# Patient Record
Sex: Female | Born: 1965 | Race: White | Hispanic: No | Marital: Married | Smoking: Never smoker
Health system: Southern US, Community
[De-identification: ages and names within clinical notes are randomized; demographics above are authoritative.]

## PROBLEM LIST (undated history)

## (undated) DIAGNOSIS — R0602 Shortness of breath: Secondary | ICD-10-CM

## (undated) DIAGNOSIS — I10 Essential (primary) hypertension: Secondary | ICD-10-CM

## (undated) DIAGNOSIS — I700 Atherosclerosis of aorta: Secondary | ICD-10-CM

## (undated) DIAGNOSIS — K219 Gastro-esophageal reflux disease without esophagitis: Secondary | ICD-10-CM

## (undated) DIAGNOSIS — M549 Dorsalgia, unspecified: Secondary | ICD-10-CM

## (undated) DIAGNOSIS — F419 Anxiety disorder, unspecified: Secondary | ICD-10-CM

## (undated) DIAGNOSIS — N950 Postmenopausal bleeding: Secondary | ICD-10-CM

## (undated) DIAGNOSIS — R600 Localized edema: Secondary | ICD-10-CM

## (undated) DIAGNOSIS — M25569 Pain in unspecified knee: Secondary | ICD-10-CM

## (undated) DIAGNOSIS — F32A Depression, unspecified: Secondary | ICD-10-CM

## (undated) DIAGNOSIS — K5790 Diverticulosis of intestine, part unspecified, without perforation or abscess without bleeding: Secondary | ICD-10-CM

## (undated) DIAGNOSIS — R0600 Dyspnea, unspecified: Secondary | ICD-10-CM

## (undated) DIAGNOSIS — G4730 Sleep apnea, unspecified: Secondary | ICD-10-CM

## (undated) DIAGNOSIS — C50919 Malignant neoplasm of unspecified site of unspecified female breast: Secondary | ICD-10-CM

## (undated) DIAGNOSIS — K449 Diaphragmatic hernia without obstruction or gangrene: Secondary | ICD-10-CM

## (undated) DIAGNOSIS — E7800 Pure hypercholesterolemia, unspecified: Secondary | ICD-10-CM

## (undated) DIAGNOSIS — K429 Umbilical hernia without obstruction or gangrene: Secondary | ICD-10-CM

## (undated) DIAGNOSIS — K5900 Constipation, unspecified: Secondary | ICD-10-CM

## (undated) DIAGNOSIS — Z6841 Body Mass Index (BMI) 40.0 and over, adult: Secondary | ICD-10-CM

## (undated) DIAGNOSIS — R7303 Prediabetes: Secondary | ICD-10-CM

## (undated) DIAGNOSIS — R4586 Emotional lability: Secondary | ICD-10-CM

## (undated) DIAGNOSIS — Z973 Presence of spectacles and contact lenses: Secondary | ICD-10-CM

## (undated) DIAGNOSIS — M2550 Pain in unspecified joint: Secondary | ICD-10-CM

## (undated) DIAGNOSIS — E559 Vitamin D deficiency, unspecified: Secondary | ICD-10-CM

## (undated) DIAGNOSIS — M25559 Pain in unspecified hip: Secondary | ICD-10-CM

## (undated) DIAGNOSIS — K439 Ventral hernia without obstruction or gangrene: Secondary | ICD-10-CM

## (undated) HISTORY — DX: Diverticulosis of intestine, part unspecified, without perforation or abscess without bleeding: K57.90

## (undated) HISTORY — DX: Emotional lability: R45.86

## (undated) HISTORY — PX: WISDOM TOOTH EXTRACTION: SHX21

## (undated) HISTORY — DX: Atherosclerosis of aorta: I70.0

## (undated) HISTORY — DX: Shortness of breath: R06.02

## (undated) HISTORY — DX: Dorsalgia, unspecified: M54.9

## (undated) HISTORY — DX: Constipation, unspecified: K59.00

## (undated) HISTORY — DX: Diaphragmatic hernia without obstruction or gangrene: K44.9

## (undated) HISTORY — DX: Vitamin D deficiency, unspecified: E55.9

## (undated) HISTORY — DX: Pain in unspecified hip: M25.559

## (undated) HISTORY — DX: Malignant neoplasm of unspecified site of unspecified female breast: C50.919

## (undated) HISTORY — DX: Pain in unspecified joint: M25.50

## (undated) HISTORY — DX: Ventral hernia without obstruction or gangrene: K43.9

## (undated) HISTORY — DX: Pain in unspecified knee: M25.569

## (undated) HISTORY — DX: Depression, unspecified: F32.A

## (undated) HISTORY — PX: COLONOSCOPY: SHX174

## (undated) HISTORY — DX: Localized edema: R60.0

## (undated) HISTORY — DX: Prediabetes: R73.03

## (undated) SURGERY — COLONOSCOPY WITH PROPOFOL
Anesthesia: Monitor Anesthesia Care

---

## 2019-01-13 NOTE — H&P (Addendum)
Chelsea Soto is an 53 y.o. female presents for surgical evaluation of PMB.  No pelvic pain.  No f/c.     Menstrual History: No LMP recorded. - menopause    PMHx:  Hypercholesterolemia, HTN  PSHx:  None  SHx:  No tobacco or etoh use  Social History:  has no history on file for tobacco, alcohol, and drug.  Allergies: No Known Allergies   Meds:  Lisinopril, rosuvastatin  AF, VSS Physical Exam  Gen - NAD Abd - soft, obese, NT PV - limited by habitus.    PV [place]: small intramural fibroids, larges 2.6cm.  EMS 7.6cm.  No adnexal masses   Assessment/Plan:  PMB Hysteroscopy, D&C, possible resection of endometrium R/b/a discussed, questions answered, informed consent  Chelsea Soto 01/13/2019, 8:31 AM

## 2019-01-15 NOTE — Pre-Procedure Instructions (Signed)
[id number] - [place], [place] - [address] [place] [address] Phone: [phone] Fax: [phone]      Your procedure is scheduled on Thursday, October, 22nd.  Report to [facility] Main Entrance "A" at 0530 A.M., and check in at the Admitting office.  Call this number if you have problems the morning of surgery:  [phone]  Call [phone] if you have any questions prior to your surgery date Monday-Friday 8am-4pm    Remember:  Do not eat or drink after midnight the night before your surgery  You may drink clear liquids until 0430 AM the morning of your surgery.   Clear liquids allowed are: Water, Non-Citrus Juices (without pulp), Carbonated Beverages, Clear Tea, Black Coffee Only, and Gatorade  Please complete your PRE-SURGERY ENSURE that was provided to you by 0430 AM the morning of surgery.  Please, if able, drink it in one setting. DO NOT SIP.     Take these medicines the morning of surgery with A SIP OF WATER: NONE.   As of today, STOP taking any Aspirin (unless otherwise instructed by your surgeon), NSAIDs such as, meloxicam (MOBIC), Aleve, Naproxen, Ibuprofen, Motrin, Advil, Goody's, BC's, all herbal medications, fish oil, and all vitamins.    The Morning of Surgery  Do not wear jewelry, make-up or nail polish.  Do not wear lotions, powders, perfumes, or deodorant  Do not shave 48 hours prior to surgery.    Do not bring valuables to the hospital.  [facility] is not responsible for any belongings or valuables.  If you are a smoker, DO NOT Smoke 24 hours prior to surgery IF you wear a CPAP at night please bring your mask, tubing, and machine the morning of surgery   Remember that you must have someone to transport you home after your surgery, and remain with you for 24 hours if you are discharged the same day.   Contacts, glasses, hearing aids, dentures or bridgework may not be worn into  surgery.    Leave your suitcase in the car.  After surgery it may be brought to your room.  For patients admitted to the hospital, discharge time will be determined by your treatment team.  Patients discharged the day of surgery will not be allowed to drive home.    Special instructions:   [place]- Preparing For Surgery  Before surgery, you can play an important role. Because skin is not sterile, your skin needs to be as free of germs as possible. You can reduce the number of germs on your skin by washing with CHG (chlorahexidine gluconate) Soap before surgery.  CHG is an antiseptic cleaner which kills germs and bonds with the skin to continue killing germs even after washing.    Oral Hygiene is also important to reduce your risk of infection.  Remember - BRUSH YOUR TEETH THE MORNING OF SURGERY WITH YOUR REGULAR TOOTHPASTE  Please do not use if you have an allergy to CHG or antibacterial soaps. If your skin becomes reddened/irritated stop using the CHG.  Do not shave (including legs and underarms) for at least 48 hours prior to first CHG shower. It is OK to shave your face.  Please follow these instructions carefully.   1. Shower the NIGHT BEFORE SURGERY and the MORNING OF SURGERY with CHG Soap.   2. If you chose to wash your hair, wash your hair first as usual with your normal shampoo.  3. After you shampoo, rinse your hair and body thoroughly to remove the shampoo.  4. Use CHG as you would any other liquid soap. You can apply CHG directly to the skin and wash gently with a scrungie or a clean washcloth.   5. Apply the CHG Soap to your body ONLY FROM THE NECK DOWN.  Do not use on open wounds or open sores. Avoid contact with your eyes, ears, mouth and genitals (private parts). Wash Face and genitals (private parts)  with your normal soap.   6. Wash thoroughly, paying special attention to the area where your surgery will be performed.  7. Thoroughly rinse your body with warm  water from the neck down.  8. DO NOT shower/wash with your normal soap after using and rinsing off the CHG Soap.  9. Pat yourself dry with a CLEAN TOWEL.  10. Wear CLEAN PAJAMAS to bed the night before surgery, wear comfortable clothes the morning of surgery  11. Place CLEAN SHEETS on your bed the night of your first shower and DO NOT SLEEP WITH PETS.    Day of Surgery:  Do not apply any deodorants/lotions. Please shower the morning of surgery with the CHG soap  Please wear clean clothes to the hospital/surgery center.   Remember to brush your teeth WITH YOUR REGULAR TOOTHPASTE.   Please read over the following fact sheets that you were given.

## 2019-01-18 ENCOUNTER — Other Ambulatory Visit: Payer: Self-pay

## 2019-01-18 ENCOUNTER — Other Ambulatory Visit (HOSPITAL_COMMUNITY)
Admission: RE | Admit: 2019-01-18 | Discharge: 2019-01-18 | Disposition: A | Discharge status: Home / Self Care | Payer: 59 | Source: Ambulatory Visit | Attending: Obstetrics and Gynecology | Admitting: Obstetrics and Gynecology

## 2019-01-18 ENCOUNTER — Encounter (HOSPITAL_COMMUNITY): Payer: Self-pay

## 2019-01-18 ENCOUNTER — Encounter (HOSPITAL_COMMUNITY)
Admission: RE | Admit: 2019-01-18 | Discharge: 2019-01-18 | Disposition: A | Discharge status: Home / Self Care | Payer: 59 | Source: Ambulatory Visit | Attending: Obstetrics and Gynecology | Admitting: Obstetrics and Gynecology

## 2019-01-18 DIAGNOSIS — Z01812 Encounter for preprocedural laboratory examination: Secondary | ICD-10-CM | POA: Insufficient documentation

## 2019-01-18 DIAGNOSIS — Z20828 Contact with and (suspected) exposure to other viral communicable diseases: Secondary | ICD-10-CM | POA: Insufficient documentation

## 2019-01-18 DIAGNOSIS — Z01810 Encounter for preprocedural cardiovascular examination: Secondary | ICD-10-CM | POA: Insufficient documentation

## 2019-01-18 HISTORY — DX: Pure hypercholesterolemia, unspecified: E78.00

## 2019-01-18 HISTORY — DX: Presence of spectacles and contact lenses: Z97.3

## 2019-01-18 HISTORY — DX: Anxiety disorder, unspecified: F41.9

## 2019-01-18 HISTORY — DX: Essential (primary) hypertension: I10

## 2019-01-18 HISTORY — DX: Postmenopausal bleeding: N95.0

## 2019-01-18 HISTORY — DX: Gastro-esophageal reflux disease without esophagitis: K21.9

## 2019-01-18 HISTORY — DX: Sleep apnea, unspecified: G47.30

## 2019-01-18 HISTORY — DX: Body Mass Index (BMI) 40.0 and over, adult: Z684

## 2019-01-18 HISTORY — DX: Umbilical hernia without obstruction or gangrene: K42.9

## 2019-01-18 HISTORY — DX: Dyspnea, unspecified: R06.00

## 2019-01-18 HISTORY — DX: Morbid (severe) obesity due to excess calories: E66.01

## 2019-01-18 LAB — CBC
HCT: 41.3 % (ref 36.0–46.0)
Hemoglobin: 13.1 g/dL (ref 12.0–15.0)
MCH: 27.9 pg (ref 26.0–34.0)
MCHC: 31.7 g/dL (ref 30.0–36.0)
MCV: 88.1 fL (ref 80.0–100.0)
Platelets: 270 10*3/uL (ref 150–400)
RBC: 4.69 MIL/uL (ref 3.87–5.11)
RDW: 14.7 % (ref 11.5–15.5)
WBC: 7.9 10*3/uL (ref 4.0–10.5)
nRBC: 0 % (ref 0.0–0.2)

## 2019-01-18 LAB — BASIC METABOLIC PANEL
Anion gap: 10 (ref 5–15)
BUN: 17 mg/dL (ref 6–20)
CO2: 21 mmol/L — ABNORMAL LOW (ref 22–32)
Calcium: 9.1 mg/dL (ref 8.9–10.3)
Chloride: 107 mmol/L (ref 98–111)
Creatinine, Ser: 0.51 mg/dL (ref 0.44–1.00)
GFR calc Af Amer: >60 mL/min (ref >60)
GFR calc non Af Amer: >60 mL/min (ref >60)
Glucose, Bld: 113 mg/dL — ABNORMAL HIGH (ref 70–99)
Potassium: 4.2 mmol/L (ref 3.5–5.1)
Sodium: 138 mmol/L (ref 135–145)

## 2019-01-18 LAB — TYPE AND SCREEN
ABO/RH(D): O POS
Antibody Screen: NEGATIVE

## 2019-01-18 LAB — ABO/RH: ABO/RH(D): O POS

## 2019-01-18 NOTE — Progress Notes (Signed)
Pt denies any acute cardiopulmonary issues.  Pt denies being under the care of a cardiologist. Pt denies having a cardiac cath but stated that a stress test was performed and an echo "  may have been performed "  at [facility] by Dr. Farley Ly, in [place], [place]. Nurse requested LOV note, stress test, echo, EKG and any cardiac records; nurse awaiting response. Pt denies having an EKG and chest x ray in the last year. Pt denies recent labs. Pt also made aware to stop taking Melatonin MZS Sleep Disorder (Melatonin 3mg -Zinc 8.7 mg-Selenium 50 mcg). Pt verbalized understanding of all pre-op instructions. Pt chart forwarded to PA, Anesthesiology, to review cardiac studies when received.

## 2019-01-18 NOTE — Progress Notes (Signed)
Pt stated that PCP is Dr. Fonnie Jarvis.

## 2019-01-19 LAB — NOVEL CORONAVIRUS, NAA (HOSP ORDER, SEND-OUT TO REF LAB; TAT 18-24 HRS): SARS-CoV-2, NAA: NOT DETECTED

## 2019-01-20 ENCOUNTER — Encounter (HOSPITAL_COMMUNITY): Payer: Self-pay

## 2019-01-20 NOTE — Anesthesia Preprocedure Evaluation (Addendum)
Anesthesia Evaluation  Patient identified by MRN, date of birth, ID band Patient awake    Reviewed: Allergy & Precautions, H&P , NPO status , Patient's Chart, lab work & pertinent test results  Airway Mallampati: IV  TM Distance: >3 FB Neck ROM: Full    Dental no notable dental hx. (+) Teeth Intact, Dental Advisory Given   Pulmonary neg pulmonary ROS, sleep apnea and Continuous Positive Airway Pressure Ventilation ,    Pulmonary exam normal breath sounds clear to auscultation       Cardiovascular Exercise Tolerance: Good hypertension, Pt. on medications negative cardio ROS Normal cardiovascular exam Rhythm:Regular Rate:Normal     Neuro/Psych negative neurological ROS  negative psych ROS   GI/Hepatic negative GI ROS, Neg liver ROS, GERD  Medicated,  Endo/Other  negative endocrine ROSMorbid obesity  Renal/GU negative Renal ROS  negative genitourinary   Musculoskeletal negative musculoskeletal ROS (+)   Abdominal (+) + obese,   Peds negative pediatric ROS (+)  Hematology negative hematology ROS (+)   Anesthesia Other Findings   Reproductive/Obstetrics negative OB ROS                           Anesthesia Physical Anesthesia Plan  ASA: III  Anesthesia Plan: General   Post-op Pain Management:    Induction: Intravenous  PONV Risk Score and Plan: 3 and Ondansetron, Dexamethasone, Treatment may vary due to age or medical condition and TIVA  Airway Management Planned: LMA, Oral ETT and Video Laryngoscope Planned  Additional Equipment:   Intra-op Plan:   Post-operative Plan:   Informed Consent: I have reviewed the patients History and Physical, chart, labs and discussed the procedure including the risks, benefits and alternatives for the proposed anesthesia with the patient or authorized representative who has indicated his/her understanding and acceptance.       Plan Discussed  with: CRNA, Surgeon and Anesthesiologist  Anesthesia Plan Comments: (Severe OSA in BiPAP, followed by pulmonology at [facility].  Pt reported hx of stress test, possibly echo that were done in [place]. Records were requested from [facility] via fax and phone, as of yet no records have been received.  Preop labs reviewed, WNL. EKG 01/18/19 shows NSR, rate 78.  )    Anesthesia Quick Evaluation

## 2019-01-21 ENCOUNTER — Other Ambulatory Visit: Payer: Self-pay

## 2019-01-21 ENCOUNTER — Ambulatory Visit (HOSPITAL_COMMUNITY): Payer: 59 | Admitting: Anesthesiology

## 2019-01-21 ENCOUNTER — Ambulatory Visit (HOSPITAL_COMMUNITY)
Admission: RE | Admit: 2019-01-21 | Discharge: 2019-01-21 | Disposition: A | Discharge status: Home / Self Care | Payer: 59 | Attending: Obstetrics and Gynecology | Admitting: Obstetrics and Gynecology

## 2019-01-21 ENCOUNTER — Encounter (HOSPITAL_COMMUNITY): Payer: Self-pay

## 2019-01-21 ENCOUNTER — Encounter (HOSPITAL_COMMUNITY)
Admission: RE | Disposition: A | Discharge status: Home / Self Care | Payer: Self-pay | Source: Home / Self Care | Attending: Obstetrics and Gynecology

## 2019-01-21 ENCOUNTER — Ambulatory Visit (HOSPITAL_COMMUNITY): Payer: 59 | Admitting: Physician Assistant

## 2019-01-21 DIAGNOSIS — E669 Obesity, unspecified: Secondary | ICD-10-CM | POA: Insufficient documentation

## 2019-01-21 DIAGNOSIS — N840 Polyp of corpus uteri: Secondary | ICD-10-CM | POA: Insufficient documentation

## 2019-01-21 DIAGNOSIS — D251 Intramural leiomyoma of uterus: Secondary | ICD-10-CM | POA: Diagnosis not present

## 2019-01-21 DIAGNOSIS — Z6841 Body Mass Index (BMI) 40.0 and over, adult: Secondary | ICD-10-CM | POA: Diagnosis not present

## 2019-01-21 DIAGNOSIS — I10 Essential (primary) hypertension: Secondary | ICD-10-CM | POA: Insufficient documentation

## 2019-01-21 DIAGNOSIS — E7800 Pure hypercholesterolemia, unspecified: Secondary | ICD-10-CM | POA: Diagnosis not present

## 2019-01-21 DIAGNOSIS — N950 Postmenopausal bleeding: Secondary | ICD-10-CM | POA: Insufficient documentation

## 2019-01-21 DIAGNOSIS — G4730 Sleep apnea, unspecified: Secondary | ICD-10-CM | POA: Diagnosis not present

## 2019-01-21 DIAGNOSIS — K219 Gastro-esophageal reflux disease without esophagitis: Secondary | ICD-10-CM | POA: Insufficient documentation

## 2019-01-21 DIAGNOSIS — Z79899 Other long term (current) drug therapy: Secondary | ICD-10-CM | POA: Diagnosis not present

## 2019-01-21 HISTORY — PX: DILATATION & CURETTAGE/HYSTEROSCOPY WITH MYOSURE: SHX6511

## 2019-01-21 SURGERY — DILATATION & CURETTAGE/HYSTEROSCOPY WITH MYOSURE
Anesthesia: General | Site: Vagina

## 2019-01-21 MED ORDER — MIDAZOLAM HCL 2 MG/2ML IJ SOLN
INTRAMUSCULAR | Status: AC
  Filled 2019-01-21: qty 2

## 2019-01-21 MED ORDER — MEPERIDINE HCL 25 MG/ML IJ SOLN: 6.2500 mg | INTRAMUSCULAR | Status: DC | PRN

## 2019-01-21 MED ORDER — OXYCODONE HCL 5 MG PO TABS: 5.0000 mg | ORAL_TABLET | Freq: Once | ORAL | Status: DC | PRN

## 2019-01-21 MED ORDER — ONDANSETRON HCL 4 MG/2ML IJ SOLN: 4.0000 mg | Freq: Once | INTRAMUSCULAR | Status: DC | PRN

## 2019-01-21 MED ORDER — LIDOCAINE HCL 1 % IJ SOLN
INTRAMUSCULAR | Status: AC
  Filled 2019-01-21: qty 20

## 2019-01-21 MED ORDER — ACETAMINOPHEN 10 MG/ML IV SOLN
INTRAVENOUS | Status: AC
  Filled 2019-01-21: qty 100

## 2019-01-21 MED ORDER — LIDOCAINE 2% (20 MG/ML) 5 ML SYRINGE
INTRAMUSCULAR | Status: AC
  Filled 2019-01-21: qty 5

## 2019-01-21 MED ORDER — DEXTROSE 5 % IV SOLN
INTRAVENOUS | Status: DC | PRN
  Administered 2019-01-21: 3 g via INTRAVENOUS

## 2019-01-21 MED ORDER — PROPOFOL 10 MG/ML IV BOLUS
INTRAVENOUS | Status: AC
  Filled 2019-01-21: qty 20

## 2019-01-21 MED ORDER — LACTATED RINGERS IV SOLN
INTRAVENOUS | Status: DC
  Administered 2019-01-21: 07:00:00 via INTRAVENOUS

## 2019-01-21 MED ORDER — FENTANYL CITRATE (PF) 250 MCG/5ML IJ SOLN
INTRAMUSCULAR | Status: AC
  Filled 2019-01-21: qty 5

## 2019-01-21 MED ORDER — PROPOFOL 10 MG/ML IV BOLUS
INTRAVENOUS | Status: DC | PRN
  Administered 2019-01-21: 200 mg via INTRAVENOUS

## 2019-01-21 MED ORDER — ACETAMINOPHEN 160 MG/5ML PO SOLN: 325.0000 mg | ORAL | Status: DC | PRN

## 2019-01-21 MED ORDER — ONDANSETRON HCL 4 MG/2ML IJ SOLN
INTRAMUSCULAR | Status: AC
  Filled 2019-01-21: qty 2

## 2019-01-21 MED ORDER — ONDANSETRON HCL 4 MG/2ML IJ SOLN
INTRAMUSCULAR | Status: DC | PRN
  Administered 2019-01-21: 4 mg via INTRAVENOUS

## 2019-01-21 MED ORDER — DEXAMETHASONE SODIUM PHOSPHATE 10 MG/ML IJ SOLN
INTRAMUSCULAR | Status: AC
  Filled 2019-01-21: qty 1

## 2019-01-21 MED ORDER — FENTANYL CITRATE (PF) 100 MCG/2ML IJ SOLN
INTRAMUSCULAR | Status: DC | PRN
  Administered 2019-01-21 (×4): 25 ug via INTRAVENOUS

## 2019-01-21 MED ORDER — ACETAMINOPHEN 325 MG PO TABS: 325.0000 mg | ORAL_TABLET | ORAL | Status: DC | PRN

## 2019-01-21 MED ORDER — OXYCODONE HCL 5 MG/5ML PO SOLN: 5.0000 mg | Freq: Once | ORAL | Status: DC | PRN

## 2019-01-21 MED ORDER — ACETAMINOPHEN 10 MG/ML IV SOLN
INTRAVENOUS | Status: DC | PRN
  Administered 2019-01-21: 1000 mg via INTRAVENOUS

## 2019-01-21 MED ORDER — LIDOCAINE HCL 1 % IJ SOLN
INTRAMUSCULAR | Status: DC | PRN
  Administered 2019-01-21: 10 mL

## 2019-01-21 MED ORDER — FENTANYL CITRATE (PF) 100 MCG/2ML IJ SOLN: 25.0000 ug | INTRAMUSCULAR | Status: DC | PRN

## 2019-01-21 MED ORDER — OXYCODONE-ACETAMINOPHEN 5-325 MG PO TABS: 1.0000 | ORAL_TABLET | ORAL | 0 refills | Status: DC | PRN

## 2019-01-21 MED ORDER — SODIUM CHLORIDE 0.9 % IR SOLN
Status: DC | PRN
  Administered 2019-01-21: 3000 mL

## 2019-01-21 MED ORDER — PHENYLEPHRINE HCL (PRESSORS) 10 MG/ML IV SOLN
INTRAVENOUS | Status: DC | PRN
  Administered 2019-01-21 (×3): 80 ug via INTRAVENOUS

## 2019-01-21 MED ORDER — LIDOCAINE 2% (20 MG/ML) 5 ML SYRINGE
INTRAMUSCULAR | Status: DC | PRN
  Administered 2019-01-21: 100 mg via INTRAVENOUS

## 2019-01-21 SURGICAL SUPPLY — 15 items
CANISTER SUCT 3000ML PPV (MISCELLANEOUS) ×2 IMPLANT
CATH ROBINSON RED A/P 16FR (CATHETERS) ×2 IMPLANT
DEVICE MYOSURE LITE (MISCELLANEOUS) ×2 IMPLANT
DEVICE MYOSURE REACH (MISCELLANEOUS) IMPLANT
GLOVE BIO SURGEON STRL SZ 6.5 (GLOVE) ×2 IMPLANT
GLOVE BIOGEL PI IND STRL 7.0 (GLOVE) ×2 IMPLANT
GLOVE BIOGEL PI INDICATOR 7.0 (GLOVE) ×2
GOWN STRL REUS W/ TWL LRG LVL3 (GOWN DISPOSABLE) ×2 IMPLANT
GOWN STRL REUS W/TWL LRG LVL3 (GOWN DISPOSABLE) ×2
KIT PROCEDURE FLUENT (KITS) ×2 IMPLANT
KIT TURNOVER KIT B (KITS) ×2 IMPLANT
PACK VAGINAL MINOR WOMEN LF (CUSTOM PROCEDURE TRAY) ×2 IMPLANT
PAD OB MATERNITY 4.3X12.25 (PERSONAL CARE ITEMS) ×2 IMPLANT
SEAL ROD LENS SCOPE MYOSURE (ABLATOR) ×2 IMPLANT
TOWEL GREEN STERILE FF (TOWEL DISPOSABLE) ×4 IMPLANT

## 2019-01-21 NOTE — Anesthesia Postprocedure Evaluation (Signed)
Anesthesia Post Note  Patient: Chelsea Soto  Procedure(s) Performed: DILATATION & CURETTAGE/HYSTEROSCOPY WITH possible  MYOSURE (N/A Vagina )     Patient location during evaluation: PACU Anesthesia Type: General Level of consciousness: awake and alert Pain management: pain level controlled Vital Signs Assessment: post-procedure vital signs reviewed and stable Respiratory status: spontaneous breathing, nonlabored ventilation, respiratory function stable and patient connected to nasal cannula oxygen Cardiovascular status: blood pressure returned to baseline and stable Postop Assessment: no apparent nausea or vomiting Anesthetic complications: no    Last Vitals:  Vitals:   01/21/19 0836 01/21/19 0837  BP: (!) 121/50   Pulse: 69 68  Resp: 20 16  Temp:  36.7 C  SpO2: 94% 96%    Last Pain:  Vitals:   01/21/19 0837  PainSc: 0-No pain                 Anneliese Leblond

## 2019-01-21 NOTE — Anesthesia Procedure Notes (Signed)
Procedure Name: LMA Insertion Date/Time: 01/21/2019 7:28 AM Performed by: Amadeo Garnet, CRNA Pre-anesthesia Checklist: Patient identified, Emergency Drugs available, Suction available, Patient being monitored and Timeout performed Patient Re-evaluated:Patient Re-evaluated prior to induction Oxygen Delivery Method: Circle system utilized Preoxygenation: Pre-oxygenation with 100% oxygen Induction Type: IV induction Ventilation: Mask ventilation without difficulty LMA: LMA inserted LMA Size: 4.0 Number of attempts: 1 Placement Confirmation: ETT inserted through vocal cords under direct vision,  positive ETCO2 and breath sounds checked- equal and bilateral Dental Injury: Teeth and Oropharynx as per pre-operative assessment

## 2019-01-21 NOTE — Transfer of Care (Signed)
Immediate Anesthesia Transfer of Care Note  Patient: Chelsea Soto  Procedure(s) Performed: DILATATION & CURETTAGE/HYSTEROSCOPY WITH possible  MYOSURE (N/A Vagina )  Patient Location: PACU  Anesthesia Type:General  Level of Consciousness: awake, alert  and oriented  Airway & Oxygen Therapy: Patient Spontanous Breathing  Post-op Assessment: Report given to RN, Post -op Vital signs reviewed and stable and Patient moving all extremities  Post vital signs: Reviewed and stable  Last Vitals:  Vitals Value Taken Time  BP 111/61 01/21/19 0806  Temp    Pulse 74 01/21/19 0807  Resp 22 01/21/19 0807  SpO2 96 % 01/21/19 0807  Vitals shown include unvalidated device data.  Last Pain:  Vitals:   01/21/19 0606  PainSc: 0-No pain         Complications: No apparent anesthesia complications

## 2019-01-21 NOTE — Op Note (Signed)
Pre op dx: postmenopausal bleeding, endometrial mass  Post op dx:  same  Procedure:  Paracervical block, Hysteroscopy, D&C, polypectomy  Surgeon:  [name], MD  EBL:  minimal  Specimen:  EMC, endometrial polyp  Anesthesia:  GETA, local  Complications:  none  Condition:  Pt was taken to the OR after informed consent.  Anesthesia was given and she was placed in dorsal lithotomy position.  Prepped and draped in sterile condition.  Bivalve speculum placed in vaginal and single tooth tenaculum placed on anterior lip of cervix.  10 cc lidocaine used for block.  Cervix was serially dilated with pratt dilators.  Diagnostic hysteroscope inserted and survey of intrauterine cavity performed.  2 polypoid masses noted.  Myosure was used to resect polyps.  Hysteroscope removed and a gentle curetting performed.  Specimen placed on telfa and passed off to be sent to pathology.    Sponge, needle, and instrument counts correct.

## 2019-01-22 ENCOUNTER — Encounter (HOSPITAL_COMMUNITY): Payer: Self-pay | Admitting: Obstetrics and Gynecology

## 2019-01-22 LAB — SURGICAL PATHOLOGY

## 2019-04-30 LAB — COLOGUARD: COLOGUARD: NEGATIVE

## 2020-06-02 ENCOUNTER — Telehealth: Payer: Self-pay | Admitting: Allergy & Immunology

## 2020-06-02 ENCOUNTER — Other Ambulatory Visit (HOSPITAL_COMMUNITY): Payer: Self-pay | Admitting: Allergy & Immunology

## 2020-06-02 MED ORDER — MOLNUPIRAVIR EUA 200MG CAPSULE: 4.0000 | ORAL_CAPSULE | Freq: Two times a day (BID) | ORAL | 0 refills | Status: AC

## 2020-06-02 MED ORDER — MOLNUPIRAVIR EUA 200MG CAPSULE: 4.0000 | ORAL_CAPSULE | Freq: Two times a day (BID) | ORAL | 0 refills | Status: DC

## 2020-06-02 NOTE — Telephone Encounter (Signed)
I received a call from Ms. Bennison reporting that she was exposed to Oliver. Given her high risk status, I recommended that she start molnupiravir treatment. I am sending this to the [facility]   Salvatore Marvel, MD [facility] [place] [facility] [place]

## 2020-06-27 ENCOUNTER — Ambulatory Visit (INDEPENDENT_AMBULATORY_CARE_PROVIDER_SITE_OTHER): Payer: 59 | Admitting: Family Medicine

## 2020-06-27 ENCOUNTER — Encounter (INDEPENDENT_AMBULATORY_CARE_PROVIDER_SITE_OTHER): Payer: Self-pay | Admitting: Family Medicine

## 2020-06-27 ENCOUNTER — Other Ambulatory Visit: Payer: Self-pay

## 2020-06-27 VITALS — BP 122/80 | HR 72 | Temp 98.0°F | Ht 63.0 in | Wt 342.0 lb

## 2020-06-27 DIAGNOSIS — Z9189 Other specified personal risk factors, not elsewhere classified: Secondary | ICD-10-CM | POA: Diagnosis not present

## 2020-06-27 DIAGNOSIS — E7849 Other hyperlipidemia: Secondary | ICD-10-CM

## 2020-06-27 DIAGNOSIS — Z1331 Encounter for screening for depression: Secondary | ICD-10-CM

## 2020-06-27 DIAGNOSIS — R7303 Prediabetes: Secondary | ICD-10-CM

## 2020-06-27 DIAGNOSIS — R0602 Shortness of breath: Secondary | ICD-10-CM

## 2020-06-27 DIAGNOSIS — E559 Vitamin D deficiency, unspecified: Secondary | ICD-10-CM | POA: Diagnosis not present

## 2020-06-27 DIAGNOSIS — I10 Essential (primary) hypertension: Secondary | ICD-10-CM

## 2020-06-27 DIAGNOSIS — Z0289 Encounter for other administrative examinations: Secondary | ICD-10-CM

## 2020-06-27 DIAGNOSIS — R5383 Other fatigue: Secondary | ICD-10-CM | POA: Diagnosis not present

## 2020-06-27 DIAGNOSIS — G4733 Obstructive sleep apnea (adult) (pediatric): Secondary | ICD-10-CM

## 2020-06-27 DIAGNOSIS — Z6841 Body Mass Index (BMI) 40.0 and over, adult: Secondary | ICD-10-CM

## 2020-06-27 DIAGNOSIS — F39 Unspecified mood [affective] disorder: Secondary | ICD-10-CM
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
## 2020-06-28 ENCOUNTER — Telehealth (INDEPENDENT_AMBULATORY_CARE_PROVIDER_SITE_OTHER): Payer: 59 | Admitting: Psychology

## 2020-06-28 DIAGNOSIS — F3289 Other specified depressive episodes: Secondary | ICD-10-CM

## 2020-06-28 LAB — COMPREHENSIVE METABOLIC PANEL
ALT: 21 IU/L (ref 0–32)
AST: 18 IU/L (ref 0–40)
Albumin/Globulin Ratio: 1.4 (ref 1.2–2.2)
Albumin: 4.2 g/dL (ref 3.8–4.9)
Alkaline Phosphatase: 119 IU/L (ref 44–121)
BUN/Creatinine Ratio: 18 (ref 9–23)
BUN: 11 mg/dL (ref 6–24)
Bilirubin Total: 0.4 mg/dL (ref 0.0–1.2)
CO2: 25 mmol/L (ref 20–29)
Calcium: 9.4 mg/dL (ref 8.7–10.2)
Chloride: 99 mmol/L (ref 96–106)
Creatinine, Ser: 0.6 mg/dL (ref 0.57–1.00)
Globulin, Total: 3.1 g/dL (ref 1.5–4.5)
Glucose: 98 mg/dL (ref 65–99)
Potassium: 4.2 mmol/L (ref 3.5–5.2)
Sodium: 140 mmol/L (ref 134–144)
Total Protein: 7.3 g/dL (ref 6.0–8.5)
eGFR: 105 mL/min/{1.73_m2} (ref >59)

## 2020-06-28 LAB — CBC WITH DIFFERENTIAL/PLATELET
Basophils Absolute: 0.1 10*3/uL (ref 0.0–0.2)
Basos: 1 %
EOS (ABSOLUTE): 0.3 10*3/uL (ref 0.0–0.4)
Eos: 4 %
Hematocrit: 41.1 % (ref 34.0–46.6)
Hemoglobin: 13.5 g/dL (ref 11.1–15.9)
Immature Grans (Abs): 0 10*3/uL (ref 0.0–0.1)
Immature Granulocytes: 0 %
Lymphocytes Absolute: 3 10*3/uL (ref 0.7–3.1)
Lymphs: 36 %
MCH: 27.8 pg (ref 26.6–33.0)
MCHC: 32.8 g/dL (ref 31.5–35.7)
MCV: 85 fL (ref 79–97)
Monocytes Absolute: 0.5 10*3/uL (ref 0.1–0.9)
Monocytes: 6 %
Neutrophils Absolute: 4.4 10*3/uL (ref 1.4–7.0)
Neutrophils: 53 %
Platelets: 337 10*3/uL (ref 150–450)
RBC: 4.86 x10E6/uL (ref 3.77–5.28)
RDW: 13.8 % (ref 11.7–15.4)
WBC: 8.3 10*3/uL (ref 3.4–10.8)

## 2020-06-28 LAB — VITAMIN D 25 HYDROXY (VIT D DEFICIENCY, FRACTURES): Vit D, 25-Hydroxy: 53.3 ng/mL (ref 30.0–100.0)

## 2020-06-28 LAB — VITAMIN B12: Vitamin B-12: 324 pg/mL (ref 232–1245)

## 2020-06-28 LAB — LIPID PANEL
Chol/HDL Ratio: 3.9 ratio (ref 0.0–4.4)
Cholesterol, Total: 148 mg/dL (ref 100–199)
HDL: 38 mg/dL — ABNORMAL LOW (ref >39)
LDL Chol Calc (NIH): 85 mg/dL (ref 0–99)
Triglycerides: 141 mg/dL (ref 0–149)
VLDL Cholesterol Cal: 25 mg/dL (ref 5–40)

## 2020-06-28 LAB — INSULIN, RANDOM: INSULIN: 19.4 u[IU]/mL (ref 2.6–24.9)

## 2020-06-28 LAB — T4, FREE: Free T4: 0.94 ng/dL (ref 0.82–1.77)

## 2020-06-28 LAB — HEMOGLOBIN A1C
Est. average glucose Bld gHb Est-mCnc: 131 mg/dL
Hgb A1c MFr Bld: 6.2 % — ABNORMAL HIGH (ref 4.8–5.6)

## 2020-06-28 LAB — TSH: TSH: 3.38 u[IU]/mL (ref 0.450–4.500)

## 2020-06-28 LAB — FOLATE: Folate: 7 ng/mL (ref >3.0)

## 2020-06-29 NOTE — Progress Notes (Signed)
  Office: [phone]  /  Fax: [phone]    Date: July 13, 2020   Appointment Start Time: 8:00am Duration: 30 minutes Provider: Glennie Isle, Psy.D. 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 female presenting for a follow-up appointment to address the previously established treatment goal of increasing coping skills. Today's appointment was a telepsychological visit due to COVID-19. Chelsea Soto provided verbal consent for today's telepsychological appointment and she is aware she is responsible for securing confidentiality on her end of the session. Prior to proceeding with today's appointment, [name]'s physical location at the time of this appointment was obtained as well a phone number she could be reached at in the event of technical difficulties. Ariely and this provider participated in today's telepsychological service. Of note, Chelsea Soto provided verbal consent at 8:03am to proceed via a regular telephone call due to challenges with video connection and then audio connection.   This provider conducted a brief check-in. [name] shared about recent events, noting some deviations from the meal plan while traveling for work. While at home, she discussed focusing on what she needed versus focusing on everyone else, which reportedly helped her follow the structured meal plan. Positive reinforcement was provided. Psychoeducation regarding triggers for emotional eating was provided. Chelsea Soto was provided a handout, and encouraged to utilize the handout between now and the next appointment to increase awareness of triggers and frequency. Chelsea Soto agreed. This provider also discussed behavioral strategies for specific triggers, such as placing the utensil down when conversing to avoid mindless eating. Chelsea Soto  provided verbal consent during today's appointment for this provider to send a handout about triggers via e-mail.  Chelsea Soto was receptive to today's appointment as evidenced by openness to sharing, responsiveness to feedback, and willingness to explore triggers for emotional eating.  Mental Status Examination:  Appearance: unable to assess  Behavior: appropriate to circumstances Mood: euthymic Affect: unable to fully assess Speech: normal in rate, volume, and tone Eye Contact: unable to assess Psychomotor Activity: unable to assess  Gait: unable to assess Thought Process: linear, logical, and goal directed  Thought Content/Perception: no hallucinations, delusions, bizarre thinking or behavior reported or observed and no evidence of suicidal and homicidal ideation, plan, and intent Orientation: time, person, place, and purpose of appointment Memory/Concentration: memory, attention, language, and fund of knowledge intact  Insight/Judgment: good  Interventions:  Conducted a brief chart review Provided empathic reflections and validation Provided positive reinforcement Employed supportive psychotherapy interventions to facilitate reduced distress and to improve coping skills with identified stressors Psychoeducation provided regarding triggers for emotional eating  DSM-5 Diagnosis(es): 311 (F32.8) Other Specified Depressive Disorder, Emotional Eating Behaviors  Treatment Goal & Progress: During the initial appointment with this provider, the following treatment goal was established: increase coping skills. [name] has demonstrated some progress in her goal as evidenced by increased awareness of hunger patterns.   Plan: The next appointment will be scheduled in two weeks, which will be via MyChart Video Visit. The next session will focus on working towards the established treatment goal. Additionally, [name] reported a plan to return Chelsea Soto phone call to schedule an  initial appointment.

## 2020-07-05 NOTE — Progress Notes (Signed)
Chief Complaint:   OBESITY Chelsea Soto (MR# [id number]) is a 55 y.o. female who presents for evaluation and treatment of obesity and related comorbidities. Current BMI is Body mass index is 60.58 kg/m. Eydie has been struggling with her weight for many years and has been unsuccessful in either losing weight, maintaining weight loss, or reaching her healthy weight goal.  Preet is currently in the action stage of change and ready to dedicate time achieving and maintaining a healthier weight. Zamariah is interested in becoming our patient and working on intensive lifestyle modifications including (but not limited to) diet and exercise for weight loss.  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  She wants to get to 150 pounds "ASAP".  She did Weight Watchers in the past and says it helped, but says "she quit".  Craves carbs, especially fried bread, pasta, and potatoes, especially at lunch.  Snacks on chips, cheese, crackers, and grapes.  Drinks a lot of caloric beverages.  Eats out a lot.  She had CMP, CBC, and A1c on 04/27/2020.  Lakesia's habits were reviewed today and are as follows: Her family eats meals together, she thinks her family will eat healthier with her, her desired weight loss is 194 pounds, she has been heavy most of her life, she started gaining excessive weight 5 years ago, her heaviest weight ever was 344 pounds, she craves bread, pasta, and potatoes, she snacks frequently in the evenings, she is frequently drinking liquids with calories, she frequently makes poor food choices, she has problems with excessive hunger, she frequently eats larger portions than normal and she struggles with emotional eating.  Depression Screen Danita's Food and Mood (modified PHQ-9) score was 23.  Depression screen [facility] 2/9 06/27/2020  Decreased Interest 3  Down, Depressed, Hopeless 3  PHQ - 2 Score 6  Altered sleeping  3  Tired, decreased energy 3  Change in appetite 3  Feeling bad or failure about yourself  3  Trouble concentrating 3  Moving slowly or fidgety/restless 1  Suicidal thoughts 1  PHQ-9 Score 23  Difficult doing work/chores Very difficult   Assessment/Plan:   Orders Placed This Encounter  Procedures  . Vitamin B12  . CBC with Differential/Platelet  . Comprehensive metabolic panel  . Folate  . Hemoglobin A1c  . Insulin, random  . Lipid panel  . T4, free  . TSH  . VITAMIN D 25 Hydroxy (Vit-D Deficiency, Fractures)    Medications Discontinued During This Encounter  Medication Reason  . clotrimazole-betamethasone (LOTRISONE) cream   . oxyCODONE-acetaminophen (PERCOCET) 5-325 MG tablet      1. Other fatigue Amilah denies daytime somnolence and denies waking up still tired. Alaija generally gets 5 or 6 hours of sleep per night, and states that she has generally restful sleep. Snoring is not present. Apneic episodes are present. Epworth Sleepiness Score is 12.  Ellaree has OSA and uses a BiPAP.  Rodolfo does feel that her weight is causing her energy to be lower than it should be. Fatigue may be related to obesity, depression or many other causes. Labs will be ordered, and in the meanwhile, Arieonna will focus on self care including making healthy food choices, increasing physical activity and focusing on stress reduction.  Will check labs today.  - Vitamin B12 - CBC with Differential/Platelet - Folate - T4, free - TSH  2. SOBOE (shortness of breath on exertion) Caren Griffins notes increasing shortness  of breath with exercising and seems to be worsening over time with weight gain. She notes getting out of breath sooner with activity than she used to. This has gotten worse recently. Iszabella denies shortness of breath at rest or orthopnea.  Ange does feel that she gets out of breath more easily that she used to when she exercises. Zakara's shortness of breath appears to be obesity  related and exercise induced. She has agreed to work on weight loss and gradually increase exercise to treat her exercise induced shortness of breath. Will continue to monitor closely.  Will check IC today.  3. Prediabetes Not at goal. Goal is HgbA1c < 5.7.  Medication: Victoza.  She was started on Victoza in August/September 2021.  A1c was 6.4 on 11/15/2019.  She has not lost any weight since starting Victoza.    Plan:  She will continue to focus on protein-rich, low simple carbohydrate foods. We reviewed the importance of hydration, regular exercise for stress reduction, and restorative sleep.  Check labs today.  Start prudent nutritional plan and weight loss.  Continue Victoza per PCP for now.  May change in the future.  - Comprehensive metabolic panel - Hemoglobin A1c - Insulin, random  4. Essential hypertension At goal. Medications: Zestoretic 20-25 mg daily.   Plan: Avoid buying foods that are: processed, frozen, or prepackaged to avoid excess salt. We will watch for signs of hypotension as she continues lifestyle modifications. We will continue to monitor closely alongside her PCP and/or Specialist.  Regular follow up with PCP and specialists was also encouraged.   BP Readings from Last 3 Encounters:  07/11/20 113/76  06/27/20 122/80  01/21/19 (!) 121/50   Lab Results  Component Value Date   CREATININE 0.60 06/27/2020   5. Other hyperlipidemia Course: Controlled. Lipid-lowering medications: Crestor 20 mg daily.   Plan: Dietary changes: Increase soluble fiber, decrease simple carbohydrates, decrease saturated fat. Exercise changes: Moderate to vigorous-intensity aerobic activity 150 minutes per week or as tolerated. We will continue to monitor along with PCP/specialists as it pertains to her weight loss journey.  Lab Results  Component Value Date   CHOL 148 06/27/2020   HDL 38 (L) 06/27/2020   LDLCALC 85 06/27/2020   TRIG 141 06/27/2020   CHOLHDL 3.9 06/27/2020   Lab  Results  Component Value Date   ALT 21 06/27/2020   AST 18 06/27/2020   ALKPHOS 119 06/27/2020   BILITOT 0.4 06/27/2020   The 10-year ASCVD risk score Mikey Bussing DC Jr., et al., 2013) is: 2.4%   Values used to calculate the score:     Age: 53 years     Sex: Female     Is Non-Hispanic African American: No     Diabetic: No     Tobacco smoker: No     Systolic Blood Pressure: 161 mmHg     Is BP treated: Yes     HDL Cholesterol: 38 mg/dL     Total Cholesterol: 148 mg/dL  - Lipid panel  6. OSA treated with BiPAP ESS is 12.  She sees a sleep medicine doctor in [place].  OSA is a cause of systemic hypertension and is associated with an increased incidence of stroke, heart failure, atrial fibrillation, and coronary heart disease. Severe OSA increases all-cause mortality and  cardiovascular mortality.  Not controlled per screening questionnaire.  Goal: Treatment of OSA via CPAP compliance and weight loss. . Plasma ghrelin levels (appetite or "hunger hormone") are significantly higher in OSA patients than in BMI-matched  controls, but decrease to levels similar to those of obese patients without OSA after CPAP treatment.  . Weight loss improves OSA by several mechanisms, including reduction in fatty tissue in the throat (i.e. parapharyngeal fat) and the tongue. Loss of abdominal fat increases mediastinal traction on the upper airway making it less likely to collapse during sleep. . Studies have also shown that compliance with CPAP treatment improves leptin (hunger inhibitory hormone) imbalance.  7. Vitamin D deficiency She is taking OTC vitamin D 5,000 IU daily.  Optimal goal > 50 ng/dL.   Plan: Continue current OTC vitamin D supplementation.  Will check vitamin D level today.  - VITAMIN D 25 Hydroxy (Vit-D Deficiency, Fractures)  8. Mood disorder, with emotional eating Not at goal. Medication: None.  With GAD and depression.  Appears well adjusted.  "Successful in life."  PHQ-9 is 23.   Denies SI.  Positive family history of mood disorder.  She sees a Social worker every 1-2 weeks.  Plan:  Behavior modification techniques were discussed today to help deal with emotional/non-hunger eating behaviors. Patient was referred to Dr. Mallie Mussel, our Bariatric Psychologist, for evaluation due to her elevated PHQ-9 score and significant struggles with emotional eating.  She will continue with her personal counselor.  9. Depression screening Ladashia was screened for depression as part of her new patient workup today.  PHQ-9 is 23.  Shontavia had a positive depression screening. Depression is commonly associated with obesity and often results in emotional eating behaviors. We will monitor this closely and work on CBT to help improve the non-hunger eating patterns. Referral to Psychology may be required if no improvement is seen as she continues in our clinic.  10. At risk for heart disease Due to Geralene's current state of health and medical condition(s), she is at a higher risk for heart disease.  This puts the patient at much greater risk to subsequently develop cardiopulmonary conditions that can significantly affect patient's quality of life in a negative manner.    At least 23 minutes were spent on counseling Aubrianna about these concerns today, and I stressed the importance of reversing risks factors of obesity, especially truncal and visceral fat, hypertension, hyperlipidemia, and pre-diabetes.  The initial goal is to lose at least 5-10% of starting weight to help reduce these risk factors.  Counseling:  Intensive lifestyle modifications were discussed with Nya as the most appropriate first line of treatment.  she will continue to work on diet, exercise, and weight loss efforts.  We will continue to reassess these conditions on a fairly regular basis in an attempt to decrease the patient's overall morbidity and mortality.  Evidence-based interventions for health behavior change were utilized  today including the discussion of self monitoring techniques, problem-solving barriers, and SMART goal setting techniques.  Specifically, regarding patient's less desirable eating habits and patterns, we employed the technique of small changes when Katilin has not been able to fully commit to her prudent nutritional plan.  11. Class 3 severe obesity with serious comorbidity and body mass index (BMI) of 60.0 to 69.9 in adult, unspecified obesity type (HCC)  Nichol is currently in the action stage of change and her goal is to continue with weight loss efforts. I recommend Harlee begin the structured treatment plan as follows:  She has agreed to the Category 2 Plan.  Exercise goals: As is.   Behavioral modification strategies: increasing lean protein intake, decreasing simple carbohydrates, increasing water intake, decreasing liquid calories, meal planning and cooking strategies and planning for  success.  She was informed of the importance of frequent follow-up visits to maximize her success with intensive lifestyle modifications for her multiple health conditions. She was informed we would discuss her lab results at her next visit unless there is a critical issue that needs to be addressed sooner. Marilee agreed to keep her next visit at the agreed upon time to discuss these results.  Objective:   Blood pressure 122/80, pulse 72, temperature 98 F (36.7 C), height 5\' 3"  (1.6 m), weight (!) 342 lb (155.1 kg), SpO2 96 %. Body mass index is 60.58 kg/m.  Indirect Calorimeter completed today shows a VO2 of 210 and a REE of 1461.  Her calculated basal metabolic rate is 1941 thus her basal metabolic rate is worse than expected.  General: Cooperative, alert, well developed, in no acute distress. HEENT: Conjunctivae and lids unremarkable. Cardiovascular: Regular rhythm.  Lungs: Normal work of breathing. Neurologic: No focal deficits.   Lab Results  Component Value Date   CREATININE 0.60  06/27/2020   BUN 11 06/27/2020   NA 140 06/27/2020   K 4.2 06/27/2020   CL 99 06/27/2020   CO2 25 06/27/2020   Lab Results  Component Value Date   ALT 21 06/27/2020   AST 18 06/27/2020   ALKPHOS 119 06/27/2020   BILITOT 0.4 06/27/2020   Lab Results  Component Value Date   HGBA1C 6.2 (H) 06/27/2020   Lab Results  Component Value Date   INSULIN 19.4 06/27/2020   Lab Results  Component Value Date   TSH 3.380 06/27/2020   Lab Results  Component Value Date   CHOL 148 06/27/2020   HDL 38 (L) 06/27/2020   LDLCALC 85 06/27/2020   TRIG 141 06/27/2020   CHOLHDL 3.9 06/27/2020   Lab Results  Component Value Date   WBC 8.3 06/27/2020   HGB 13.5 06/27/2020   HCT 41.1 06/27/2020   MCV 85 06/27/2020   PLT 337 06/27/2020   Attestation Statements:   This is the patient's first visit at [facility]. The patient's NEW PATIENT PACKET was reviewed at length. Included in the packet: current and past health history, medications, allergies, ROS, gynecologic history (women only), surgical history, family history, social history, weight history, weight loss surgery history (for those that have had weight loss surgery), nutritional evaluation, mood and food questionnaire, PHQ9, Epworth questionnaire, sleep habits questionnaire, patient life and health improvement goals questionnaire. These will all be scanned into the patient's chart under media.   During the visit, I independently reviewed the patient's EKG, bioimpedance scale results, and indirect calorimeter results. I used this information to tailor a meal plan for the patient that will help her to lose weight and will improve her obesity-related conditions going forward. I performed a medically necessary appropriate examination and/or evaluation. I discussed the assessment and treatment plan with the patient. The patient was provided an opportunity to ask questions and all were answered. The patient agreed with the plan and  demonstrated an understanding of the instructions. Labs were ordered at this visit and will be reviewed at the next visit unless more critical results need to be addressed immediately. Clinical information was updated and documented in the EMR.   I, Water quality scientist, CMA, am acting as Location manager for [employer], DO.  I have reviewed the above documentation for accuracy and completeness, and I agree with the above. Marjory Sneddon, D.O.  The [place] was signed into law in 2016 which includes the  topic of electronic health records.  This provides immediate access to information in MyChart.  This includes consultation notes, operative notes, office notes, lab results and pathology reports.  If you have any questions about what you read please let us know at your next visit so we can discuss your concerns and take corrective action if need be.  We are right here with you.

## 2020-07-11 ENCOUNTER — Encounter (INDEPENDENT_AMBULATORY_CARE_PROVIDER_SITE_OTHER): Payer: Self-pay | Admitting: Family Medicine

## 2020-07-11 ENCOUNTER — Ambulatory Visit (INDEPENDENT_AMBULATORY_CARE_PROVIDER_SITE_OTHER): Payer: 59 | Admitting: Family Medicine

## 2020-07-11 ENCOUNTER — Other Ambulatory Visit: Payer: Self-pay

## 2020-07-11 VITALS — BP 113/76 | Temp 98.1°F | Ht 63.0 in | Wt 336.0 lb

## 2020-07-11 DIAGNOSIS — E7849 Other hyperlipidemia: Secondary | ICD-10-CM | POA: Diagnosis not present

## 2020-07-11 DIAGNOSIS — E559 Vitamin D deficiency, unspecified: Secondary | ICD-10-CM

## 2020-07-11 DIAGNOSIS — R7303 Prediabetes: Secondary | ICD-10-CM

## 2020-07-11 DIAGNOSIS — I10 Essential (primary) hypertension: Secondary | ICD-10-CM | POA: Diagnosis not present

## 2020-07-11 DIAGNOSIS — E66813 Obesity, class 3: Secondary | ICD-10-CM

## 2020-07-11 DIAGNOSIS — Z6841 Body Mass Index (BMI) 40.0 and over, adult: Secondary | ICD-10-CM

## 2020-07-13 ENCOUNTER — Telehealth (INDEPENDENT_AMBULATORY_CARE_PROVIDER_SITE_OTHER): Payer: 59 | Admitting: Psychology

## 2020-07-13 DIAGNOSIS — F3289 Other specified depressive episodes: Secondary | ICD-10-CM

## 2020-07-13 NOTE — Progress Notes (Signed)
Office: [phone]  /  Fax: [phone]    Date: July 25, 2020   Appointment Start Time: 7:59am Duration: 29 minutes Provider: Glennie Isle, Psy.D. Type of Session: Individual Therapy  Location of Patient: Home Location of Provider: Provider's Home (private office) Type of Contact: Telepsychological Visit via MyChart Video Visit  Session Content: Chelsea Soto is a 55 y.o. female presenting for a follow-up appointment to address the previously established treatment goal of increasing coping skills. Today's appointment was a telepsychological visit due to COVID-19. Chelsea Soto provided verbal consent for today's telepsychological appointment and she is aware she is responsible for securing confidentiality on her end of the session. Prior to proceeding with today's appointment, Chelsea Soto's physical location at the time of this appointment was obtained as well a phone number she could be reached at in the event of technical difficulties. Chelsea Soto and this provider participated in today's telepsychological service. Of note, Chelsea Soto provided verbal consent to proceed via telephone due to continued technical issues via MyChart Video Visit on her end.   This provider conducted a brief check-in. Chelsea Soto shared she has been sick, adding she has been eating chicken noodle soup. She was encouraged to focus on feeling better and focusing on protein intake when possible; she agreed. She noted she has e-visit with primary care today.  Reviewed triggers for emotional eating. [name] acknowledged eating in the evenings while watching the news due to "despair and boredom." She was engaged in problem solving to help cope with the aforementioned. Chelsea Soto agreed to limiting news consumption to 30 minutes a day and planning for a snack. Psychoeducation regarding mindfulness was provided. A handout was provided to Chelsea Soto with further information regarding mindfulness, including exercises. This provider also explained the  benefit of mindfulness as it relates to emotional eating. [name] was encouraged to engage in the provided exercises between now and the next appointment with this provider. Chelsea Soto agreed. During today's appointment, Chelsea Soto was led through a mindfulness exercise involving her senses. Chelsea Soto provided verbal consent during today's appointment for this provider to send a handout about mindfulness via e-mail. Chelsea Soto was receptive to today's appointment as evidenced by openness to sharing, responsiveness to feedback, and willingness to implement discussed strategies .  Mental Status Examination:  Appearance: unable to assess  Behavior: appropriate to circumstances Mood: euthymic Affect: unable to fully assess Speech: normal in rate, volume, and tone Eye Contact: unable to assess Psychomotor Activity: unable to assess  Gait: unable to assess Thought Process: linear, logical, and goal directed  Thought Content/Perception: no hallucinations, delusions, bizarre thinking or behavior reported or observed and no evidence or endorsement of suicidal and homicidal ideation, plan, and intent Orientation: time, person, place, and purpose of appointment Memory/Concentration: memory, attention, language, and fund of knowledge intact  Insight/Judgment: good  Interventions:  Conducted a brief chart review Provided empathic reflections and validation Reviewed content from the previous session Employed supportive psychotherapy interventions to facilitate reduced distress and to improve coping skills with identified stressors Engaged patient in problem solving Psychoeducation provided regarding mindfulness Engaged patient in mindfulness exercise(s)  DSM-5 Diagnosis(es): 311 (F32.8) Other Specified Depressive Disorder, Emotional Eating Behaviors  Treatment Goal & Progress: During the initial appointment with this provider, the following treatment goal was established: increase coping skills. Chelsea Soto has  demonstrated progress in her goal as evidenced by increased awareness of hunger patterns and increased awareness of triggers for emotional eating. Chelsea Soto also demonstrates willingness to engage in mindfulness exercises.  Plan: Due to [name]'s upcoming vacation, the next appointment will  be scheduled in three weeks, which will be via MyChart Video Visit. The next session will focus on working towards the established treatment goal and termination planning. Additionally, [name] agreed to call [name] back today.

## 2020-07-14 NOTE — Progress Notes (Signed)
Chief Complaint:   OBESITY Chelsea Soto is here to discuss her progress with her obesity treatment plan along with follow-up of her obesity related diagnoses. Chelsea Soto is on the Category 2 Plan and states she is following her eating plan approximately 50% of the time. Chelsea Soto states she is not currently exercising.  Today's visit was #: 2 Starting weight: 342 lbs Starting date: 06/27/2020 Today's weight: 336 lbs Today's date: 07/11/2020 Total lbs lost to date: 6 Total lbs lost since last in-office visit: 6  Interim History: Chelsea Soto voices that she was concerned about her travel last week because she wen [employer] and drank some cocktails and ate out. She feels she could have done better. Chelsea Soto is difficult because stress of the day has mounted up and sometimes her husband wants typical foods. She has no travel for the next few weeks.  Subjective:   1. Essential hypertension Jiah's BP is very well controlled today. She is on Zestoretic. Pt denies chest pain, chest pressure and headache.  2. Other hyperlipidemia  Chelsea Soto is on Crestor 20 mg. Her HDL 38 and triglycerides 141. She denies myalgias or transaminitis.  3. Pre-diabetes Ladonne's last A1c 6.2 and insulin level 19.4. She is on Victoza 1.2 mg daily, however she wasn't taking it consistently prior to last appointment.  4. Vitamin D deficiency Chelsea Soto is on Vit D 5,000 IU BID. She reports fatigue.  Assessment/Plan:   1. Essential hypertension Chelsea Soto is working on healthy weight loss and exercise to improve blood pressure control. We will watch for signs of hypotension as she continues her lifestyle modifications. Continue current meds with no change in dose. Follow up BP at next appt- may want to consider decreasing dose.  2. Other hyperlipidemia Cardiovascular risk and specific lipid/LDL goals reviewed.  We discussed several lifestyle modifications today and Chelsea Soto will continue to work on diet, exercise and weight loss  efforts. Orders and follow up as documented in patient record. Follow up labs in 3 months.  Counseling Intensive lifestyle modifications are the first line treatment for this issue. . Dietary changes: Increase soluble fiber. Decrease simple carbohydrates. . Exercise changes: Moderate to vigorous-intensity aerobic activity 150 minutes per week if tolerated. . Lipid-lowering medications: see documented in medical record.  3. Pre-diabetes Chelsea Soto will continue to work on weight loss, exercise, and decreasing simple carbohydrates to help decrease the risk of diabetes. Repeat labs in 3 months. Continue Victoza with no change in dose.  4. Vitamin D deficiency Low Vitamin D level contributes to fatigue and are associated with obesity, breast, and colon cancer. She agrees to continue to take OTC Vitamin D @5 ,000 IU BID and will follow-up for routine testing of Vitamin D, at least 2-3 times per year to avoid over-replacement.  5. Class 3 severe obesity with serious comorbidity and body mass index (BMI) of 60.0 to 69.9 in adult, unspecified obesity type (HCC) Chelsea Soto is currently in the action stage of change. As such, her goal is to continue with weight loss efforts. She has agreed to the Category 2 Plan.   Exercise goals: No exercise has been prescribed at this time.  Behavioral modification strategies: increasing lean protein intake, meal planning and cooking strategies and travel eating strategies.  Chelsea Soto has agreed to follow-up with our clinic in 2 weeks with Dr. Raliegh Scarlet. She was informed of the importance of frequent follow-up visits to maximize her success with intensive lifestyle modifications for her multiple health conditions.   Objective:   Blood pressure 113/76, temperature  98.1 F (36.7 C), height 5\' 3"  (1.6 m), weight (!) 336 lb (152.4 kg). Body mass index is 59.52 kg/m.  General: Cooperative, alert, well developed, in no acute distress. HEENT: Conjunctivae and lids  unremarkable. Cardiovascular: Regular rhythm.  Lungs: Normal work of breathing. Neurologic: No focal deficits.   Lab Results  Component Value Date   CREATININE 0.60 06/27/2020   BUN 11 06/27/2020   NA 140 06/27/2020   K 4.2 06/27/2020   CL 99 06/27/2020   CO2 25 06/27/2020   Lab Results  Component Value Date   ALT 21 06/27/2020   AST 18 06/27/2020   ALKPHOS 119 06/27/2020   BILITOT 0.4 06/27/2020   Lab Results  Component Value Date   HGBA1C 6.2 (H) 06/27/2020   Lab Results  Component Value Date   INSULIN 19.4 06/27/2020   Lab Results  Component Value Date   TSH 3.380 06/27/2020   Lab Results  Component Value Date   CHOL 148 06/27/2020   HDL 38 (L) 06/27/2020   LDLCALC 85 06/27/2020   TRIG 141 06/27/2020   CHOLHDL 3.9 06/27/2020   Lab Results  Component Value Date   WBC 8.3 06/27/2020   HGB 13.5 06/27/2020   HCT 41.1 06/27/2020   MCV 85 06/27/2020   PLT 337 06/27/2020    Attestation Statements:   Reviewed by clinician on day of visit: allergies, medications, problem list, medical history, surgical history, family history, social history, and previous encounter notes.  Coral Ceo, am acting as transcriptionist for Coralie Common, MD.   I have reviewed the above documentation for accuracy and completeness, and I agree with the above. - Jinny Blossom, MD

## 2020-07-25 ENCOUNTER — Telehealth (INDEPENDENT_AMBULATORY_CARE_PROVIDER_SITE_OTHER): Payer: 59 | Admitting: Psychology

## 2020-07-25 ENCOUNTER — Ambulatory Visit (INDEPENDENT_AMBULATORY_CARE_PROVIDER_SITE_OTHER): Payer: 59 | Admitting: Family Medicine

## 2020-07-25 DIAGNOSIS — F3289 Other specified depressive episodes: Secondary | ICD-10-CM | POA: Diagnosis not present

## 2020-08-01 NOTE — Progress Notes (Signed)
Office: [phone]  /  Fax: [phone]    Date: Aug 15, 2020   Appointment Start Time: 8:05am Duration: 25 minutes Provider: Glennie Isle, Psy.D. Type of Session: Individual Therapy  Location of Patient: Home Location of Provider: Provider's Home (private office) Type of Contact: Telepsychological Visit via MyChart Video Visit  Session Content: This provider called Chelsea Soto at 8:02am as MyChart Video Visit stated she joined; however, there were no audio and video capabilities on [name]'s end. Assistance was provided. Due to ongoing issues, [name] provided verbal consent to proceed via telephone. As such, today's appointment was initiated 5 minutes late. Chelsea Soto is a 55 y.o. female presenting for a follow-up appointment to address the previously established treatment goal of increasing coping skills. Today's appointment was a telepsychological visit due to COVID-19. [name] provided verbal consent for today's telepsychological appointment and she is aware she is responsible for securing confidentiality on her end of the session. Prior to proceeding with today's appointment, [name]'s physical location at the time of this appointment was obtained as well a phone number she could be reached at in the event of technical difficulties. [name] and this provider participated in today's telepsychological service.   This provider conducted a brief check-in. Chelsea Soto shared engaging in emotional eating behaviors, adding she feels she does not have a routine. She discussed she also is experiencing sleep issues resulting in night time eating, noting she uses a bypap. However, she stated she does not use her machine when she is unable to sleep and moves to other parts of the home. Chelsea Soto noted she is seeing her sleep doctor on Aug 21, 2020. Due to ongoing sleeping difficulties, psychoeducation regarding sleep hygiene and the impact of poor sleep on eating habits (i.e., the role of ghrelin and leptin) was  provided. Chelsea Soto provided verbal consent during today's appointment for this provider to send a handout with sleep hygiene strategies via e-mail. Overall, Chelsea Soto was receptive to today's appointment as evidenced by openness to sharing, responsiveness to feedback, and willingness to implement discussed strategies .  Mental Status Examination:  Appearance: unable to assess  Behavior: appropriate to circumstances Mood: euthymic Affect: unable to fully assess Speech: normal in rate, volume, and tone Eye Contact: unable to assess Psychomotor Activity: unable to assess  Gait: unable to assess Thought Process: linear, logical, and goal directed  Thought Content/Perception: no hallucinations, delusions, bizarre thinking or behavior reported or observed and no evidence or endorsement of suicidal and homicidal ideation, plan, and intent Orientation: time, person, place, and purpose of appointment Memory/Concentration: memory, attention, language, and fund of knowledge intact  Insight/Judgment: fair  Interventions:  Conducted a brief chart review Provided empathic reflections and validation Employed supportive psychotherapy interventions to facilitate reduced distress and to improve coping skills with identified stressors Psychoeducation provided regarding sleep hygiene Recommended/discussed option for longer-term therapeutic services  DSM-5 Diagnosis(es): F32.89 Other Specified Depressive Disorder, Emotional Eating Behaviors  Treatment Goal & Progress: During the initial appointment with this provider, the following treatment goal was established: increase coping skills. [name] has demonstrated progress in her goal as evidenced by increased awareness of hunger patterns and increased awareness of triggers for emotional eating. [name] also continues to demonstrate willingness to engage in learned skill(s).  Plan: Per [name]'s request due to upcoming trips, the next appointment will be scheduled  in one month, which will be via MyChart Video Visit. The next session will focus on working towards the established treatment goal. Additionally, the phone number for [place] was provided again,  and [name] agreed to call to schedule an initial appointment.

## 2020-08-08 ENCOUNTER — Encounter (INDEPENDENT_AMBULATORY_CARE_PROVIDER_SITE_OTHER): Payer: Self-pay | Admitting: Family Medicine

## 2020-08-08 ENCOUNTER — Ambulatory Visit (INDEPENDENT_AMBULATORY_CARE_PROVIDER_SITE_OTHER): Payer: 59 | Admitting: Family Medicine

## 2020-08-08 ENCOUNTER — Other Ambulatory Visit: Payer: Self-pay

## 2020-08-08 VITALS — BP 115/79 | HR 63 | Temp 98.1°F | Ht 63.0 in | Wt 336.0 lb

## 2020-08-08 DIAGNOSIS — R7303 Prediabetes: Secondary | ICD-10-CM

## 2020-08-08 DIAGNOSIS — R748 Abnormal levels of other serum enzymes: Secondary | ICD-10-CM | POA: Diagnosis not present

## 2020-08-08 DIAGNOSIS — B369 Superficial mycosis, unspecified: Secondary | ICD-10-CM

## 2020-08-08 DIAGNOSIS — F329 Major depressive disorder, single episode, unspecified: Secondary | ICD-10-CM

## 2020-08-08 DIAGNOSIS — Z6841 Body Mass Index (BMI) 40.0 and over, adult: Secondary | ICD-10-CM

## 2020-08-08 DIAGNOSIS — Z9189 Other specified personal risk factors, not elsewhere classified: Secondary | ICD-10-CM | POA: Diagnosis not present

## 2020-08-08 DIAGNOSIS — F32A Depression, unspecified: Secondary | ICD-10-CM

## 2020-08-08 MED ORDER — TERBINAFINE HCL 1 % EX CREA: TOPICAL_CREAM | CUTANEOUS | 1 refills | Status: DC

## 2020-08-08 MED ORDER — NYSTATIN POWD: 0 refills | Status: DC

## 2020-08-15 ENCOUNTER — Telehealth (INDEPENDENT_AMBULATORY_CARE_PROVIDER_SITE_OTHER): Payer: 59 | Admitting: Psychology

## 2020-08-15 DIAGNOSIS — F3289 Other specified depressive episodes: Secondary | ICD-10-CM | POA: Diagnosis not present

## 2020-08-15 NOTE — Progress Notes (Signed)
Chief Complaint:   OBESITY Chelsea Soto is here to discuss her progress with her obesity treatment plan along with follow-up of her obesity related diagnoses.   Today's visit was #: 3 Starting weight: 342 lbs Starting date: 06/27/2020 Today's weight: 336 lbs Today's date: 08/08/2020 Weight change since last visit: 0 Total lbs lost to date: 6 lbs Body mass index is 59.52 kg/m.  Total weight loss percentage to date: -1.75%  Interim History:  Chelsea Soto has not been into the clinic for 1 month.  She has not been following her plan at all.  She has had increased stress in her life and tells me she has not been putting herself first or making herself a priority.  She wants to eat better and be healthier.  Denies specific concerns or issues with the meal plan.  Current Meal Plan: the Category 2 Plan for 10% of the time.  Current Exercise Plan: Going to the gym for 60 minutes once per week. Current Anti-Obesity Medications: none     Side effects: None.  Assessment/Plan:   Meds ordered this encounter  Medications  . terbinafine (LAMISIL) 1 % cream    Sig: Apply to affected area twice daily until rash gone, then apply 2 more days.    Dispense:  30 g    Refill:  1  . Nystatin POWD    Sig: Apply liberally to affected area 2 times per day    Dispense:  1 each    Refill:  0    1 jar please     1. Prediabetes Not at goal. Goal is HgbA1c < 5.7.  Medication: Victoza 1.2 mg subcutaneously daily.  A1c 6.2.  Off Victoza because she fell out of the habit of taking it with life schedule.  She got too busy.  Plan:  Discussed labs with patient today.  She will continue to focus on protein-rich, low simple carbohydrate foods. We reviewed the importance of hydration, regular exercise for stress reduction, and restorative sleep.  - Restart Victoza for help with weight loss which she was on prior.  Prudent nutritional plan, weight loss, and will follow.  Lab Results  Component Value Date   HGBA1C 6.2  (H) 06/27/2020   Lab Results  Component Value Date   INSULIN 19.4 06/27/2020     2. Low serum HDL Course: Uncontrolled.  Lipid-lowering medications: Crestor 20 mg daily.   Plan:  Discussed labs with patient today.  Increase exercise.  Follow prudent nutritional plan.  Dietary changes: Increase soluble fiber, decrease simple carbohydrates, decrease saturated fat. Exercise changes: Moderate to vigorous-intensity aerobic activity 150 minutes per week or as tolerated. We will continue to monitor along with PCP/specialists as it pertains to her weight loss journey.  Lab Results  Component Value Date   CHOL 148 06/27/2020   HDL 38 (L) 06/27/2020   LDLCALC 85 06/27/2020   TRIG 141 06/27/2020   CHOLHDL 3.9 06/27/2020   Lab Results  Component Value Date   ALT 21 06/27/2020   AST 18 06/27/2020   ALKPHOS 119 06/27/2020   BILITOT 0.4 06/27/2020   The 10-year ASCVD risk score Mikey Bussing DC Jr., et al., 2013) is: 2.5%   Values used to calculate the score:     Age: 54 years     Sex: Female     Is Non-Hispanic African American: No     Diabetic: No     Tobacco smoker: No     Systolic Blood Pressure: 025 mmHg  Is BP treated: Yes     HDL Cholesterol: 38 mg/dL     Total Cholesterol: 148 mg/dL    3. Fungal dermatitis She started developing a rash under her breasts and between her legs with working out.  Red, irritated and moist areas.  She has used cream and powder in the past and requests medication again.  Plan:  Counseled her on likely fungal infection from warm and damp region.  Keep dry with Nystatin powder.  Apply twice daily to affected areas (1 jar) no refills.  Script given.  Use OTC terbinafine cream 1% to affected areas.  If needed, will prescribe stronger strength.  Keep dry and open to air as much as possible.  - Start terbinafine (LAMISIL) 1 % cream; Apply to affected area twice daily until rash gone, then apply 2 more days.  Dispense: 30 g; Refill: 1 - Start Nystatin POWD;  Apply liberally to affected area 2 times per day  Dispense: 1 each; Refill: 0    4. Depressive disorder, with emotional eating Improving, but not optimized.  Mood improving, especially with working with Dr. Mallie Mussel, which she finds helpful.  Plan:  Discussed labs with patient today.  Continue with Dr. Mallie Mussel and medication management per PCP (Xanax).  Increase walking/yoga/meditation, etc. for stress management.    5. At risk for diabetes mellitus - Stormi was given diabetes prevention education and counseling today of more than 9 minutes.  - Counseled patient on pathophysiology of disease and meaning/ implication of lab results.  - Reviewed how certain foods can either stimulate or inhibit insulin release, and subsequently affect hunger pathways  - Importance of following a healthy meal plan with limiting amounts of simple carbohydrates discussed with patient - Effects of regular aerobic exercise on blood sugar regulation reviewed and encouraged an eventual goal of 30 min 5d/week or more as a minimum.  - Briefly discussed treatment options, which always include dietary and lifestyle modification as first line.   - Handouts provided at patient's desire and/or told to go online to the American Diabetes Association website for further information.    6. Obesity, current BMI 59.6  Course: Chelsea Soto is currently in the action stage of change. As such, her goal is to continue with weight loss efforts.   Nutrition goals: She has agreed to the Category 2 Plan.   Exercise goals: As is.  Behavioral modification strategies: meal planning and cooking strategies and planning for success.  Justa has agreed to follow-up with our clinic in 2-3 weeks. She was informed of the importance of frequent follow-up visits to maximize her success with intensive lifestyle modifications for her multiple health conditions.   Objective:   Blood pressure 115/79, pulse 63, temperature 98.1 F (36.7 C), height  5\' 3"  (1.6 m), weight (!) 336 lb (152.4 kg), SpO2 97 %. Body mass index is 59.52 kg/m.  General: Cooperative, alert, well developed, in no acute distress. HEENT: Conjunctivae and lids unremarkable. Cardiovascular: Regular rhythm.  Lungs: Normal work of breathing. Neurologic: No focal deficits.   Lab Results  Component Value Date   CREATININE 0.60 06/27/2020   BUN 11 06/27/2020   NA 140 06/27/2020   K 4.2 06/27/2020   CL 99 06/27/2020   CO2 25 06/27/2020   Lab Results  Component Value Date   ALT 21 06/27/2020   AST 18 06/27/2020   ALKPHOS 119 06/27/2020   BILITOT 0.4 06/27/2020   Lab Results  Component Value Date   HGBA1C 6.2 (H) 06/27/2020  Lab Results  Component Value Date   INSULIN 19.4 06/27/2020   Lab Results  Component Value Date   TSH 3.380 06/27/2020   Lab Results  Component Value Date   CHOL 148 06/27/2020   HDL 38 (L) 06/27/2020   LDLCALC 85 06/27/2020   TRIG 141 06/27/2020   CHOLHDL 3.9 06/27/2020   Lab Results  Component Value Date   WBC 8.3 06/27/2020   HGB 13.5 06/27/2020   HCT 41.1 06/27/2020   MCV 85 06/27/2020   PLT 337 06/27/2020   Attestation Statements:   Reviewed by clinician on day of visit: allergies, medications, problem list, medical history, surgical history, family history, social history, and previous encounter notes.  I, Water quality scientist, CMA, am acting as Location manager for [employer], DO.  I have reviewed the above documentation for accuracy and completeness, and I agree with the above. Marjory Sneddon, D.O.  The [place] was signed into law in 2016 which includes the topic of electronic health records.  This provides immediate access to information in MyChart.  This includes consultation notes, operative notes, office notes, lab results and pathology reports.  If you have any questions about what you read please let us know at your next visit so we can discuss your concerns and take corrective action if  need be.  We are right here with you.

## 2020-08-29 NOTE — Progress Notes (Unsigned)
Office: [phone]  /  Fax: [phone]    Date: September 12, 2020   Appointment Start Time:  Duration: minutes Provider: Glennie Isle, Psy.D. Type of Session: Individual Therapy  Location of Patient: {gbptloc:23249} Location of Provider: Provider's home (private office) Type of Contact: Telepsychological Visit via MyChart Video Visit  Session Content: This provider called Caren Griffins at 8:02am as she did not present for the telepsychological appointment. A HIPAA compliant voicemail was left requesting a call back.   *** As such, today's appointment was initiated *** minutes late. Marly is a 56 y.o. female presenting for a follow-up appointment to address the previously established treatment goal of increasing coping skills. Today's appointment was a telepsychological visit due to COVID-19. Quana provided verbal consent for today's telepsychological appointment and she is aware she is responsible for securing confidentiality on her end of the session. Prior to proceeding with today's appointment, Manal's physical location at the time of this appointment was obtained as well a phone number she could be reached at in the event of technical difficulties. Lissa and this provider participated in today's telepsychological service.   This provider conducted a brief check-in and verbally administered the PHQ-9 and GAD-7. *** Aleece was receptive to today's appointment as evidenced by openness to sharing, responsiveness to feedback, and {gbreceptiveness:23401}.  Mental Status Examination:  Appearance: {Appearance:22431} Behavior: {Behavior:22445} Mood: {gbmood:21757} Affect: {Affect:22436} Speech: {Speech:22432} Eye Contact: {Eye Contact:22433} Psychomotor Activity: {Motor Activity:22434} Gait: {gbgait:23404} Thought Process: {thought process:22448}  Thought Content/Perception: {disturbances:22451} Orientation: {Orientation:22437} Memory/Concentration:  {gbcognition:22449} Insight/Judgment: {Insight:22446}  Structured Assessments Results: The Patient Health Questionnaire-9 (PHQ-9) is a self-report measure that assesses symptoms and severity of depression over the course of the last two weeks. Natividad obtained a score of *** suggesting {GBPHQ9SEVERITY:21752}. Dori finds the endorsed symptoms to be {gbphq9difficulty:21754}. [0= Not at all; 1= Several days; 2= More than half the days; 3= Nearly every day] Little interest or pleasure in doing things   Feeling down, depressed, or hopeless   Trouble falling or staying asleep, or sleeping too much   Feeling tired or having little energy   Poor appetite or overeating   Feeling bad about yourself --- or that you are a failure or have let yourself or your family down   Trouble concentrating on things, such as reading the newspaper or watching television   Moving or speaking so slowly that other people could have noticed? Or the opposite --- being so fidgety or restless that you have been moving around a lot more than usual   Thoughts that you would be better off dead or hurting yourself in some way   PHQ-9 Score     The Generalized Anxiety Disorder-7 (GAD-7) is a brief self-report measure that assesses symptoms of anxiety over the course of the last two weeks. Orianna obtained a score of *** suggesting {gbgad7severity:21753}. Zaiah finds the endorsed symptoms to be {gbphq9difficulty:21754}. [0= Not at all; 1= Several days; 2= Over half the days; 3= Nearly every day] Feeling nervous, anxious, on edge   Not being able to stop or control worrying   Worrying too much about different things   Trouble relaxing   Being so restless that it's hard to sit still   Becoming easily annoyed or irritable   Feeling afraid as if something awful might happen   GAD-7 Score    Interventions:  {Interventions for Progress Notes:23405}  DSM-5 Diagnosis(es): F32.89 Other Specified Depressive Disorder, Emotional  Eating Behaviors  Treatment Goal & Progress: During the initial appointment with this  provider, the following treatment goal was established: increase coping skills. Tarnisha has demonstrated progress in her goal as evidenced by {gbtxprogress:22839}. Evangelynn also {gbtxprogress2:22951}.  Plan: The next appointment will be scheduled in {gbweeks:21758}, which will be {gbtxmodality:23402}. The next session will focus on {Plan for Next Appointment:23400}.

## 2020-08-31 ENCOUNTER — Encounter (INDEPENDENT_AMBULATORY_CARE_PROVIDER_SITE_OTHER): Payer: Self-pay

## 2020-09-04 ENCOUNTER — Ambulatory Visit (INDEPENDENT_AMBULATORY_CARE_PROVIDER_SITE_OTHER): Payer: 59 | Admitting: Family Medicine

## 2020-09-11 ENCOUNTER — Ambulatory Visit (INDEPENDENT_AMBULATORY_CARE_PROVIDER_SITE_OTHER): Payer: 59 | Admitting: Family Medicine

## 2020-09-12 ENCOUNTER — Other Ambulatory Visit: Payer: Self-pay

## 2020-09-12 ENCOUNTER — Telehealth (INDEPENDENT_AMBULATORY_CARE_PROVIDER_SITE_OTHER): Payer: Self-pay | Admitting: Psychology

## 2020-09-12 ENCOUNTER — Emergency Department (HOSPITAL_COMMUNITY)
Admission: EM | Admit: 2020-09-12 | Discharge: 2020-09-12 | Disposition: A | Discharge status: Home / Self Care | Payer: 59 | Source: Home / Self Care | Attending: Emergency Medicine | Admitting: Emergency Medicine

## 2020-09-12 ENCOUNTER — Encounter (INDEPENDENT_AMBULATORY_CARE_PROVIDER_SITE_OTHER): Payer: Self-pay

## 2020-09-12 ENCOUNTER — Emergency Department (HOSPITAL_COMMUNITY): Payer: 59

## 2020-09-12 ENCOUNTER — Encounter (HOSPITAL_COMMUNITY): Payer: Self-pay

## 2020-09-12 ENCOUNTER — Telehealth (INDEPENDENT_AMBULATORY_CARE_PROVIDER_SITE_OTHER): Payer: 59 | Admitting: Psychology

## 2020-09-12 DIAGNOSIS — I10 Essential (primary) hypertension: Secondary | ICD-10-CM | POA: Insufficient documentation

## 2020-09-12 DIAGNOSIS — Z7982 Long term (current) use of aspirin: Secondary | ICD-10-CM | POA: Insufficient documentation

## 2020-09-12 DIAGNOSIS — R109 Unspecified abdominal pain: Secondary | ICD-10-CM

## 2020-09-12 DIAGNOSIS — K460 Unspecified abdominal hernia with obstruction, without gangrene: Secondary | ICD-10-CM | POA: Diagnosis not present

## 2020-09-12 DIAGNOSIS — Z79899 Other long term (current) drug therapy: Secondary | ICD-10-CM | POA: Insufficient documentation

## 2020-09-12 DIAGNOSIS — R1012 Left upper quadrant pain: Secondary | ICD-10-CM | POA: Insufficient documentation

## 2020-09-12 DIAGNOSIS — K436 Other and unspecified ventral hernia with obstruction, without gangrene: Secondary | ICD-10-CM | POA: Diagnosis not present

## 2020-09-12 DIAGNOSIS — R197 Diarrhea, unspecified: Secondary | ICD-10-CM | POA: Insufficient documentation

## 2020-09-12 LAB — HEPATIC FUNCTION PANEL
ALT: 16 U/L (ref 0–44)
AST: 17 U/L (ref 15–41)
Albumin: 3.6 g/dL (ref 3.5–5.0)
Alkaline Phosphatase: 91 U/L (ref 38–126)
Bilirubin, Direct: <0.1 mg/dL (ref 0.0–0.2)
Total Bilirubin: 0.6 mg/dL (ref 0.3–1.2)
Total Protein: 6.8 g/dL (ref 6.5–8.1)

## 2020-09-12 LAB — BASIC METABOLIC PANEL
Anion gap: 10 (ref 5–15)
BUN: 13 mg/dL (ref 6–20)
CO2: 24 mmol/L (ref 22–32)
Calcium: 9.4 mg/dL (ref 8.9–10.3)
Chloride: 102 mmol/L (ref 98–111)
Creatinine, Ser: 0.64 mg/dL (ref 0.44–1.00)
GFR, Estimated: >60 mL/min (ref >60)
Glucose, Bld: 165 mg/dL — ABNORMAL HIGH (ref 70–99)
Potassium: 3.5 mmol/L (ref 3.5–5.1)
Sodium: 136 mmol/L (ref 135–145)

## 2020-09-12 LAB — CBC
HCT: 41.5 % (ref 36.0–46.0)
Hemoglobin: 13.1 g/dL (ref 12.0–15.0)
MCH: 27.6 pg (ref 26.0–34.0)
MCHC: 31.6 g/dL (ref 30.0–36.0)
MCV: 87.4 fL (ref 80.0–100.0)
Platelets: 325 10*3/uL (ref 150–400)
RBC: 4.75 MIL/uL (ref 3.87–5.11)
RDW: 14.4 % (ref 11.5–15.5)
WBC: 7.3 10*3/uL (ref 4.0–10.5)
nRBC: 0 % (ref 0.0–0.2)

## 2020-09-12 LAB — LIPASE, BLOOD: Lipase: 20 U/L (ref 11–51)

## 2020-09-12 LAB — TROPONIN I (HIGH SENSITIVITY)
Troponin I (High Sensitivity): 5 ng/L (ref <18)
Troponin I (High Sensitivity): 4 ng/L (ref <18)

## 2020-09-12 LAB — I-STAT BETA HCG BLOOD, ED (MC, WL, AP ONLY): I-stat hCG, quantitative: <5 m[IU]/mL (ref <5)

## 2020-09-12 MED ORDER — IOHEXOL 350 MG/ML SOLN
100.0000 mL | Freq: Once | INTRAVENOUS | Status: AC | PRN
  Administered 2020-09-12: 100 mL via INTRAVENOUS

## 2020-09-12 MED ORDER — DICYCLOMINE HCL 20 MG PO TABS: 20.0000 mg | ORAL_TABLET | Freq: Two times a day (BID) | ORAL | 0 refills | Status: DC

## 2020-09-12 MED ORDER — FENTANYL CITRATE (PF) 100 MCG/2ML IJ SOLN
100.0000 ug | Freq: Once | INTRAMUSCULAR | Status: AC
  Administered 2020-09-12: 100 ug via INTRAVENOUS
  Filled 2020-09-12: qty 2

## 2020-09-12 MED ORDER — DICYCLOMINE HCL 10 MG PO CAPS
10.0000 mg | ORAL_CAPSULE | Freq: Once | ORAL | Status: AC
  Administered 2020-09-12: 10 mg via ORAL
  Filled 2020-09-12: qty 1

## 2020-09-12 MED ORDER — HYDROCODONE-ACETAMINOPHEN 5-325 MG PO TABS
1.0000 | ORAL_TABLET | Freq: Once | ORAL | Status: DC
  Filled 2020-09-12: qty 1

## 2020-09-12 MED ORDER — HYDROCODONE-ACETAMINOPHEN 5-325 MG PO TABS: 1.0000 | ORAL_TABLET | Freq: Four times a day (QID) | ORAL | 0 refills | Status: DC | PRN

## 2020-09-12 MED ORDER — ONDANSETRON 4 MG PO TBDP
4.0000 mg | ORAL_TABLET | Freq: Once | ORAL | Status: AC
  Administered 2020-09-12: 4 mg via ORAL
  Filled 2020-09-12: qty 1

## 2020-09-12 NOTE — ED Provider Notes (Signed)
Angiocath insertion Performed by: Ephraim Hamburger  Consent: Verbal consent obtained. Risks and benefits: risks, benefits and alternatives were discussed Time out: Immediately prior to procedure a "time out" was called to verify the correct patient, procedure, equipment, support staff and site/side marked as required.  Preparation: Patient was prepped and draped in the usual sterile fashion.  Vein Location: right basilic  Ultrasound Guided  Gauge: 20  Normal blood return and flush without difficulty Patient tolerance: Patient tolerated the procedure well with no immediate complications.     Sherwood Gambler, MD 09/12/20 1247

## 2020-09-12 NOTE — ED Notes (Signed)
Patient transported to CT 

## 2020-09-12 NOTE — ED Triage Notes (Signed)
Presents with acute onset pain under Left breast radiating into Left arm, neck, breathless and lose stool starting last night. Unable to sleep last night, pain subsided then came back with worsening symptoms

## 2020-09-12 NOTE — Discharge Instructions (Addendum)
Your workup in the ER was overall reassuring. The CT was negative for concerning findings. Your lab work was negative for abnormalities.  Use tylenol and/or ibuprofen as needed for mild to moderate pain.  Use norco as needed for severe or breakthrough pain. Have caution, this may make you tired or groggy.  Use bentyl as needed for abdominal pain/spasm.  Follow up with your primary care doctor for recheck of symptoms.  Return to the ER if you develop fever, persistent vomiting, severe worsening pain, or any new, worsening, or concerning symptoms.

## 2020-09-12 NOTE — Telephone Encounter (Signed)
  Office: [phone]  /  Fax: [phone]  Date of Call: September 12, 2020  Time of Call: 8:02am Provider: Glennie Isle, PsyD  CONTENT: This provider called Kelcee to check-in as she did not present for today's MyChart Video Visit appointment at 8:00am. A HIPAA compliant voicemail was left requesting a call back. Of note, this provider stayed on the MyChart Video Visit appointment for 5 minutes prior to signing off per the clinic's grace period policy.    PLAN: This provider will wait for Verble to call back. No further follow-up planned by this provider.

## 2020-09-12 NOTE — ED Notes (Signed)
Pt tolerate liquids and feels she is ready for d/c. Pt refused hydrocodone. Wasted in pixis. IV removed and bleeding controlled. Pt ambulatory to lobby with steady gait to wait for ride. Pt verbalized understanding of d/c instruction and follow up care.

## 2020-09-12 NOTE — ED Provider Notes (Signed)
[facility] EMERGENCY DEPARTMENT Provider Note   CSN: [id number] Arrival date & time: 09/12/20  0858     History Chief Complaint  Patient presents with   Chest Pain    Chelsea Soto is a 55 y.o. female presenting for evaluation of chest pain abdominal pain.  Patient states she had mild left upper abdomen discomfort last night.  After about 2 hours of sleep, she was awoken from sleep with severe worsening pain, sweatiness, nausea.  Patient reports pain is in her left upper abdomen and radiates to her chest and her left shoulder and jaw.  It has gradually worsened throughout today.  She has not taken anything for it.  She denies history of similar.  She has fevers, chills, shortness of breath, cough, pain with inspiration, vomiting, urinary symptoms.  She does report loose stools this morning, but no blood.  No leg pain.  She had a stress test with a cardiologist about a month ago, which she states was normal.  No history of cardiac problems.  She does have a history of hypertension and hyperlipidemia for which she takes medication, history of OSA on BiPAP at night.   HPI     Past Medical History:  Diagnosis Date   Anxiety    Back pain    Depression    Dyspnea    chronic (with exertion)   GERD (gastroesophageal reflux disease)    Hiatal hernia    Hip pain    Hypercholesterolemia    Hypertension    Joint pain    Knee pain    Morbid obesity with BMI of 50.0-59.9, adult (HCC)    Post-menopausal bleeding    Prediabetes    Sleep apnea    Severe, on BiPAP   Umbilical hernia    Vitamin D deficiency    Wears contact lenses     There are no problems to display for this patient.   Past Surgical History:  Procedure Laterality Date   COLONOSCOPY     DILATATION & CURETTAGE/HYSTEROSCOPY WITH MYOSURE N/A 01/21/2019   Procedure: DILATATION & CURETTAGE/HYSTEROSCOPY WITH possible  MYOSURE;  Surgeon: [name], MD;  Location: [place];  Service: Gynecology;   Laterality: N/A;   WISDOM TOOTH EXTRACTION       OB History     Gravida  2   Para  2   Term      Preterm      AB      Living         SAB      IAB      Ectopic      Multiple      Live Births              Family History  Problem Relation Age of Onset   Breast cancer Mother    Colon polyps Mother    Diabetes Mother    Hypertension Mother    Heart disease Mother    Cancer Mother    Depression Mother    Anxiety disorder Mother    Obesity Mother    CAD Father    Hypertension Father    Hyperlipidemia Father    Heart disease Father    Sleep apnea Father    Stroke Sister     Social History   Tobacco Use   Smoking status: Never   Smokeless tobacco: Never  Vaping Use   Vaping Use: Never used  Substance Use Topics   Alcohol use: Yes  Comment: rare   Drug use: Never    Home Medications Prior to Admission medications   Medication Sig Start Date End Date Taking? Authorizing Provider  dicyclomine (BENTYL) 20 MG tablet Take 1 tablet (20 mg total) by mouth 2 (two) times daily. 09/12/20  Yes Keirra Zeimet, PA-C  HYDROcodone-acetaminophen (NORCO/VICODIN) 5-325 MG tablet Take 1 tablet by mouth every 6 (six) hours as needed. 09/12/20  Yes Yojan Paskett, PA-C  ALPRAZolam Duanne Moron) 0.5 MG tablet Take by mouth. 04/27/20 04/27/21  [provider]  Ascorbic Acid (VITAMIN C PO) Take 1 capsule by mouth 2 (two) times daily.    [provider]  aspirin 81 MG EC tablet Take by mouth. 05/16/20 05/16/21  [provider]  Cholecalciferol (VITAMIN D-3) 125 MCG (5000 UT) TABS Take 5,000 Units by mouth 2 (two) times daily.    [provider]  Ferrous Sulfate (IRON PO) Take by mouth.    [provider]  Insulin Pen Needle (B-D UF III MINI PEN NEEDLES) 31G X 5 MM MISC See admin instructions. 03/03/20   [provider]  liraglutide (VICTOZA) 18 MG/3ML SOPN Inject into the skin. 1.2 daily    [provider]   lisinopril-hydrochlorothiazide (ZESTORETIC) 20-25 MG tablet Take 1 tablet by mouth daily.    [provider]  meloxicam (MOBIC) 15 MG tablet Take 15 mg by mouth daily.    [provider]  Molnupiravir 200 MG CAPS TAKE 4 CAPSULES BY MOUTH 2 TIMES DAILY FOR 5 DAYS 06/02/20 06/02/21  Valentina Shaggy, MD  Multiple Vitamins-Minerals (ZINC PO) Take by mouth.    [provider]  Nutritional Supplements (JUICE PLUS FIBRE PO) Take 6 tablets by mouth 2 (two) times a week. 2 tablets each QP:[id number] Blend    [provider]  Nystatin POWD Apply liberally to affected area 2 times per day 08/08/20   Mellody Dance, DO  omeprazole (PRILOSEC) 40 MG capsule Take 40 mg by mouth at bedtime.    [provider]  OVER THE COUNTER MEDICATION Take 1 tablet by mouth at bedtime. Melatonin MZS Sleep Disorder (Melatonin 3mg -Zinc 8.7 mg-Selenium 50 mcg)    [provider]  rosuvastatin (CRESTOR) 20 MG tablet Take 20 mg by mouth every evening.    [provider]  Selenium 200 MCG CAPS Take 200 mcg by mouth daily.    [provider]  terbinafine (LAMISIL) 1 % cream Apply to affected area twice daily until rash gone, then apply 2 more days. 08/08/20   Mellody Dance, DO    Allergies    Patient has no known allergies.  Review of Systems   Review of Systems  Cardiovascular:  Positive for chest pain.  Gastrointestinal:  Positive for abdominal pain, diarrhea and nausea.  All other systems reviewed and are negative.  Physical Exam Updated Vital Signs BP 140/70 (BP Location: Left Arm)   Pulse 67   Temp 98.2 F (36.8 C) (Oral)   Resp 20   Ht 5\' 5"  (1.651 m)   Wt (!) 158.8 kg   SpO2 97%   BMI 58.24 kg/m   Physical Exam Vitals and nursing note reviewed.  Constitutional:      General: She is not in acute distress.    Appearance: Normal appearance. She is obese.     Comments: nontoxic  HENT:     Head: Normocephalic and  atraumatic.  Eyes:     Conjunctiva/sclera: Conjunctivae normal.     Pupils: Pupils are equal, round, and reactive to  light.  Cardiovascular:     Rate and Rhythm: Normal rate and regular rhythm.     Pulses: Normal pulses.  Pulmonary:     Effort: Pulmonary effort is normal. No respiratory distress.     Breath sounds: Normal breath sounds. No wheezing.     Comments: Speaking in full sentences.  Clear lung sounds in all fields. Abdominal:     General: There is no distension.     Palpations: Abdomen is soft.     Tenderness: There is abdominal tenderness in the epigastric area and left upper quadrant.       Comments: Ttp of LUQ abd and L sided epigastric abd  Musculoskeletal:        General: Normal range of motion.     Cervical back: Normal range of motion and neck supple.  Skin:    General: Skin is warm and dry.     Capillary Refill: Capillary refill takes less than 2 seconds.  Neurological:     Mental Status: She is alert and oriented to person, place, and time.  Psychiatric:        Mood and Affect: Mood and affect normal.        Speech: Speech normal.        Behavior: Behavior normal.    ED Results / Procedures / Treatments   Labs (all labs ordered are listed, but only abnormal results are displayed) Labs Reviewed  BASIC METABOLIC PANEL - Abnormal; Notable for the following components:      Result Value   Glucose, Bld 165 (*)    All other components within normal limits  CBC  HEPATIC FUNCTION PANEL  LIPASE, BLOOD  I-STAT BETA HCG BLOOD, ED ([facility], [facility], [facility] ONLY)  TROPONIN I (HIGH SENSITIVITY)  TROPONIN I (HIGH SENSITIVITY)    EKG EKG Interpretation  Date/Time:  Tuesday September 12 2020 09:18:46 EDT Ventricular Rate:  73 PR Interval:  176 QRS Duration: 82 QT Interval:  416 QTC Calculation: 458 R Axis:   8 Text Interpretation: Normal sinus rhythm Nonspecific T wave abnormality Abnormal ECG Confirmed by Sherwood Gambler [phone]) on 09/12/2020 11:00:52 AM  Radiology DG Chest  2 View  Result Date: 09/12/2020 CLINICAL DATA:  Left chest pain radiating to the left neck and arm. Dizziness and diaphoresis. Symptoms since last night. EXAM: CHEST - 2 VIEW COMPARISON:  None. FINDINGS: The lungs are clear. Heart size is normal. No pneumothorax or pleural effusion. No acute or focal bony abnormality. IMPRESSION: No acute disease. Electronically Signed   By: Inge Rise M.D.   On: 09/12/2020 10:11   CT Angio Chest/Abd/Pel for Dissection W and/or Wo Contrast  Result Date: 09/12/2020 CLINICAL DATA:  Sudden onset of abdominal and chest pain. Aortic dissection suspected. EXAM: CT ANGIOGRAPHY CHEST, ABDOMEN AND PELVIS TECHNIQUE: Non-contrast CT of the chest was initially obtained. Multidetector CT imaging through the chest, abdomen and pelvis was performed using the standard protocol during bolus administration of intravenous contrast. Multiplanar reconstructed images and MIPs were obtained and reviewed to evaluate the vascular anatomy. CONTRAST:  149mL OMNIPAQUE IOHEXOL 350 MG/ML SOLN COMPARISON:  Chest radiographs same date.  No prior CT. FINDINGS: CTA CHEST FINDINGS Cardiovascular: Pre contrast images demonstrate mild atherosclerosis of the aorta, great vessels and coronary arteries. There are no displaced intimal calcifications within the aorta. Following contrast, there is good enhancement of the aortic lumen. No evidence of dissection, aneurysm or significant stenosis. There is good opacification of the pulmonary arteries, and no evidence of acute pulmonary embolism. The  heart size is normal. There is no pericardial effusion. Mediastinum/Nodes: There are no enlarged mediastinal, hilar or axillary lymph nodes. The thyroid gland, trachea and esophagus demonstrate no significant findings. Lungs/Pleura: No pleural effusion or pneumothorax. The lungs are clear aside from minimal subsegmental atelectasis at the right lung base. Musculoskeletal/Chest wall: There is no chest wall mass or  suspicious osseous finding. Mild degenerative changes in the spine. Review of the MIP images confirms the above findings. CTA ABDOMEN AND PELVIS FINDINGS VASCULAR Aorta: Mild atherosclerosis without evidence of dissection or aneurysm. Celiac: Conventional anatomy.  No aneurysm or significant stenosis. SMA: Widely patent.  No aneurysm or significant stenosis. Renals: Single renal arteries bilaterally without stenosis. IMA: Patent Inflow: Atherosclerosis without dissection, aneurysm or significant stenosis. Veins: Not yet opacified with contrast and suboptimally evaluated. The IVC and iliac veins are normal in caliber. Review of the MIP images confirms the above findings. NON-VASCULAR Hepatobiliary: Possible mild contour irregularity of the liver which may indicate early cirrhosis. No focal lesion or abnormal enhancement identified. No evidence of gallstones, gallbladder wall thickening or biliary dilatation. Pancreas: Unremarkable. No pancreatic ductal dilatation or surrounding inflammatory changes. Spleen: Normal in size without focal abnormality. Adrenals/Urinary Tract: 1.7 cm left adrenal nodule measures 8 HU on the precontrast images, consistent with an incidental adenoma. The right adrenal gland appears normal. Both kidneys appear normal, without evidence of urinary tract calculus or hydronephrosis. The bladder appears normal. Stomach/Bowel: No enteric contrast administered. No evidence of bowel wall thickening, significant distention or surrounding inflammation. A portion of the transverse colon extends into a sizable ventral hernia. No evidence of incarceration or obstruction. There are diverticular changes throughout the colon. The appendix appears normal. Lymphatic: No enlarged abdominopelvic lymph nodes. Reproductive: The uterus and ovaries appear normal. No adnexal mass. Other: Umbilical hernia has a broad-based approximately 6.2 cm aperture and contains omental fat and a portion of the transverse colon.  No evidence of bowel incarceration or obstruction. No ascites, focal extraluminal fluid collection or free air. Musculoskeletal: No acute osseous findings. Moderate lower lumbar spondylosis. Review of the MIP images confirms the above findings. IMPRESSION: 1. No evidence of aortic dissection, aneurysm, large vessel occlusion or other acute vascular findings in the chest, abdomen or pelvis. 2. Coronary and Aortic Atherosclerosis (ICD10-I70.0). 3. Possible early changes of hepatic cirrhosis. 4. Large ventral hernia containing a portion of the transverse colon. No evidence of bowel incarceration or obstruction. Underlying diffuse colonic diverticulosis. 5. Small left adrenal adenoma. Electronically Signed   By: Richardean Sale M.D.   On: 09/12/2020 13:45    Procedures Procedures   Medications Ordered in ED Medications  HYDROcodone-acetaminophen (NORCO/VICODIN) 5-325 MG per tablet 1 tablet (has no administration in time range)  dicyclomine (BENTYL) capsule 10 mg (has no administration in time range)  ondansetron (ZOFRAN-ODT) disintegrating tablet 4 mg (4 mg Oral Given 09/12/20 1007)  fentaNYL (SUBLIMAZE) injection 100 mcg (100 mcg Intravenous Given 09/12/20 1007)  fentaNYL (SUBLIMAZE) injection 100 mcg (100 mcg Intravenous Given 09/12/20 1323)  iohexol (OMNIPAQUE) 350 MG/ML injection 100 mL (100 mLs Intravenous Contrast Given 09/12/20 1326)    ED Course  I have reviewed the triage vital signs and the nursing notes.  Pertinent labs & imaging results that were available during my care of the patient were reviewed by me and considered in my medical decision making (see chart for details).    MDM Rules/Calculators/A&P  Patient presenting for evaluation of left-sided abdominal pain radiating to her left shoulder and jaw.  On exam, patient appears nontoxic after pain medicine given in triage.  She does continue to have tenderness palpation the abdomen.  Less likely ACS, as patient  had a negative stress test a month ago.  However in the setting of pain and risk factors, will obtain troponins x2 and EKG.  Also consider dissection as patient has abdominal pain radiating to the chest, CTA ordered.  Labs obtained from triage interpreted by me, overall reassuring.  No leukocytosis.  Kidney, liver, pancreatic function normal.  Chest x-ray viewed and independently interpreted by me, no pneumonia pneumothorax and effusion.  Repeat troponin negative.  CTA negative for acute findings.  Discussed findings with patient.  She continues to appear well.  She reports pain to the shoulder and jaw have completely resolved.  Her pain is now more periumbilical where she does have a hernia.  Exam is not consistent with incarceration, and patient does not have signs of incarceration on CT.  Discussed symptomatic management and follow-up as needed.  Patient able to tolerate p.o. without difficulty in the ED.  At this time, patient appears safe for discharge.  Return precautions given.  Patient states she understands and agrees to plan.  Final Clinical Impression(s) / ED Diagnoses Final diagnoses:  Acute abdominal pain    Rx / DC Orders ED Discharge Orders          Ordered    dicyclomine (BENTYL) 20 MG tablet  2 times daily        09/12/20 1451    HYDROcodone-acetaminophen (NORCO/VICODIN) 5-325 MG tablet  Every 6 hours PRN        09/12/20 1451             Maryanna Stuber, PA-C 09/12/20 1507    Sherwood Gambler, MD 09/12/20 1521

## 2020-09-12 NOTE — ED Provider Notes (Signed)
Emergency Medicine Provider Triage Evaluation Note  Chelsea Soto , a 55 y.o. female  was evaluated in triage.  Pt complains of left-sided chest and abdominal pain.  Pain started last night was sudden in onset and severe.  Pain starts in the left upper quadrant of the abdomen and radiates up into the chest.  She has had some associated diarrhea and nausea but no vomiting.  Pain radiates into her left arm and up to her neck.  She has never had similar pain before.  No prior abdominal surgeries.  No fevers or chills.  No cough.  Patient reports pain has been getting more severe this morning.  Review of Systems  Positive: Chest pain, abdominal pain, nausea, diarrhea Negative: Fever, cough, vomiting  Physical Exam  BP (!) 146/110 (BP Location: Left Arm)   Pulse 77   Temp 98.1 F (36.7 C) (Oral)   Resp (!) 22   SpO2 95%  Gen:   Awake, appears very uncomfortable Resp:  Tachypneic, slightly increased respiratory effort, likely due to pain MSK:   Moves extremities without difficulty  Other:  Tenderness in the left upper quadrant radiating up into the chest  Medical Decision Making  Medically screening exam initiated at 9:24 AM.  Appropriate orders placed.  MERRILLYN ACKERLEY was informed that the remainder of the evaluation will be completed by another provider, this initial triage assessment does not replace that evaluation, and the importance of remaining in the ED until their evaluation is complete.  Patient is ill-appearing with significant abdominal pain radiating into the chest as well as into the left arm and neck.  Concern for potential dissection, pain and nausea medication ordered.  Patient will need bed for mobility evaluation Dissection study.   Jacqlyn Larsen, PA-C 09/12/20 [id number]    Carmin Muskrat, MD 09/14/20 [phone]

## 2020-09-13 ENCOUNTER — Emergency Department (HOSPITAL_COMMUNITY): Payer: 59

## 2020-09-13 ENCOUNTER — Other Ambulatory Visit: Payer: Self-pay

## 2020-09-13 ENCOUNTER — Encounter (HOSPITAL_COMMUNITY): Payer: Self-pay | Admitting: Emergency Medicine

## 2020-09-13 ENCOUNTER — Inpatient Hospital Stay (HOSPITAL_COMMUNITY)
Admission: EM | Admit: 2020-09-13 | Discharge: 2020-09-15 | DRG: 336 | Disposition: A | Discharge status: Home / Self Care | Payer: 59 | Attending: General Surgery | Admitting: General Surgery

## 2020-09-13 DIAGNOSIS — E7800 Pure hypercholesterolemia, unspecified: Secondary | ICD-10-CM | POA: Diagnosis present

## 2020-09-13 DIAGNOSIS — Z20822 Contact with and (suspected) exposure to covid-19: Secondary | ICD-10-CM | POA: Diagnosis present

## 2020-09-13 DIAGNOSIS — E559 Vitamin D deficiency, unspecified: Secondary | ICD-10-CM | POA: Diagnosis present

## 2020-09-13 DIAGNOSIS — I10 Essential (primary) hypertension: Secondary | ICD-10-CM | POA: Diagnosis present

## 2020-09-13 DIAGNOSIS — E785 Hyperlipidemia, unspecified: Secondary | ICD-10-CM | POA: Diagnosis present

## 2020-09-13 DIAGNOSIS — Z833 Family history of diabetes mellitus: Secondary | ICD-10-CM

## 2020-09-13 DIAGNOSIS — K660 Peritoneal adhesions (postprocedural) (postinfection): Secondary | ICD-10-CM | POA: Diagnosis present

## 2020-09-13 DIAGNOSIS — K436 Other and unspecified ventral hernia with obstruction, without gangrene: Principal | ICD-10-CM | POA: Diagnosis present

## 2020-09-13 DIAGNOSIS — Z803 Family history of malignant neoplasm of breast: Secondary | ICD-10-CM

## 2020-09-13 DIAGNOSIS — Z6841 Body Mass Index (BMI) 40.0 and over, adult: Secondary | ICD-10-CM

## 2020-09-13 DIAGNOSIS — F32A Depression, unspecified: Secondary | ICD-10-CM | POA: Diagnosis present

## 2020-09-13 DIAGNOSIS — F419 Anxiety disorder, unspecified: Secondary | ICD-10-CM | POA: Diagnosis present

## 2020-09-13 DIAGNOSIS — G4733 Obstructive sleep apnea (adult) (pediatric): Secondary | ICD-10-CM | POA: Diagnosis present

## 2020-09-13 DIAGNOSIS — Z8249 Family history of ischemic heart disease and other diseases of the circulatory system: Secondary | ICD-10-CM

## 2020-09-13 DIAGNOSIS — Z823 Family history of stroke: Secondary | ICD-10-CM

## 2020-09-13 DIAGNOSIS — K219 Gastro-esophageal reflux disease without esophagitis: Secondary | ICD-10-CM | POA: Diagnosis present

## 2020-09-13 DIAGNOSIS — K5900 Constipation, unspecified: Secondary | ICD-10-CM | POA: Diagnosis present

## 2020-09-13 DIAGNOSIS — R7303 Prediabetes: Secondary | ICD-10-CM | POA: Diagnosis present

## 2020-09-13 DIAGNOSIS — K460 Unspecified abdominal hernia with obstruction, without gangrene: Secondary | ICD-10-CM | POA: Diagnosis present

## 2020-09-13 LAB — CBC WITH DIFFERENTIAL/PLATELET
Abs Immature Granulocytes: 0.03 10*3/uL (ref 0.00–0.07)
Basophils Absolute: 0 10*3/uL (ref 0.0–0.1)
Basophils Relative: 0 %
Eosinophils Absolute: 0.3 10*3/uL (ref 0.0–0.5)
Eosinophils Relative: 3 %
HCT: 40.8 % (ref 36.0–46.0)
Hemoglobin: 12.9 g/dL (ref 12.0–15.0)
Immature Granulocytes: 0 %
Lymphocytes Relative: 32 %
Lymphs Abs: 2.9 10*3/uL (ref 0.7–4.0)
MCH: 27.7 pg (ref 26.0–34.0)
MCHC: 31.6 g/dL (ref 30.0–36.0)
MCV: 87.7 fL (ref 80.0–100.0)
Monocytes Absolute: 0.5 10*3/uL (ref 0.1–1.0)
Monocytes Relative: 6 %
Neutro Abs: 5.2 10*3/uL (ref 1.7–7.7)
Neutrophils Relative %: 59 %
Platelets: 310 10*3/uL (ref 150–400)
RBC: 4.65 MIL/uL (ref 3.87–5.11)
RDW: 14.5 % (ref 11.5–15.5)
WBC: 9 10*3/uL (ref 4.0–10.5)
nRBC: 0 % (ref 0.0–0.2)

## 2020-09-13 LAB — COMPREHENSIVE METABOLIC PANEL
ALT: 15 U/L (ref 0–44)
AST: 17 U/L (ref 15–41)
Albumin: 3.5 g/dL (ref 3.5–5.0)
Alkaline Phosphatase: 99 U/L (ref 38–126)
Anion gap: 10 (ref 5–15)
BUN: 17 mg/dL (ref 6–20)
CO2: 27 mmol/L (ref 22–32)
Calcium: 9 mg/dL (ref 8.9–10.3)
Chloride: 101 mmol/L (ref 98–111)
Creatinine, Ser: 0.72 mg/dL (ref 0.44–1.00)
GFR, Estimated: >60 mL/min (ref >60)
Glucose, Bld: 174 mg/dL — ABNORMAL HIGH (ref 70–99)
Potassium: 3.8 mmol/L (ref 3.5–5.1)
Sodium: 138 mmol/L (ref 135–145)
Total Bilirubin: 0.6 mg/dL (ref 0.3–1.2)
Total Protein: 6.5 g/dL (ref 6.5–8.1)

## 2020-09-13 LAB — URINALYSIS, ROUTINE W REFLEX MICROSCOPIC
Bilirubin Urine: NEGATIVE
Glucose, UA: NEGATIVE mg/dL
Hgb urine dipstick: NEGATIVE
Ketones, ur: NEGATIVE mg/dL
Leukocytes,Ua: NEGATIVE
Nitrite: NEGATIVE
Protein, ur: NEGATIVE mg/dL
Specific Gravity, Urine: 1.016 (ref 1.005–1.030)
pH: 5 (ref 5.0–8.0)

## 2020-09-13 LAB — LIPASE, BLOOD: Lipase: 24 U/L (ref 11–51)

## 2020-09-13 MED ORDER — SODIUM CHLORIDE 0.9 % IV BOLUS
1000.0000 mL | Freq: Once | INTRAVENOUS | Status: AC
  Administered 2020-09-13: 1000 mL via INTRAVENOUS

## 2020-09-13 MED ORDER — HYDROMORPHONE HCL 1 MG/ML IJ SOLN
1.0000 mg | Freq: Once | INTRAMUSCULAR | Status: AC
  Administered 2020-09-13: 1 mg via INTRAVENOUS
  Filled 2020-09-13: qty 1

## 2020-09-13 NOTE — ED Notes (Signed)
Notified RN of pts O2 levels

## 2020-09-13 NOTE — ED Triage Notes (Signed)
Pt c/o abdominal pain and nausea. Seen yesterday and diagnosed with a hernia, reports symptoms were manageable until tonight when pain became severe. LBM this morning.

## 2020-09-13 NOTE — ED Provider Notes (Signed)
[facility] EMERGENCY DEPARTMENT Provider Note   CSN: [id number] Arrival date & time: 09/13/20  2112     History Chief Complaint  Patient presents with   Abdominal Pain    Chelsea Soto is a 55 y.o. female with a hx of anxiety, depression, hypertension, hypercholesterolemia, sleep apnea, and prediabetes who presents to the ED with complaints of abdominal pain that acutely worsened @ 1700 today. Patient states that she has been having abdominal pain since yesterday, seen in the ED for same, felt a bit better at discharge, however today pain acutely worsened around 1700.  Pain is constant, primarily periumbilical, radiates throughout the abdomen, worse with palpation, no alleviating factors.  She is felt nauseated since worsening pain.  Her last bowel movement was this morning, she has not been passing gas since then.  She denies fever, chills, emesis, melena, hematochezia, dysuria, vaginal bleeding, or vaginal discharge.  HPI     Past Medical History:  Diagnosis Date   Anxiety    Back pain    Depression    Dyspnea    chronic (with exertion)   GERD (gastroesophageal reflux disease)    Hiatal hernia    Hip pain    Hypercholesterolemia    Hypertension    Joint pain    Knee pain    Morbid obesity with BMI of 50.0-59.9, adult (HCC)    Post-menopausal bleeding    Prediabetes    Sleep apnea    Severe, on BiPAP   Umbilical hernia    Vitamin D deficiency    Wears contact lenses     There are no problems to display for this patient.   Past Surgical History:  Procedure Laterality Date   COLONOSCOPY     DILATATION & CURETTAGE/HYSTEROSCOPY WITH MYOSURE N/A 01/21/2019   Procedure: DILATATION & CURETTAGE/HYSTEROSCOPY WITH possible  MYOSURE;  Surgeon: [name], MD;  Location: [facility];  Service: Gynecology;  Laterality: N/A;   WISDOM TOOTH EXTRACTION       OB History     Gravida  2   Para  2   Term      Preterm      AB      Living          SAB      IAB      Ectopic      Multiple      Live Births              Family History  Problem Relation Age of Onset   Breast cancer Mother    Colon polyps Mother    Diabetes Mother    Hypertension Mother    Heart disease Mother    Cancer Mother    Depression Mother    Anxiety disorder Mother    Obesity Mother    CAD Father    Hypertension Father    Hyperlipidemia Father    Heart disease Father    Sleep apnea Father    Stroke Sister     Social History   Tobacco Use   Smoking status: Never   Smokeless tobacco: Never  Vaping Use   Vaping Use: Never used  Substance Use Topics   Alcohol use: Yes    Comment: rare   Drug use: Never    Home Medications Prior to Admission medications   Medication Sig Start Date End Date Taking? Authorizing Provider  ALPRAZolam Duanne Moron) 0.5 MG tablet Take by mouth. 04/27/20 04/27/21  [provider]  Ascorbic  Acid (VITAMIN C PO) Take 1 capsule by mouth 2 (two) times daily.    [provider]  aspirin 81 MG EC tablet Take by mouth. 05/16/20 05/16/21  [provider]  Cholecalciferol (VITAMIN D-3) 125 MCG (5000 UT) TABS Take 5,000 Units by mouth 2 (two) times daily.    [provider]  dicyclomine (BENTYL) 20 MG tablet Take 1 tablet (20 mg total) by mouth 2 (two) times daily. 09/12/20   Caccavale, Sophia, PA-C  Ferrous Sulfate (IRON PO) Take by mouth.    [provider]  HYDROcodone-acetaminophen (NORCO/VICODIN) 5-325 MG tablet Take 1 tablet by mouth every 6 (six) hours as needed. 09/12/20   Caccavale, Sophia, PA-C  Insulin Pen Needle (B-D UF III MINI PEN NEEDLES) 31G X 5 MM MISC See admin instructions. 03/03/20   [provider]  liraglutide (VICTOZA) 18 MG/3ML SOPN Inject into the skin. 1.2 daily    [provider]  lisinopril-hydrochlorothiazide (ZESTORETIC) 20-25 MG tablet Take 1 tablet by mouth daily.    [provider]  meloxicam (MOBIC) 15 MG tablet Take 15 mg  by mouth daily.    [provider]  Molnupiravir 200 MG CAPS TAKE 4 CAPSULES BY MOUTH 2 TIMES DAILY FOR 5 DAYS 06/02/20 06/02/21  Valentina Shaggy, MD  Multiple Vitamins-Minerals (ZINC PO) Take by mouth.    [provider]  Nutritional Supplements (JUICE PLUS FIBRE PO) Take 6 tablets by mouth 2 (two) times a week. 2 tablets each HQ:IONGE-[id number]-[id number] Blend    [provider]  Nystatin POWD Apply liberally to affected area 2 times per day 08/08/20   Mellody Dance, DO  omeprazole (PRILOSEC) 40 MG capsule Take 40 mg by mouth at bedtime.    [provider]  OVER THE COUNTER MEDICATION Take 1 tablet by mouth at bedtime. Melatonin MZS Sleep Disorder (Melatonin 3mg -Zinc 8.7 mg-Selenium 50 mcg)    [provider]  rosuvastatin (CRESTOR) 20 MG tablet Take 20 mg by mouth every evening.    [provider]  Selenium 200 MCG CAPS Take 200 mcg by mouth daily.    [provider]  terbinafine (LAMISIL) 1 % cream Apply to affected area twice daily until rash gone, then apply 2 more days. 08/08/20   Mellody Dance, DO    Allergies    Patient has no known allergies.  Review of Systems   Review of Systems  Constitutional:  Negative for chills and fever.  Respiratory:  Negative for shortness of breath.   Cardiovascular:  Negative for chest pain.  Gastrointestinal:  Positive for abdominal pain and nausea. Negative for blood in stool and vomiting.  Genitourinary:  Negative for dysuria, vaginal bleeding and vaginal discharge.  Neurological:  Negative for syncope.  All other systems reviewed and are negative.  Physical Exam Updated Vital Signs BP 121/60 (BP Location: Right Arm)   Pulse 92   Temp 97.8 F (36.6 C) (Oral)   Resp 19   SpO2 96%   Physical Exam Vitals and nursing note reviewed.  Constitutional:      General: She is in acute distress (mild appears uncomfortable).     Appearance: She is well-developed. She is not  toxic-appearing.  HENT:     Head: Normocephalic and atraumatic.  Eyes:     General:        Right eye: No discharge.        Left eye: No discharge.     Conjunctiva/sclera: Conjunctivae normal.  Cardiovascular:     Rate and  Rhythm: Normal rate and regular rhythm.  Pulmonary:     Effort: Pulmonary effort is normal. No respiratory distress.     Breath sounds: Normal breath sounds. No wheezing, rhonchi or rales.  Abdominal:     General: There is no distension.     Palpations: Abdomen is soft.     Tenderness: There is abdominal tenderness in the right upper quadrant, epigastric area, periumbilical area and left upper quadrant.     Hernia: A hernia (patient unable to tolerate reduction, no overlying erythema/warmth/drainage) is present.  Musculoskeletal:     Cervical back: Neck supple.  Skin:    General: Skin is warm and dry.     Findings: No rash.  Neurological:     Mental Status: She is alert.     Comments: Clear speech.   Psychiatric:        Behavior: Behavior normal.    ED Results / Procedures / Treatments   Labs (all labs ordered are listed, but only abnormal results are displayed) Labs Reviewed  URINALYSIS, ROUTINE W REFLEX MICROSCOPIC - Abnormal; Notable for the following components:      Result Value   APPearance HAZY (*)    All other components within normal limits  RESP PANEL BY RT-PCR (FLU A&B, COVID) ARPGX2  CBC WITH DIFFERENTIAL/PLATELET  COMPREHENSIVE METABOLIC PANEL  LIPASE, BLOOD    EKG None  Radiology DG Chest 2 View  Result Date: 09/12/2020 CLINICAL DATA:  Left chest pain radiating to the left neck and arm. Dizziness and diaphoresis. Symptoms since last night. EXAM: CHEST - 2 VIEW COMPARISON:  None. FINDINGS: The lungs are clear. Heart size is normal. No pneumothorax or pleural effusion. No acute or focal bony abnormality. IMPRESSION: No acute disease. Electronically Signed   By: Inge Rise M.D.   On: 09/12/2020 10:11   CT Abdomen Pelvis W  Contrast  Result Date: 09/14/2020 CLINICAL DATA:  Left flank pain migrating to umbilical pain EXAM: CT ABDOMEN AND PELVIS WITH CONTRAST TECHNIQUE: Multidetector CT imaging of the abdomen and pelvis was performed using the standard protocol following bolus administration of intravenous contrast. CONTRAST:  167mL OMNIPAQUE IOHEXOL 300 MG/ML  SOLN COMPARISON:  CT 04/14/2020 FINDINGS: Lower chest: Dependent atelectatic changes in the lung bases with more bandlike opacities favoring subsegmental atelectasis or scarring. Lung bases are otherwise clear. Normal heart size. No pericardial effusion. Three-vessel coronary artery atherosclerosis. Calcifications upon the aortic leaflets. Hepatobiliary: No worrisome focal liver lesions. Smooth liver surface contour. Normal hepatic attenuation. Normal gallbladder and biliary tree. Pancreas: No pancreatic ductal dilatation or surrounding inflammatory changes. Spleen: Normal in size. No concerning splenic lesions. Adrenals/Urinary Tract: Stable 1.8 cm nodule in the body of the left adrenal gland previously measuring 7 Hounsfield units on unenhanced imaging compatible with adrenal adenoma. No new or worrisome adrenal lesion. Stable mild bilateral perinephric stranding is nonspecific. Kidneys are normally located with symmetric enhancement and excretion. No suspicious renal lesion, urolithiasis or hydronephrosis. Urinary bladder is unremarkable for the degree of distention. Stomach/Bowel: Distal esophagus, stomach and duodenum are unremarkable. No small bowel thickening or dilatation. Normal appendix in the right lower quadrant. Portion of the transverse colon protrudes into a large multilobulated ventral hernia defect with some mild pericolonic and omental fat stranding both proximal to the hernia sac and within the herniated contents of cells. No resulting mechanical obstruction is seen. No abnormal bowel wall enhancement to suggest frank vascular compromise. Scattered colonic  diverticula without focal inflammation to suggest diverticulitis. Vascular/Lymphatic: Atherosclerotic calcifications within the abdominal aorta  and branch vessels. No aneurysm or ectasia. No enlarged abdominopelvic lymph nodes. Reproductive: Anteverted uterus. Probable nabothian cysts towards the level of the cervix. No concerning adnexal lesions. Other: Fat and large bowel containing umbilical hernia with stranding of the herniated fat contents as well as about the colon and omentum just external to the hernia sac. No other bowel containing hernia. No free abdominopelvic fluid or air. Musculoskeletal: Multilevel degenerative changes are present in the imaged portions of the spine. Features most pronounced L4-5, L5-S1. Additional degenerative changes at the SI joints and symphysis pubis. No acute osseous abnormality or suspicious osseous lesion. IMPRESSION: 1. Umbilical hernia containing portion of the transverse colon demonstrating hazy stranding about the herniated fat and colonic contents as well as just external to the hernia sac which could reflect a degree of strangulation. No significant resulting obstruction is seen. Slightly increasing hazy stranding in comparison to the CT 09/12/2020. Correlate for point tenderness and reducibility. 2. No other CT abdomen study to provide cause for patient's symptoms. Specifically, no acute urinary tract abnormality. 3. Diverticulosis without evidence of acute diverticulitis. 4.  Aortic Atherosclerosis (ICD10-I70.0). 5. Stable left adrenal nodule. Electronically Signed   By: Lovena Le M.D.   On: 09/14/2020 00:41   CT Angio Chest/Abd/Pel for Dissection W and/or Wo Contrast  Result Date: 09/12/2020 CLINICAL DATA:  Sudden onset of abdominal and chest pain. Aortic dissection suspected. EXAM: CT ANGIOGRAPHY CHEST, ABDOMEN AND PELVIS TECHNIQUE: Non-contrast CT of the chest was initially obtained. Multidetector CT imaging through the chest, abdomen and pelvis was performed  using the standard protocol during bolus administration of intravenous contrast. Multiplanar reconstructed images and MIPs were obtained and reviewed to evaluate the vascular anatomy. CONTRAST:  115mL OMNIPAQUE IOHEXOL 350 MG/ML SOLN COMPARISON:  Chest radiographs same date.  No prior CT. FINDINGS: CTA CHEST FINDINGS Cardiovascular: Pre contrast images demonstrate mild atherosclerosis of the aorta, great vessels and coronary arteries. There are no displaced intimal calcifications within the aorta. Following contrast, there is good enhancement of the aortic lumen. No evidence of dissection, aneurysm or significant stenosis. There is good opacification of the pulmonary arteries, and no evidence of acute pulmonary embolism. The heart size is normal. There is no pericardial effusion. Mediastinum/Nodes: There are no enlarged mediastinal, hilar or axillary lymph nodes. The thyroid gland, trachea and esophagus demonstrate no significant findings. Lungs/Pleura: No pleural effusion or pneumothorax. The lungs are clear aside from minimal subsegmental atelectasis at the right lung base. Musculoskeletal/Chest wall: There is no chest wall mass or suspicious osseous finding. Mild degenerative changes in the spine. Review of the MIP images confirms the above findings. CTA ABDOMEN AND PELVIS FINDINGS VASCULAR Aorta: Mild atherosclerosis without evidence of dissection or aneurysm. Celiac: Conventional anatomy.  No aneurysm or significant stenosis. SMA: Widely patent.  No aneurysm or significant stenosis. Renals: Single renal arteries bilaterally without stenosis. IMA: Patent Inflow: Atherosclerosis without dissection, aneurysm or significant stenosis. Veins: Not yet opacified with contrast and suboptimally evaluated. The IVC and iliac veins are normal in caliber. Review of the MIP images confirms the above findings. NON-VASCULAR Hepatobiliary: Possible mild contour irregularity of the liver which may indicate early cirrhosis. No  focal lesion or abnormal enhancement identified. No evidence of gallstones, gallbladder wall thickening or biliary dilatation. Pancreas: Unremarkable. No pancreatic ductal dilatation or surrounding inflammatory changes. Spleen: Normal in size without focal abnormality. Adrenals/Urinary Tract: 1.7 cm left adrenal nodule measures 8 HU on the precontrast images, consistent with an incidental adenoma. The right adrenal gland appears normal. Both  kidneys appear normal, without evidence of urinary tract calculus or hydronephrosis. The bladder appears normal. Stomach/Bowel: No enteric contrast administered. No evidence of bowel wall thickening, significant distention or surrounding inflammation. A portion of the transverse colon extends into a sizable ventral hernia. No evidence of incarceration or obstruction. There are diverticular changes throughout the colon. The appendix appears normal. Lymphatic: No enlarged abdominopelvic lymph nodes. Reproductive: The uterus and ovaries appear normal. No adnexal mass. Other: Umbilical hernia has a broad-based approximately 6.2 cm aperture and contains omental fat and a portion of the transverse colon. No evidence of bowel incarceration or obstruction. No ascites, focal extraluminal fluid collection or free air. Musculoskeletal: No acute osseous findings. Moderate lower lumbar spondylosis. Review of the MIP images confirms the above findings. IMPRESSION: 1. No evidence of aortic dissection, aneurysm, large vessel occlusion or other acute vascular findings in the chest, abdomen or pelvis. 2. Coronary and Aortic Atherosclerosis (ICD10-I70.0). 3. Possible early changes of hepatic cirrhosis. 4. Large ventral hernia containing a portion of the transverse colon. No evidence of bowel incarceration or obstruction. Underlying diffuse colonic diverticulosis. 5. Small left adrenal adenoma. Electronically Signed   By: Richardean Sale M.D.   On: 09/12/2020 13:45    Procedures Procedures    Medications Ordered in ED Medications - No data to display  ED Course  I have reviewed the triage vital signs and the nursing notes.  Pertinent labs & imaging results that were available during my care of the patient were reviewed by me and considered in my medical decision making (see chart for details).    MDM Rules/Calculators/A&P                          Patient presents to the ED with complaints of abdominal pain.    Additional history obtained:  Additional history obtained from chart review & nursing note review.  CTA dissection study:  1. No evidence of aortic dissection, aneurysm, large vessel occlusion or other acute vascular findings in the chest, abdomen or pelvis. 2. Coronary and Aortic Atherosclerosis (ICD10-I70.0). 3. Possible early changes of hepatic cirrhosis. 4. Large ventral hernia containing a portion of the transverse colon. No evidence of bowel incarceration or obstruction. Underlying diffuse colonic diverticulosis. 5. Small left adrenal adenoma.   Lab Tests:  I Ordered, reviewed, and interpreted labs, which included:  CBC/CMP/lipase/UA: Unremarkable COVID/influenza testing: Negative  Patient appears uncomfortable, her blood pressure is mildly elevated on arrival, vitals otherwise unremarkable.  Patient has a palpable hernia that I am unable to reduce on exam-exquisitely tender, patient has not received pain medication thus far.  We will plan to give IV Dilaudid and reassess.  CT scan ordered to evaluate for incarceration/strangulation.  Lactic acid ordered as well. Dr. Sherry Ruffing aware & in agreement.   Imaging Studies ordered:  I ordered imaging studies which included CT A/P, I independently reviewed, formal radiology impression shows:  1. Umbilical hernia containing portion of the transverse colon demonstrating hazy stranding about the herniated fat and colonic contents as well as just external to the hernia sac which could reflect a degree of  strangulation. No significant resulting obstruction is seen. Slightly increasing hazy stranding in comparison to the CT 09/12/2020. Correlate for point tenderness and reducibility. 2. No other CT abdomen study to provide cause for patient's symptoms. Specifically, no acute urinary tract abnormality. 3. Diverticulosis without evidence of acute diverticulitis. 4.  Aortic Atherosclerosis (ICD10-I70.0). 5. Stable left adrenal nodule  ED Course:  Following  analgesics re-attempted hernia reduction w/ attending- unsuccessful, will place ice pack on hernia and call general surgery.   01:31: CONSULT: Discussed with general surgeon Dr. Dema Severin- will come see patient in the ED.   Patient admitted with plan for OR with general surgery.   Findings and plan of care discussed with supervising physician Dr. Sherry Ruffing who has evaluated the patient & is in agreement.   Portions of this note were generated with [employer]. Dictation errors may occur despite best attempts at proofreading.  Final Clinical Impression(s) / ED Diagnoses Final diagnoses:  Incarcerated hernia    Rx / DC Orders ED Discharge Orders     None        Amaryllis Dyke, PA-C 09/14/20 0309    Tegeler, Gwenyth Allegra, MD 09/14/20 [phone]

## 2020-09-13 NOTE — ED Provider Notes (Signed)
Emergency Medicine Provider Triage Evaluation Note  Chelsea Soto , a 55 y.o. female  was evaluated in triage.  Pt complains of severe abdominal pain located around her umbilicus that became persistent since dinner. Patient evaluated in the ED yesterday where a CTA was performed which demonstrated:  4. Large ventral hernia containing a portion of the transverse colon. No evidence of bowel incarceration or obstruction. Underlying diffuse colonic diverticulosis.  At the time, no evidence of incarceration or strangulation. Patient notes she has been unable to push her hernia in since dinner. No fever or chills. Last BM this morning. Denies dysuria.   Review of Systems  Positive: Abdominal pain Negative: fever  Physical Exam  BP (!) 145/89 (BP Location: Right Arm)   Pulse 88   Temp 97.8 F (36.6 C) (Oral)   Resp 20   SpO2 95%  Gen:   Awake, no distress   Resp:  Normal effort  MSK:   Moves extremities without difficulty  Other:  Unable to reduce hernia at umbilicus. Mild overlying erythema  Medical Decision Making  Medically screening exam initiated at 9:23 PM.  Appropriate orders placed.  Chelsea Soto was informed that the remainder of the evaluation will be completed by another provider, this initial triage assessment does not replace that evaluation, and the importance of remaining in the ED until their evaluation is complete.  Concern for incarcerated hernia. Repeat abdominal labs. COVID test.    Karie Kirks 09/13/20 2135    Dorie Rank, MD 09/14/20 [phone]

## 2020-09-14 ENCOUNTER — Emergency Department (HOSPITAL_COMMUNITY): Payer: 59 | Admitting: Certified Registered Nurse Anesthetist

## 2020-09-14 ENCOUNTER — Encounter (HOSPITAL_COMMUNITY): Payer: Self-pay | Admitting: Certified Registered Nurse Anesthetist

## 2020-09-14 ENCOUNTER — Encounter (HOSPITAL_COMMUNITY): Admission: EM | Disposition: A | Discharge status: Home / Self Care | Payer: Self-pay | Source: Home / Self Care

## 2020-09-14 ENCOUNTER — Emergency Department (HOSPITAL_COMMUNITY): Payer: 59

## 2020-09-14 DIAGNOSIS — K219 Gastro-esophageal reflux disease without esophagitis: Secondary | ICD-10-CM | POA: Diagnosis present

## 2020-09-14 DIAGNOSIS — R7303 Prediabetes: Secondary | ICD-10-CM | POA: Diagnosis present

## 2020-09-14 DIAGNOSIS — K436 Other and unspecified ventral hernia with obstruction, without gangrene: Secondary | ICD-10-CM | POA: Diagnosis present

## 2020-09-14 DIAGNOSIS — E559 Vitamin D deficiency, unspecified: Secondary | ICD-10-CM | POA: Diagnosis present

## 2020-09-14 DIAGNOSIS — K5900 Constipation, unspecified: Secondary | ICD-10-CM | POA: Diagnosis present

## 2020-09-14 DIAGNOSIS — Z20822 Contact with and (suspected) exposure to covid-19: Secondary | ICD-10-CM | POA: Diagnosis present

## 2020-09-14 DIAGNOSIS — F32A Depression, unspecified: Secondary | ICD-10-CM | POA: Diagnosis present

## 2020-09-14 DIAGNOSIS — F419 Anxiety disorder, unspecified: Secondary | ICD-10-CM | POA: Diagnosis present

## 2020-09-14 DIAGNOSIS — Z803 Family history of malignant neoplasm of breast: Secondary | ICD-10-CM | POA: Diagnosis not present

## 2020-09-14 DIAGNOSIS — G4733 Obstructive sleep apnea (adult) (pediatric): Secondary | ICD-10-CM | POA: Diagnosis present

## 2020-09-14 DIAGNOSIS — Z8249 Family history of ischemic heart disease and other diseases of the circulatory system: Secondary | ICD-10-CM | POA: Diagnosis not present

## 2020-09-14 DIAGNOSIS — Z833 Family history of diabetes mellitus: Secondary | ICD-10-CM | POA: Diagnosis not present

## 2020-09-14 DIAGNOSIS — I10 Essential (primary) hypertension: Secondary | ICD-10-CM | POA: Diagnosis present

## 2020-09-14 DIAGNOSIS — K460 Unspecified abdominal hernia with obstruction, without gangrene: Secondary | ICD-10-CM | POA: Diagnosis present

## 2020-09-14 DIAGNOSIS — Z6841 Body Mass Index (BMI) 40.0 and over, adult: Secondary | ICD-10-CM | POA: Diagnosis not present

## 2020-09-14 DIAGNOSIS — E785 Hyperlipidemia, unspecified: Secondary | ICD-10-CM | POA: Diagnosis present

## 2020-09-14 DIAGNOSIS — Z823 Family history of stroke: Secondary | ICD-10-CM | POA: Diagnosis not present

## 2020-09-14 DIAGNOSIS — K660 Peritoneal adhesions (postprocedural) (postinfection): Secondary | ICD-10-CM | POA: Diagnosis present

## 2020-09-14 DIAGNOSIS — E7800 Pure hypercholesterolemia, unspecified: Secondary | ICD-10-CM | POA: Diagnosis present

## 2020-09-14 HISTORY — PX: VENTRAL HERNIA REPAIR: SHX424

## 2020-09-14 HISTORY — PX: LYSIS OF ADHESION: SHX5961

## 2020-09-14 HISTORY — PX: LAPAROSCOPY: SHX197

## 2020-09-14 LAB — CBC
HCT: 38.4 % (ref 36.0–46.0)
Hemoglobin: 12.2 g/dL (ref 12.0–15.0)
MCH: 27.7 pg (ref 26.0–34.0)
MCHC: 31.8 g/dL (ref 30.0–36.0)
MCV: 87.3 fL (ref 80.0–100.0)
Platelets: 280 10*3/uL (ref 150–400)
RBC: 4.4 MIL/uL (ref 3.87–5.11)
RDW: 14.4 % (ref 11.5–15.5)
WBC: 15 10*3/uL — ABNORMAL HIGH (ref 4.0–10.5)
nRBC: 0 % (ref 0.0–0.2)

## 2020-09-14 LAB — BASIC METABOLIC PANEL
Anion gap: 9 (ref 5–15)
BUN: 13 mg/dL (ref 6–20)
CO2: 26 mmol/L (ref 22–32)
Calcium: 8.7 mg/dL — ABNORMAL LOW (ref 8.9–10.3)
Chloride: 102 mmol/L (ref 98–111)
Creatinine, Ser: 0.67 mg/dL (ref 0.44–1.00)
GFR, Estimated: >60 mL/min (ref >60)
Glucose, Bld: 155 mg/dL — ABNORMAL HIGH (ref 70–99)
Potassium: 4 mmol/L (ref 3.5–5.1)
Sodium: 137 mmol/L (ref 135–145)

## 2020-09-14 LAB — RESP PANEL BY RT-PCR (FLU A&B, COVID) ARPGX2
Influenza A by PCR: NEGATIVE
Influenza B by PCR: NEGATIVE
SARS Coronavirus 2 by RT PCR: NEGATIVE

## 2020-09-14 LAB — HIV ANTIBODY (ROUTINE TESTING W REFLEX): HIV Screen 4th Generation wRfx: NONREACTIVE

## 2020-09-14 LAB — GLUCOSE, CAPILLARY: Glucose-Capillary: 163 mg/dL — ABNORMAL HIGH (ref 70–99)

## 2020-09-14 LAB — LACTIC ACID, PLASMA: Lactic Acid, Venous: 1.5 mmol/L (ref 0.5–1.9)

## 2020-09-14 SURGERY — LAPAROSCOPY, DIAGNOSTIC
Anesthesia: General | Site: Abdomen

## 2020-09-14 MED ORDER — LIDOCAINE HCL (PF) 1 % IJ SOLN
INTRAMUSCULAR | Status: AC
  Filled 2020-09-14: qty 30

## 2020-09-14 MED ORDER — OXYCODONE HCL 5 MG PO TABS
5.0000 mg | ORAL_TABLET | Freq: Four times a day (QID) | ORAL | Status: DC | PRN
  Administered 2020-09-14: 10 mg via ORAL
  Administered 2020-09-14: 5 mg via ORAL
  Administered 2020-09-15 (×2): 10 mg via ORAL
  Filled 2020-09-14: qty 2
  Filled 2020-09-14: qty 1
  Filled 2020-09-14 (×2): qty 2

## 2020-09-14 MED ORDER — ONDANSETRON HCL 4 MG/2ML IJ SOLN
INTRAMUSCULAR | Status: DC | PRN
  Administered 2020-09-14: 4 mg via INTRAVENOUS

## 2020-09-14 MED ORDER — MIDAZOLAM HCL 5 MG/5ML IJ SOLN
INTRAMUSCULAR | Status: DC | PRN
  Administered 2020-09-14: 2 mg via INTRAVENOUS

## 2020-09-14 MED ORDER — DEXAMETHASONE SODIUM PHOSPHATE 10 MG/ML IJ SOLN
INTRAMUSCULAR | Status: AC
  Filled 2020-09-14: qty 1

## 2020-09-14 MED ORDER — FENTANYL CITRATE (PF) 100 MCG/2ML IJ SOLN
INTRAMUSCULAR | Status: AC
  Filled 2020-09-14: qty 2

## 2020-09-14 MED ORDER — ACETAMINOPHEN 10 MG/ML IV SOLN
INTRAVENOUS | Status: AC
  Filled 2020-09-14: qty 100

## 2020-09-14 MED ORDER — SODIUM CHLORIDE (PF) 0.9 % IJ SOLN
INTRAMUSCULAR | Status: DC | PRN
  Administered 2020-09-14: 12.5 mL

## 2020-09-14 MED ORDER — LISINOPRIL 20 MG PO TABS
20.0000 mg | ORAL_TABLET | Freq: Every day | ORAL | Status: DC
  Administered 2020-09-14 – 2020-09-15 (×2): 20 mg via ORAL
  Filled 2020-09-14 (×2): qty 1

## 2020-09-14 MED ORDER — SODIUM CHLORIDE 0.9 % IV SOLN: INTRAVENOUS | Status: DC | PRN

## 2020-09-14 MED ORDER — PHENYLEPHRINE 40 MCG/ML (10ML) SYRINGE FOR IV PUSH (FOR BLOOD PRESSURE SUPPORT)
PREFILLED_SYRINGE | INTRAVENOUS | Status: AC
  Filled 2020-09-14: qty 10

## 2020-09-14 MED ORDER — CHLORHEXIDINE GLUCONATE CLOTH 2 % EX PADS
6.0000 | MEDICATED_PAD | Freq: Every day | CUTANEOUS | Status: DC
  Administered 2020-09-14 – 2020-09-15 (×2): 6 via TOPICAL

## 2020-09-14 MED ORDER — IBUPROFEN 600 MG PO TABS: 600.0000 mg | ORAL_TABLET | Freq: Four times a day (QID) | ORAL | Status: DC | PRN

## 2020-09-14 MED ORDER — 0.9 % SODIUM CHLORIDE (POUR BTL) OPTIME
TOPICAL | Status: DC | PRN
  Administered 2020-09-14: 1000 mL

## 2020-09-14 MED ORDER — PHENYLEPHRINE HCL (PRESSORS) 10 MG/ML IV SOLN
INTRAVENOUS | Status: DC | PRN
  Administered 2020-09-14 (×3): 80 ug via INTRAVENOUS
  Administered 2020-09-14: 120 ug via INTRAVENOUS
  Administered 2020-09-14: 80 ug via INTRAVENOUS

## 2020-09-14 MED ORDER — PROPOFOL 10 MG/ML IV BOLUS
INTRAVENOUS | Status: DC | PRN
  Administered 2020-09-14: 200 mg via INTRAVENOUS

## 2020-09-14 MED ORDER — FENTANYL CITRATE (PF) 100 MCG/2ML IJ SOLN
25.0000 ug | INTRAMUSCULAR | Status: DC | PRN
  Administered 2020-09-14 (×3): 50 ug via INTRAVENOUS

## 2020-09-14 MED ORDER — HYDROCHLOROTHIAZIDE 25 MG PO TABS
25.0000 mg | ORAL_TABLET | Freq: Every day | ORAL | Status: DC
  Administered 2020-09-14 – 2020-09-15 (×2): 25 mg via ORAL
  Filled 2020-09-14 (×2): qty 1

## 2020-09-14 MED ORDER — KETAMINE HCL 50 MG/5ML IJ SOSY
PREFILLED_SYRINGE | INTRAMUSCULAR | Status: AC
  Filled 2020-09-14: qty 5

## 2020-09-14 MED ORDER — HYDRALAZINE HCL 20 MG/ML IJ SOLN: 10.0000 mg | INTRAMUSCULAR | Status: DC | PRN

## 2020-09-14 MED ORDER — PROMETHAZINE HCL 25 MG/ML IJ SOLN: 6.2500 mg | INTRAMUSCULAR | Status: DC | PRN

## 2020-09-14 MED ORDER — ROCURONIUM BROMIDE 100 MG/10ML IV SOLN
INTRAVENOUS | Status: DC | PRN
  Administered 2020-09-14: 50 mg via INTRAVENOUS
  Administered 2020-09-14: 20 mg via INTRAVENOUS

## 2020-09-14 MED ORDER — DIPHENHYDRAMINE HCL 12.5 MG/5ML PO ELIX: 12.5000 mg | ORAL_SOLUTION | Freq: Four times a day (QID) | ORAL | Status: DC | PRN

## 2020-09-14 MED ORDER — DIPHENHYDRAMINE HCL 50 MG/ML IJ SOLN: 12.5000 mg | Freq: Four times a day (QID) | INTRAMUSCULAR | Status: DC | PRN

## 2020-09-14 MED ORDER — LISINOPRIL-HYDROCHLOROTHIAZIDE 20-25 MG PO TABS: 1.0000 | ORAL_TABLET | Freq: Every day | ORAL | Status: DC

## 2020-09-14 MED ORDER — SODIUM CHLORIDE 0.9 % IV SOLN
2.0000 g | INTRAVENOUS | Status: AC
  Administered 2020-09-14: 2 g via INTRAVENOUS
  Filled 2020-09-14: qty 2

## 2020-09-14 MED ORDER — PANTOPRAZOLE SODIUM 40 MG PO TBEC
40.0000 mg | DELAYED_RELEASE_TABLET | Freq: Every day | ORAL | Status: DC
  Administered 2020-09-14 – 2020-09-15 (×2): 40 mg via ORAL
  Filled 2020-09-14 (×2): qty 1

## 2020-09-14 MED ORDER — DEXAMETHASONE SODIUM PHOSPHATE 10 MG/ML IJ SOLN
INTRAMUSCULAR | Status: DC | PRN
  Administered 2020-09-14: 10 mg via INTRAVENOUS

## 2020-09-14 MED ORDER — ACETAMINOPHEN 10 MG/ML IV SOLN
1000.0000 mg | Freq: Once | INTRAVENOUS | Status: DC | PRN
  Administered 2020-09-14: 1000 mg via INTRAVENOUS

## 2020-09-14 MED ORDER — DICYCLOMINE HCL 20 MG PO TABS
20.0000 mg | ORAL_TABLET | Freq: Two times a day (BID) | ORAL | Status: DC
  Administered 2020-09-14 – 2020-09-15 (×3): 20 mg via ORAL
  Filled 2020-09-14 (×5): qty 1

## 2020-09-14 MED ORDER — ONDANSETRON HCL 4 MG/2ML IJ SOLN
4.0000 mg | Freq: Once | INTRAMUSCULAR | Status: AC
  Administered 2020-09-14: 4 mg via INTRAVENOUS
  Filled 2020-09-14: qty 2

## 2020-09-14 MED ORDER — ALBUMIN HUMAN 5 % IV SOLN: INTRAVENOUS | Status: DC | PRN

## 2020-09-14 MED ORDER — BUPIVACAINE-EPINEPHRINE 0.5% -1:200000 IJ SOLN
INTRAMUSCULAR | Status: AC
  Filled 2020-09-14: qty 1

## 2020-09-14 MED ORDER — IOHEXOL 300 MG/ML  SOLN
100.0000 mL | Freq: Once | INTRAMUSCULAR | Status: AC | PRN
  Administered 2020-09-14: 100 mL via INTRAVENOUS

## 2020-09-14 MED ORDER — HYDROMORPHONE HCL 1 MG/ML IJ SOLN
0.5000 mg | Freq: Once | INTRAMUSCULAR | Status: AC | PRN
  Administered 2020-09-14: 0.5 mg via INTRAVENOUS
  Filled 2020-09-14: qty 1

## 2020-09-14 MED ORDER — SIMETHICONE 80 MG PO CHEW
40.0000 mg | CHEWABLE_TABLET | Freq: Four times a day (QID) | ORAL | Status: DC | PRN
  Administered 2020-09-14 – 2020-09-15 (×2): 40 mg via ORAL
  Filled 2020-09-14 (×2): qty 1

## 2020-09-14 MED ORDER — SUCCINYLCHOLINE CHLORIDE 20 MG/ML IJ SOLN
INTRAMUSCULAR | Status: DC | PRN
  Administered 2020-09-14: 140 mg via INTRAVENOUS

## 2020-09-14 MED ORDER — LACTATED RINGERS IV SOLN: INTRAVENOUS | Status: DC

## 2020-09-14 MED ORDER — ONDANSETRON HCL 4 MG/2ML IJ SOLN
INTRAMUSCULAR | Status: AC
  Filled 2020-09-14: qty 2

## 2020-09-14 MED ORDER — ROSUVASTATIN CALCIUM 20 MG PO TABS
20.0000 mg | ORAL_TABLET | Freq: Every evening | ORAL | Status: DC
  Administered 2020-09-14: 20 mg via ORAL
  Filled 2020-09-14: qty 1

## 2020-09-14 MED ORDER — DOCUSATE SODIUM 100 MG PO CAPS
200.0000 mg | ORAL_CAPSULE | Freq: Two times a day (BID) | ORAL | Status: DC
  Administered 2020-09-14 – 2020-09-15 (×3): 200 mg via ORAL
  Filled 2020-09-14 (×3): qty 2

## 2020-09-14 MED ORDER — HYDROMORPHONE HCL 1 MG/ML IJ SOLN
0.5000 mg | INTRAMUSCULAR | Status: DC | PRN
Start: 2020-09-14 — End: 2020-09-15
  Filled 2020-09-14: qty 1

## 2020-09-14 MED ORDER — BUPIVACAINE-EPINEPHRINE 0.5% -1:200000 IJ SOLN
INTRAMUSCULAR | Status: DC | PRN
  Administered 2020-09-14: 12.5 mL

## 2020-09-14 MED ORDER — LACTATED RINGERS IV SOLN: INTRAVENOUS | Status: DC | PRN

## 2020-09-14 MED ORDER — FENTANYL CITRATE (PF) 250 MCG/5ML IJ SOLN
INTRAMUSCULAR | Status: AC
  Filled 2020-09-14: qty 5

## 2020-09-14 MED ORDER — SUGAMMADEX SODIUM 500 MG/5ML IV SOLN
INTRAVENOUS | Status: AC
  Filled 2020-09-14: qty 5

## 2020-09-14 MED ORDER — FENTANYL CITRATE (PF) 100 MCG/2ML IJ SOLN
INTRAMUSCULAR | Status: DC | PRN
  Administered 2020-09-14: 100 ug via INTRAVENOUS

## 2020-09-14 MED ORDER — ONDANSETRON HCL 4 MG/2ML IJ SOLN
4.0000 mg | Freq: Four times a day (QID) | INTRAMUSCULAR | Status: DC | PRN
  Administered 2020-09-14: 4 mg via INTRAVENOUS
  Filled 2020-09-14: qty 2

## 2020-09-14 MED ORDER — HEPARIN SODIUM (PORCINE) 5000 UNIT/ML IJ SOLN
5000.0000 [IU] | Freq: Three times a day (TID) | INTRAMUSCULAR | Status: DC
  Administered 2020-09-14 – 2020-09-15 (×3): 5000 [IU] via SUBCUTANEOUS
  Filled 2020-09-14 (×3): qty 1

## 2020-09-14 MED ORDER — PROMETHAZINE HCL 25 MG PO TABS
12.5000 mg | ORAL_TABLET | Freq: Four times a day (QID) | ORAL | Status: DC | PRN
  Administered 2020-09-14: 12.5 mg via ORAL
  Filled 2020-09-14: qty 1

## 2020-09-14 MED ORDER — KETAMINE HCL 10 MG/ML IJ SOLN
INTRAMUSCULAR | Status: DC | PRN
  Administered 2020-09-14: 20 mg via INTRAVENOUS
  Administered 2020-09-14: 30 mg via INTRAVENOUS

## 2020-09-14 MED ORDER — ONDANSETRON 4 MG PO TBDP: 4.0000 mg | ORAL_TABLET | Freq: Four times a day (QID) | ORAL | Status: DC | PRN

## 2020-09-14 MED ORDER — SUGAMMADEX SODIUM 200 MG/2ML IV SOLN
INTRAVENOUS | Status: DC | PRN
  Administered 2020-09-14: 600 mg via INTRAVENOUS

## 2020-09-14 MED ORDER — MIDAZOLAM HCL 2 MG/2ML IJ SOLN
INTRAMUSCULAR | Status: AC
  Filled 2020-09-14: qty 2

## 2020-09-14 MED ORDER — ACETAMINOPHEN 500 MG PO TABS
1000.0000 mg | ORAL_TABLET | Freq: Four times a day (QID) | ORAL | Status: DC
  Administered 2020-09-14 – 2020-09-15 (×4): 1000 mg via ORAL
  Filled 2020-09-14 (×6): qty 2

## 2020-09-14 SURGICAL SUPPLY — 57 items
APPLIER CLIP 5 13 M/L LIGAMAX5 (MISCELLANEOUS)
BINDER ABDOMINAL 12 ML 46-62 (SOFTGOODS) ×6 IMPLANT
BLADE CLIPPER SURG (BLADE) IMPLANT
BLADE SURG 10 STRL SS (BLADE) ×3 IMPLANT
CANISTER SUCT 3000ML PPV (MISCELLANEOUS) ×3 IMPLANT
CHLORAPREP W/TINT 26 (MISCELLANEOUS) ×3 IMPLANT
CLIP APPLIE 5 13 M/L LIGAMAX5 (MISCELLANEOUS) IMPLANT
COVER SURGICAL LIGHT HANDLE (MISCELLANEOUS) ×3 IMPLANT
COVER WAND RF STERILE (DRAPES) IMPLANT
DECANTER SPIKE VIAL GLASS SM (MISCELLANEOUS) ×3 IMPLANT
DERMABOND ADVANCED (GAUZE/BANDAGES/DRESSINGS)
DERMABOND ADVANCED .7 DNX12 (GAUZE/BANDAGES/DRESSINGS) IMPLANT
DEVICE SECURE STRAP 25 ABSORB (INSTRUMENTS) IMPLANT
DEVICE TROCAR PUNCTURE CLOSURE (ENDOMECHANICALS) IMPLANT
DRSG OPSITE POSTOP 4X6 (GAUZE/BANDAGES/DRESSINGS) ×3 IMPLANT
DRSG TEGADERM 2-3/8X2-3/4 SM (GAUZE/BANDAGES/DRESSINGS) ×6 IMPLANT
ELECT REM PT RETURN 9FT ADLT (ELECTROSURGICAL) ×3
ELECTRODE REM PT RTRN 9FT ADLT (ELECTROSURGICAL) ×2 IMPLANT
GAUZE SPONGE 2X2 8PLY STRL LF (GAUZE/BANDAGES/DRESSINGS) ×2 IMPLANT
GLOVE BIO SURGEON STRL SZ7.5 (GLOVE) ×9 IMPLANT
GLOVE INDICATOR 8.0 STRL GRN (GLOVE) ×3 IMPLANT
GOWN STRL REUS W/ TWL LRG LVL3 (GOWN DISPOSABLE) ×4 IMPLANT
GOWN STRL REUS W/ TWL XL LVL3 (GOWN DISPOSABLE) ×2 IMPLANT
GOWN STRL REUS W/TWL LRG LVL3 (GOWN DISPOSABLE) ×2
GOWN STRL REUS W/TWL XL LVL3 (GOWN DISPOSABLE) ×1
GRASPER SUT TROCAR 14GX15 (MISCELLANEOUS) IMPLANT
KIT BASIN OR (CUSTOM PROCEDURE TRAY) ×3 IMPLANT
KIT TURNOVER KIT B (KITS) ×3 IMPLANT
LIGASURE IMPACT 36 18CM CVD LR (INSTRUMENTS) ×3 IMPLANT
MARKER SKIN DUAL TIP RULER LAB (MISCELLANEOUS) IMPLANT
MAT PREVALON FULL STRYKER (MISCELLANEOUS) ×3 IMPLANT
NEEDLE INSUFFLATION 14GA 120MM (NEEDLE) ×3 IMPLANT
NEEDLE SPNL 22GX3.5 QUINCKE BK (NEEDLE) IMPLANT
NS IRRIG 1000ML POUR BTL (IV SOLUTION) ×3 IMPLANT
PAD ARMBOARD 7.5X6 YLW CONV (MISCELLANEOUS) ×6 IMPLANT
SCISSORS LAP 5X35 DISP (ENDOMECHANICALS) IMPLANT
SET IRRIG TUBING LAPAROSCOPIC (IRRIGATION / IRRIGATOR) ×3 IMPLANT
SET TUBE SMOKE EVAC HIGH FLOW (TUBING) ×3 IMPLANT
SHEARS HARMONIC ACE PLUS 36CM (ENDOMECHANICALS) IMPLANT
SLEEVE ENDOPATH XCEL 5M (ENDOMECHANICALS) IMPLANT
SPONGE GAUZE 2X2 STER 10/PKG (GAUZE/BANDAGES/DRESSINGS) ×1
SPONGE LAP 18X18 RF (DISPOSABLE) ×3 IMPLANT
STAPLER VISISTAT 35W (STAPLE) ×3 IMPLANT
SUT ETHIBOND CT1 BRD #0 30IN (SUTURE) IMPLANT
SUT NOVA NAB DX-16 0-1 5-0 T12 (SUTURE) ×6 IMPLANT
SYR BULB IRRIG 60ML STRL (SYRINGE) ×3 IMPLANT
TOWEL GREEN STERILE (TOWEL DISPOSABLE) ×3 IMPLANT
TOWEL GREEN STERILE FF (TOWEL DISPOSABLE) ×3 IMPLANT
TRAY FOLEY W/BAG SLVR 14FR (SET/KITS/TRAYS/PACK) ×3 IMPLANT
TRAY LAPAROSCOPIC MC (CUSTOM PROCEDURE TRAY) ×3 IMPLANT
TROCAR 5M 150ML BLDLS (TROCAR) ×6 IMPLANT
TROCAR XCEL BLUNT TIP 100MML (ENDOMECHANICALS) IMPLANT
TROCAR XCEL NON-BLD 11X100MML (ENDOMECHANICALS) IMPLANT
TROCAR XCEL NON-BLD 5MMX100MML (ENDOMECHANICALS) IMPLANT
TUBE CONNECTING 12X1/4 (SUCTIONS) ×3 IMPLANT
WATER STERILE IRR 1000ML POUR (IV SOLUTION) IMPLANT
YANKAUER SUCT BULB TIP NO VENT (SUCTIONS) ×3 IMPLANT

## 2020-09-14 NOTE — Transfer of Care (Signed)
Immediate Anesthesia Transfer of Care Note  Patient: Chelsea Soto  Procedure(s) Performed: DIAGNOSTIC LAPAROSCOPY (Abdomen) OPEN REPAIR OF INCARCERATED VENTRAL HERNIA  ADULT (Abdomen) LYSIS OF ADHESION (Abdomen)  Patient Location: PACU  Anesthesia Type:General  Level of Consciousness: drowsy  Airway & Oxygen Therapy: Patient Spontanous Breathing and Patient connected to face mask oxygen  Post-op Assessment: Report given to RN, Post -op Vital signs reviewed and stable and Patient moving all extremities  Post vital signs: Reviewed and stable  Last Vitals:  Vitals Value Taken Time  BP 110/58 09/14/20 0536  Temp 36.7 C 09/14/20 0536  Pulse 80 09/14/20 0541  Resp 21 09/14/20 0541  SpO2 97 % 09/14/20 0541  Vitals shown include unvalidated device data.  Last Pain:  Vitals:   09/14/20 0046  TempSrc:   PainSc: 5          Complications: No notable events documented.

## 2020-09-14 NOTE — Anesthesia Procedure Notes (Signed)
Procedure Name: Intubation Date/Time: 09/14/2020 3:30 AM Performed by: Meela Wareing T, CRNA Pre-anesthesia Checklist: Patient identified, Emergency Drugs available, Suction available and Patient being monitored Patient Re-evaluated:Patient Re-evaluated prior to induction Oxygen Delivery Method: Circle system utilized Preoxygenation: Pre-oxygenation with 100% oxygen Induction Type: IV induction and Rapid sequence Ventilation: Oral airway inserted - appropriate to patient size and Mask ventilation with difficulty Laryngoscope Size: Glidescope and 3 Grade View: Grade I Tube type: Oral Tube size: 7.5 mm Number of attempts: 1 Airway Equipment and Method: Stylet, Oral airway and Video-laryngoscopy Placement Confirmation: ETT inserted through vocal cords under direct vision, positive ETCO2 and breath sounds checked- equal and bilateral Secured at: 21 cm Tube secured with: Tape Dental Injury: Teeth and Oropharynx as per pre-operative assessment  Difficulty Due To: Difficulty was anticipated, Difficult Airway- due to anterior larynx, Difficult Airway- due to large tongue and Difficult Airway- due to limited oral opening Future Recommendations: Recommend- induction with short-acting agent, and alternative techniques readily available

## 2020-09-14 NOTE — H&P (Signed)
CC: Umbilical pain  Requesting provider: Kennith Maes PA-C  HPI: Chelsea Soto is an 55 y.o. female hx of morbid obesity, OSA, HTN, HLD, GERD presented to ED 2 days ago with chest abdominal pain.  She underwent work-up and was found to have hernia on examination.  It was not "incarcerated by CT findings."  She reports that she had been unable to reduce it for a couple of days.  She has had this for many years and has never had surgery for this.  She states she met with a surgeon in [place] and was told she was too heavy to have surgery.  At this time, she represented to the hospital with ongoing severe periumbilical pain.  She has had associated constipation.  Nothing seems to make her symptoms better or worse.  She had a repeat CT scan and further bedside attempts at reduction have been unsuccessful.  We were asked to see.  Past Medical History:  Diagnosis Date   Anxiety    Back pain    Depression    Dyspnea    chronic (with exertion)   GERD (gastroesophageal reflux disease)    Hiatal hernia    Hip pain    Hypercholesterolemia    Hypertension    Joint pain    Knee pain    Morbid obesity with BMI of 50.0-59.9, adult (HCC)    Post-menopausal bleeding    Prediabetes    Sleep apnea    Severe, on BiPAP   Umbilical hernia    Vitamin D deficiency    Wears contact lenses     Past Surgical History:  Procedure Laterality Date   COLONOSCOPY     DILATATION & CURETTAGE/HYSTEROSCOPY WITH MYOSURE N/A 01/21/2019   Procedure: DILATATION & CURETTAGE/HYSTEROSCOPY WITH possible  MYOSURE;  Surgeon: [name], MD;  Location: [facility];  Service: Gynecology;  Laterality: N/A;   WISDOM TOOTH EXTRACTION      Family History  Problem Relation Age of Onset   Breast cancer Mother    Colon polyps Mother    Diabetes Mother    Hypertension Mother    Heart disease Mother    Cancer Mother    Depression Mother    Anxiety disorder Mother    Obesity Mother    CAD Father     Hypertension Father    Hyperlipidemia Father    Heart disease Father    Sleep apnea Father    Stroke Sister     Social:  reports that she has never smoked. She has never used smokeless tobacco. She reports current alcohol use. She reports that she does not use drugs.  Allergies: No Known Allergies  Medications: I have reviewed the patient's current medications.  Results for orders placed or performed during the hospital encounter of 09/13/20 (from the past 48 hour(s))  CBC with Differential     Status: None   Collection Time: 09/13/20  9:27 PM  Result Value Ref Range   WBC 9.0 4.0 - 10.5 K/uL   RBC 4.65 3.87 - 5.11 MIL/uL   Hemoglobin 12.9 12.0 - 15.0 g/dL   HCT 40.8 36.0 - 46.0 %   MCV 87.7 80.0 - 100.0 fL   MCH 27.7 26.0 - 34.0 pg   MCHC 31.6 30.0 - 36.0 g/dL   RDW 14.5 11.5 - 15.5 %   Platelets 310 150 - 400 K/uL   nRBC 0.0 0.0 - 0.2 %   Neutrophils Relative % 59 %   Neutro Abs 5.2 1.7 - 7.7  K/uL   Lymphocytes Relative 32 %   Lymphs Abs 2.9 0.7 - 4.0 K/uL   Monocytes Relative 6 %   Monocytes Absolute 0.5 0.1 - 1.0 K/uL   Eosinophils Relative 3 %   Eosinophils Absolute 0.3 0.0 - 0.5 K/uL   Basophils Relative 0 %   Basophils Absolute 0.0 0.0 - 0.1 K/uL   Immature Granulocytes 0 %   Abs Immature Granulocytes 0.03 0.00 - 0.07 K/uL    Comment: Performed at [facility] [address] [place], [place] [address]  Comprehensive metabolic panel     Status: Abnormal   Collection Time: 09/13/20  9:27 PM  Result Value Ref Range   Sodium 138 135 - 145 mmol/L   Potassium 3.8 3.5 - 5.1 mmol/L   Chloride 101 98 - 111 mmol/L   CO2 27 22 - 32 mmol/L   Glucose, Bld 174 (H) 70 - 99 mg/dL    Comment: Glucose reference range applies only to samples taken after fasting for at least 8 hours.   BUN 17 6 - 20 mg/dL   Creatinine, Ser 0.72 0.44 - 1.00 mg/dL   Calcium 9.0 8.9 - 10.3 mg/dL   Total Protein 6.5 6.5 - 8.1 g/dL   Albumin 3.5 3.5 - 5.0 g/dL   AST 17 15 - 41 U/L   ALT  15 0 - 44 U/L   Alkaline Phosphatase 99 38 - 126 U/L   Total Bilirubin 0.6 0.3 - 1.2 mg/dL   GFR, Estimated >60 >60 mL/min    Comment: (NOTE) Calculated using the CKD-EPI Creatinine Equation (2021)    Anion gap 10 5 - 15    Comment: Performed at [facility] [address] [place], [place] [address]  Lipase, blood     Status: None   Collection Time: 09/13/20  9:27 PM  Result Value Ref Range   Lipase 24 11 - 51 U/L    Comment: Performed at [facility], [place] [address], [place], [place] [address]  Urinalysis, Routine w reflex microscopic Urine, Clean Catch     Status: Abnormal   Collection Time: 09/13/20  9:27 PM  Result Value Ref Range   Color, Urine YELLOW YELLOW   APPearance HAZY (A) CLEAR   Specific Gravity, Urine 1.016 1.005 - 1.030   pH 5.0 5.0 - 8.0   Glucose, UA NEGATIVE NEGATIVE mg/dL   Hgb urine dipstick NEGATIVE NEGATIVE   Bilirubin Urine NEGATIVE NEGATIVE   Ketones, ur NEGATIVE NEGATIVE mg/dL   Protein, ur NEGATIVE NEGATIVE mg/dL   Nitrite NEGATIVE NEGATIVE   Leukocytes,Ua NEGATIVE NEGATIVE    Comment: Performed at [facility] [address] [place], [place] [address]  Resp Panel by RT-PCR (Flu A&B, Covid) Nasopharyngeal Swab     Status: None   Collection Time: 09/13/20  9:27 PM   Specimen: Nasopharyngeal Swab; Nasopharyngeal(NP) swabs in vial transport medium  Result Value Ref Range   SARS Coronavirus 2 by RT PCR NEGATIVE NEGATIVE    Comment: (NOTE) SARS-CoV-2 target nucleic acids are NOT DETECTED.  The SARS-CoV-2 RNA is generally detectable in upper respiratory specimens during the acute phase of infection. The lowest concentration of SARS-CoV-2 viral copies this assay can detect is 138 copies/mL. A negative result does not preclude SARS-Cov-2 infection and should not be used as the sole basis for treatment or other patient management decisions. A negative result may occur with  improper specimen collection/handling, submission of specimen  other than nasopharyngeal swab, presence  of viral mutation(s) within the areas targeted by this assay, and inadequate number of viral copies(<138 copies/mL). A negative result must be combined with clinical observations, patient history, and epidemiological information. The expected result is Negative.  Fact Sheet for Patients:  EntrepreneurPulse.com.au  Fact Sheet for Healthcare Providers:  IncredibleEmployment.be  This test is no t yet approved or cleared by the [place] FDA and  has been authorized for detection and/or diagnosis of SARS-CoV-2 by FDA under an Emergency Use Authorization (EUA). This EUA will remain  in effect (meaning this test can be used) for the duration of the COVID-19 declaration under Section 564(b)(1) of the Act, 21 U.S.C.section 360bbb-3(b)(1), unless the authorization is terminated  or revoked sooner.       Influenza A by PCR NEGATIVE NEGATIVE   Influenza B by PCR NEGATIVE NEGATIVE    Comment: (NOTE) The Xpert Xpress SARS-CoV-2/FLU/RSV plus assay is intended as an aid in the diagnosis of influenza from Nasopharyngeal swab specimens and should not be used as a sole basis for treatment. Nasal washings and aspirates are unacceptable for Xpert Xpress SARS-CoV-2/FLU/RSV testing.  Fact Sheet for Patients: EntrepreneurPulse.com.au  Fact Sheet for Healthcare Providers: IncredibleEmployment.be  This test is not yet approved or cleared by the [place] FDA and has been authorized for detection and/or diagnosis of SARS-CoV-2 by FDA under an Emergency Use Authorization (EUA). This EUA will remain in effect (meaning this test can be used) for the duration of the COVID-19 declaration under Section 564(b)(1) of the Act, 21 U.S.C. section 360bbb-3(b)(1), unless the authorization is terminated or revoked.  Performed at [facility], [place] [address] [place], [place] [address]    Lactic acid, plasma     Status: None   Collection Time: 09/13/20 10:48 PM  Result Value Ref Range   Lactic Acid, Venous 1.5 0.5 - 1.9 mmol/L    Comment: Performed at [facility] [address], [place], [place] [address]    DG Chest 2 View  Result Date: 09/12/2020 CLINICAL DATA:  Left chest pain radiating to the left neck and arm. Dizziness and diaphoresis. Symptoms since last night. EXAM: CHEST - 2 VIEW COMPARISON:  None. FINDINGS: The lungs are clear. Heart size is normal. No pneumothorax or pleural effusion. No acute or focal bony abnormality. IMPRESSION: No acute disease. Electronically Signed   By: Inge Rise M.D.   On: 09/12/2020 10:11   CT Abdomen Pelvis W Contrast  Result Date: 09/14/2020 CLINICAL DATA:  Left flank pain migrating to umbilical pain EXAM: CT ABDOMEN AND PELVIS WITH CONTRAST TECHNIQUE: Multidetector CT imaging of the abdomen and pelvis was performed using the standard protocol following bolus administration of intravenous contrast. CONTRAST:  153m OMNIPAQUE IOHEXOL 300 MG/ML  SOLN COMPARISON:  CT 04/14/2020 FINDINGS: Lower chest: Dependent atelectatic changes in the lung bases with more bandlike opacities favoring subsegmental atelectasis or scarring. Lung bases are otherwise clear. Normal heart size. No pericardial effusion. Three-vessel coronary artery atherosclerosis. Calcifications upon the aortic leaflets. Hepatobiliary: No worrisome focal liver lesions. Smooth liver surface contour. Normal hepatic attenuation. Normal gallbladder and biliary tree. Pancreas: No pancreatic ductal dilatation or surrounding inflammatory changes. Spleen: Normal in size. No concerning splenic lesions. Adrenals/Urinary Tract: Stable 1.8 cm nodule in the body of the left adrenal gland previously measuring 7 Hounsfield units on unenhanced imaging compatible with adrenal adenoma. No new or worrisome adrenal lesion. Stable mild bilateral perinephric stranding is nonspecific. Kidneys are  normally located with symmetric enhancement and excretion. No suspicious  renal lesion, urolithiasis or hydronephrosis. Urinary bladder is unremarkable for the degree of distention. Stomach/Bowel: Distal esophagus, stomach and duodenum are unremarkable. No small bowel thickening or dilatation. Normal appendix in the right lower quadrant. Portion of the transverse colon protrudes into a large multilobulated ventral hernia defect with some mild pericolonic and omental fat stranding both proximal to the hernia sac and within the herniated contents of cells. No resulting mechanical obstruction is seen. No abnormal bowel wall enhancement to suggest frank vascular compromise. Scattered colonic diverticula without focal inflammation to suggest diverticulitis. Vascular/Lymphatic: Atherosclerotic calcifications within the abdominal aorta and branch vessels. No aneurysm or ectasia. No enlarged abdominopelvic lymph nodes. Reproductive: Anteverted uterus. Probable nabothian cysts towards the level of the cervix. No concerning adnexal lesions. Other: Fat and large bowel containing umbilical hernia with stranding of the herniated fat contents as well as about the colon and omentum just external to the hernia sac. No other bowel containing hernia. No free abdominopelvic fluid or air. Musculoskeletal: Multilevel degenerative changes are present in the imaged portions of the spine. Features most pronounced L4-5, L5-S1. Additional degenerative changes at the SI joints and symphysis pubis. No acute osseous abnormality or suspicious osseous lesion. IMPRESSION: 1. Umbilical hernia containing portion of the transverse colon demonstrating hazy stranding about the herniated fat and colonic contents as well as just external to the hernia sac which could reflect a degree of strangulation. No significant resulting obstruction is seen. Slightly increasing hazy stranding in comparison to the CT 09/12/2020. Correlate for point tenderness and  reducibility. 2. No other CT abdomen study to provide cause for patient's symptoms. Specifically, no acute urinary tract abnormality. 3. Diverticulosis without evidence of acute diverticulitis. 4.  Aortic Atherosclerosis (ICD10-I70.0). 5. Stable left adrenal nodule. Electronically Signed   By: Lovena Le M.D.   On: 09/14/2020 00:41   CT Angio Chest/Abd/Pel for Dissection W and/or Wo Contrast  Result Date: 09/12/2020 CLINICAL DATA:  Sudden onset of abdominal and chest pain. Aortic dissection suspected. EXAM: CT ANGIOGRAPHY CHEST, ABDOMEN AND PELVIS TECHNIQUE: Non-contrast CT of the chest was initially obtained. Multidetector CT imaging through the chest, abdomen and pelvis was performed using the standard protocol during bolus administration of intravenous contrast. Multiplanar reconstructed images and MIPs were obtained and reviewed to evaluate the vascular anatomy. CONTRAST:  114m OMNIPAQUE IOHEXOL 350 MG/ML SOLN COMPARISON:  Chest radiographs same date.  No prior CT. FINDINGS: CTA CHEST FINDINGS Cardiovascular: Pre contrast images demonstrate mild atherosclerosis of the aorta, great vessels and coronary arteries. There are no displaced intimal calcifications within the aorta. Following contrast, there is good enhancement of the aortic lumen. No evidence of dissection, aneurysm or significant stenosis. There is good opacification of the pulmonary arteries, and no evidence of acute pulmonary embolism. The heart size is normal. There is no pericardial effusion. Mediastinum/Nodes: There are no enlarged mediastinal, hilar or axillary lymph nodes. The thyroid gland, trachea and esophagus demonstrate no significant findings. Lungs/Pleura: No pleural effusion or pneumothorax. The lungs are clear aside from minimal subsegmental atelectasis at the right lung base. Musculoskeletal/Chest wall: There is no chest wall mass or suspicious osseous finding. Mild degenerative changes in the spine. Review of the MIP images  confirms the above findings. CTA ABDOMEN AND PELVIS FINDINGS VASCULAR Aorta: Mild atherosclerosis without evidence of dissection or aneurysm. Celiac: Conventional anatomy.  No aneurysm or significant stenosis. SMA: Widely patent.  No aneurysm or significant stenosis. Renals: Single renal arteries bilaterally without stenosis. IMA: Patent Inflow: Atherosclerosis without dissection, aneurysm or significant stenosis.  Veins: Not yet opacified with contrast and suboptimally evaluated. The IVC and iliac veins are normal in caliber. Review of the MIP images confirms the above findings. NON-VASCULAR Hepatobiliary: Possible mild contour irregularity of the liver which may indicate early cirrhosis. No focal lesion or abnormal enhancement identified. No evidence of gallstones, gallbladder wall thickening or biliary dilatation. Pancreas: Unremarkable. No pancreatic ductal dilatation or surrounding inflammatory changes. Spleen: Normal in size without focal abnormality. Adrenals/Urinary Tract: 1.7 cm left adrenal nodule measures 8 HU on the precontrast images, consistent with an incidental adenoma. The right adrenal gland appears normal. Both kidneys appear normal, without evidence of urinary tract calculus or hydronephrosis. The bladder appears normal. Stomach/Bowel: No enteric contrast administered. No evidence of bowel wall thickening, significant distention or surrounding inflammation. A portion of the transverse colon extends into a sizable ventral hernia. No evidence of incarceration or obstruction. There are diverticular changes throughout the colon. The appendix appears normal. Lymphatic: No enlarged abdominopelvic lymph nodes. Reproductive: The uterus and ovaries appear normal. No adnexal mass. Other: Umbilical hernia has a broad-based approximately 6.2 cm aperture and contains omental fat and a portion of the transverse colon. No evidence of bowel incarceration or obstruction. No ascites, focal extraluminal fluid  collection or free air. Musculoskeletal: No acute osseous findings. Moderate lower lumbar spondylosis. Review of the MIP images confirms the above findings. IMPRESSION: 1. No evidence of aortic dissection, aneurysm, large vessel occlusion or other acute vascular findings in the chest, abdomen or pelvis. 2. Coronary and Aortic Atherosclerosis (ICD10-I70.0). 3. Possible early changes of hepatic cirrhosis. 4. Large ventral hernia containing a portion of the transverse colon. No evidence of bowel incarceration or obstruction. Underlying diffuse colonic diverticulosis. 5. Small left adrenal adenoma. Electronically Signed   By: Richardean Sale M.D.   On: 09/12/2020 13:45    ROS - all of the below systems have been reviewed with the patient and positives are indicated with bold text General: chills, fever or night sweats Eyes: blurry vision or double vision ENT: epistaxis or sore throat Allergy/Immunology: itchy/watery eyes or nasal congestion Hematologic/Lymphatic: bleeding problems, blood clots or swollen lymph nodes Endocrine: temperature intolerance or unexpected weight changes Breast: new or changing breast lumps or nipple discharge Resp: cough, shortness of breath, or wheezing CV: chest pain or dyspnea on exertion GI: as per HPI GU: dysuria, trouble voiding, or hematuria MSK: joint pain or joint stiffness Neuro: TIA or stroke symptoms Derm: pruritus and skin lesion changes Psych: anxiety and depression  PE Blood pressure 111/74, pulse 71, temperature 97.8 F (36.6 C), temperature source Oral, resp. rate 14, SpO2 100 %. Constitutional: NAD; conversant; no deformities; wearing mask Eyes: Moist conjunctiva; no lid lag; anicteric; PERRL Neck: Trachea midline; no thyromegaly Lungs: Normal respiratory effort; no tactile fremitus CV: RRR; no palpable thrills; no pitting edema GI: Abd obese, soft, periumbilical tenderness and incarcerated bulge; no umbilical erythema; no palpable hepatosplenomegaly  but exam limited by habitus MSK: Normal range of motion of extremities; no clubbing/cyanosis Psychiatric: Appropriate affect; alert and oriented x3 Lymphatic: No palpable cervical or axillary lymphadenopathy  Results for orders placed or performed during the hospital encounter of 09/13/20 (from the past 48 hour(s))  CBC with Differential     Status: None   Collection Time: 09/13/20  9:27 PM  Result Value Ref Range   WBC 9.0 4.0 - 10.5 K/uL   RBC 4.65 3.87 - 5.11 MIL/uL   Hemoglobin 12.9 12.0 - 15.0 g/dL   HCT 40.8 36.0 - 46.0 %  MCV 87.7 80.0 - 100.0 fL   MCH 27.7 26.0 - 34.0 pg   MCHC 31.6 30.0 - 36.0 g/dL   RDW 14.5 11.5 - 15.5 %   Platelets 310 150 - 400 K/uL   nRBC 0.0 0.0 - 0.2 %   Neutrophils Relative % 59 %   Neutro Abs 5.2 1.7 - 7.7 K/uL   Lymphocytes Relative 32 %   Lymphs Abs 2.9 0.7 - 4.0 K/uL   Monocytes Relative 6 %   Monocytes Absolute 0.5 0.1 - 1.0 K/uL   Eosinophils Relative 3 %   Eosinophils Absolute 0.3 0.0 - 0.5 K/uL   Basophils Relative 0 %   Basophils Absolute 0.0 0.0 - 0.1 K/uL   Immature Granulocytes 0 %   Abs Immature Granulocytes 0.03 0.00 - 0.07 K/uL    Comment: Performed at [facility] [address] [place], [place] [address]  Comprehensive metabolic panel     Status: Abnormal   Collection Time: 09/13/20  9:27 PM  Result Value Ref Range   Sodium 138 135 - 145 mmol/L   Potassium 3.8 3.5 - 5.1 mmol/L   Chloride 101 98 - 111 mmol/L   CO2 27 22 - 32 mmol/L   Glucose, Bld 174 (H) 70 - 99 mg/dL    Comment: Glucose reference range applies only to samples taken after fasting for at least 8 hours.   BUN 17 6 - 20 mg/dL   Creatinine, Ser 0.72 0.44 - 1.00 mg/dL   Calcium 9.0 8.9 - 10.3 mg/dL   Total Protein 6.5 6.5 - 8.1 g/dL   Albumin 3.5 3.5 - 5.0 g/dL   AST 17 15 - 41 U/L   ALT 15 0 - 44 U/L   Alkaline Phosphatase 99 38 - 126 U/L   Total Bilirubin 0.6 0.3 - 1.2 mg/dL   GFR, Estimated >60 >60 mL/min    Comment: (NOTE) Calculated using  the CKD-EPI Creatinine Equation (2021)    Anion gap 10 5 - 15    Comment: Performed at [facility] [address], [place], [place] [address]  Lipase, blood     Status: None   Collection Time: 09/13/20  9:27 PM  Result Value Ref Range   Lipase 24 11 - 51 U/L    Comment: Performed at [facility], [place] [address], [place], [place] [address]  Urinalysis, Routine w reflex microscopic Urine, Clean Catch     Status: Abnormal   Collection Time: 09/13/20  9:27 PM  Result Value Ref Range   Color, Urine YELLOW YELLOW   APPearance HAZY (A) CLEAR   Specific Gravity, Urine 1.016 1.005 - 1.030   pH 5.0 5.0 - 8.0   Glucose, UA NEGATIVE NEGATIVE mg/dL   Hgb urine dipstick NEGATIVE NEGATIVE   Bilirubin Urine NEGATIVE NEGATIVE   Ketones, ur NEGATIVE NEGATIVE mg/dL   Protein, ur NEGATIVE NEGATIVE mg/dL   Nitrite NEGATIVE NEGATIVE   Leukocytes,Ua NEGATIVE NEGATIVE    Comment: Performed at [facility] [address], [place], [place] [address]  Resp Panel by RT-PCR (Flu A&B, Covid) Nasopharyngeal Swab     Status: None   Collection Time: 09/13/20  9:27 PM   Specimen: Nasopharyngeal Swab; Nasopharyngeal(NP) swabs in vial transport medium  Result Value Ref Range   SARS Coronavirus 2 by RT PCR NEGATIVE NEGATIVE    Comment: (NOTE) SARS-CoV-2 target nucleic acids are NOT DETECTED.  The SARS-CoV-2 RNA is generally detectable in upper respiratory specimens during the  acute phase of infection. The lowest concentration of SARS-CoV-2 viral copies this assay can detect is 138 copies/mL. A negative result does not preclude SARS-Cov-2 infection and should not be used as the sole basis for treatment or other patient management decisions. A negative result may occur with  improper specimen collection/handling, submission of specimen other than nasopharyngeal swab, presence of viral mutation(s) within the areas targeted by this assay, and inadequate number of viral copies(<138 copies/mL). A  negative result must be combined with clinical observations, patient history, and epidemiological information. The expected result is Negative.  Fact Sheet for Patients:  EntrepreneurPulse.com.au  Fact Sheet for Healthcare Providers:  IncredibleEmployment.be  This test is no t yet approved or cleared by the [place] FDA and  has been authorized for detection and/or diagnosis of SARS-CoV-2 by FDA under an Emergency Use Authorization (EUA). This EUA will remain  in effect (meaning this test can be used) for the duration of the COVID-19 declaration under Section 564(b)(1) of the Act, 21 U.S.C.section 360bbb-3(b)(1), unless the authorization is terminated  or revoked sooner.       Influenza A by PCR NEGATIVE NEGATIVE   Influenza B by PCR NEGATIVE NEGATIVE    Comment: (NOTE) The Xpert Xpress SARS-CoV-2/FLU/RSV plus assay is intended as an aid in the diagnosis of influenza from Nasopharyngeal swab specimens and should not be used as a sole basis for treatment. Nasal washings and aspirates are unacceptable for Xpert Xpress SARS-CoV-2/FLU/RSV testing.  Fact Sheet for Patients: EntrepreneurPulse.com.au  Fact Sheet for Healthcare Providers: IncredibleEmployment.be  This test is not yet approved or cleared by the [place] FDA and has been authorized for detection and/or diagnosis of SARS-CoV-2 by FDA under an Emergency Use Authorization (EUA). This EUA will remain in effect (meaning this test can be used) for the duration of the COVID-19 declaration under Section 564(b)(1) of the Act, 21 U.S.C. section 360bbb-3(b)(1), unless the authorization is terminated or revoked.  Performed at [facility], [place] [address], [place], [place] [address]   Lactic acid, plasma     Status: None   Collection Time: 09/13/20 10:48 PM  Result Value Ref Range   Lactic Acid, Venous 1.5 0.5 - 1.9 mmol/L    Comment:  Performed at [facility] [address] [place], [place] [address]    DG Chest 2 View  Result Date: 09/12/2020 CLINICAL DATA:  Left chest pain radiating to the left neck and arm. Dizziness and diaphoresis. Symptoms since last night. EXAM: CHEST - 2 VIEW COMPARISON:  None. FINDINGS: The lungs are clear. Heart size is normal. No pneumothorax or pleural effusion. No acute or focal bony abnormality. IMPRESSION: No acute disease. Electronically Signed   By: Inge Rise M.D.   On: 09/12/2020 10:11   CT Abdomen Pelvis W Contrast  Result Date: 09/14/2020 CLINICAL DATA:  Left flank pain migrating to umbilical pain EXAM: CT ABDOMEN AND PELVIS WITH CONTRAST TECHNIQUE: Multidetector CT imaging of the abdomen and pelvis was performed using the standard protocol following bolus administration of intravenous contrast. CONTRAST:  13m OMNIPAQUE IOHEXOL 300 MG/ML  SOLN COMPARISON:  CT 04/14/2020 FINDINGS: Lower chest: Dependent atelectatic changes in the lung bases with more bandlike opacities favoring subsegmental atelectasis or scarring. Lung bases are otherwise clear. Normal heart size. No pericardial effusion. Three-vessel coronary artery atherosclerosis. Calcifications upon the aortic leaflets. Hepatobiliary: No worrisome focal liver lesions. Smooth liver surface contour. Normal hepatic attenuation. Normal gallbladder and biliary tree. Pancreas: No pancreatic ductal dilatation or surrounding  inflammatory changes. Spleen: Normal in size. No concerning splenic lesions. Adrenals/Urinary Tract: Stable 1.8 cm nodule in the body of the left adrenal gland previously measuring 7 Hounsfield units on unenhanced imaging compatible with adrenal adenoma. No new or worrisome adrenal lesion. Stable mild bilateral perinephric stranding is nonspecific. Kidneys are normally located with symmetric enhancement and excretion. No suspicious renal lesion, urolithiasis or hydronephrosis. Urinary bladder is unremarkable for the  degree of distention. Stomach/Bowel: Distal esophagus, stomach and duodenum are unremarkable. No small bowel thickening or dilatation. Normal appendix in the right lower quadrant. Portion of the transverse colon protrudes into a large multilobulated ventral hernia defect with some mild pericolonic and omental fat stranding both proximal to the hernia sac and within the herniated contents of cells. No resulting mechanical obstruction is seen. No abnormal bowel wall enhancement to suggest frank vascular compromise. Scattered colonic diverticula without focal inflammation to suggest diverticulitis. Vascular/Lymphatic: Atherosclerotic calcifications within the abdominal aorta and branch vessels. No aneurysm or ectasia. No enlarged abdominopelvic lymph nodes. Reproductive: Anteverted uterus. Probable nabothian cysts towards the level of the cervix. No concerning adnexal lesions. Other: Fat and large bowel containing umbilical hernia with stranding of the herniated fat contents as well as about the colon and omentum just external to the hernia sac. No other bowel containing hernia. No free abdominopelvic fluid or air. Musculoskeletal: Multilevel degenerative changes are present in the imaged portions of the spine. Features most pronounced L4-5, L5-S1. Additional degenerative changes at the SI joints and symphysis pubis. No acute osseous abnormality or suspicious osseous lesion. IMPRESSION: 1. Umbilical hernia containing portion of the transverse colon demonstrating hazy stranding about the herniated fat and colonic contents as well as just external to the hernia sac which could reflect a degree of strangulation. No significant resulting obstruction is seen. Slightly increasing hazy stranding in comparison to the CT 09/12/2020. Correlate for point tenderness and reducibility. 2. No other CT abdomen study to provide cause for patient's symptoms. Specifically, no acute urinary tract abnormality. 3. Diverticulosis without  evidence of acute diverticulitis. 4.  Aortic Atherosclerosis (ICD10-I70.0). 5. Stable left adrenal nodule. Electronically Signed   By: Lovena Le M.D.   On: 09/14/2020 00:41   CT Angio Chest/Abd/Pel for Dissection W and/or Wo Contrast  Result Date: 09/12/2020 CLINICAL DATA:  Sudden onset of abdominal and chest pain. Aortic dissection suspected. EXAM: CT ANGIOGRAPHY CHEST, ABDOMEN AND PELVIS TECHNIQUE: Non-contrast CT of the chest was initially obtained. Multidetector CT imaging through the chest, abdomen and pelvis was performed using the standard protocol during bolus administration of intravenous contrast. Multiplanar reconstructed images and MIPs were obtained and reviewed to evaluate the vascular anatomy. CONTRAST:  170m OMNIPAQUE IOHEXOL 350 MG/ML SOLN COMPARISON:  Chest radiographs same date.  No prior CT. FINDINGS: CTA CHEST FINDINGS Cardiovascular: Pre contrast images demonstrate mild atherosclerosis of the aorta, great vessels and coronary arteries. There are no displaced intimal calcifications within the aorta. Following contrast, there is good enhancement of the aortic lumen. No evidence of dissection, aneurysm or significant stenosis. There is good opacification of the pulmonary arteries, and no evidence of acute pulmonary embolism. The heart size is normal. There is no pericardial effusion. Mediastinum/Nodes: There are no enlarged mediastinal, hilar or axillary lymph nodes. The thyroid gland, trachea and esophagus demonstrate no significant findings. Lungs/Pleura: No pleural effusion or pneumothorax. The lungs are clear aside from minimal subsegmental atelectasis at the right lung base. Musculoskeletal/Chest wall: There is no chest wall mass or suspicious osseous finding. Mild degenerative changes in the  spine. Review of the MIP images confirms the above findings. CTA ABDOMEN AND PELVIS FINDINGS VASCULAR Aorta: Mild atherosclerosis without evidence of dissection or aneurysm. Celiac: Conventional  anatomy.  No aneurysm or significant stenosis. SMA: Widely patent.  No aneurysm or significant stenosis. Renals: Single renal arteries bilaterally without stenosis. IMA: Patent Inflow: Atherosclerosis without dissection, aneurysm or significant stenosis. Veins: Not yet opacified with contrast and suboptimally evaluated. The IVC and iliac veins are normal in caliber. Review of the MIP images confirms the above findings. NON-VASCULAR Hepatobiliary: Possible mild contour irregularity of the liver which may indicate early cirrhosis. No focal lesion or abnormal enhancement identified. No evidence of gallstones, gallbladder wall thickening or biliary dilatation. Pancreas: Unremarkable. No pancreatic ductal dilatation or surrounding inflammatory changes. Spleen: Normal in size without focal abnormality. Adrenals/Urinary Tract: 1.7 cm left adrenal nodule measures 8 HU on the precontrast images, consistent with an incidental adenoma. The right adrenal gland appears normal. Both kidneys appear normal, without evidence of urinary tract calculus or hydronephrosis. The bladder appears normal. Stomach/Bowel: No enteric contrast administered. No evidence of bowel wall thickening, significant distention or surrounding inflammation. A portion of the transverse colon extends into a sizable ventral hernia. No evidence of incarceration or obstruction. There are diverticular changes throughout the colon. The appendix appears normal. Lymphatic: No enlarged abdominopelvic lymph nodes. Reproductive: The uterus and ovaries appear normal. No adnexal mass. Other: Umbilical hernia has a broad-based approximately 6.2 cm aperture and contains omental fat and a portion of the transverse colon. No evidence of bowel incarceration or obstruction. No ascites, focal extraluminal fluid collection or free air. Musculoskeletal: No acute osseous findings. Moderate lower lumbar spondylosis. Review of the MIP images confirms the above findings. IMPRESSION:  1. No evidence of aortic dissection, aneurysm, large vessel occlusion or other acute vascular findings in the chest, abdomen or pelvis. 2. Coronary and Aortic Atherosclerosis (ICD10-I70.0). 3. Possible early changes of hepatic cirrhosis. 4. Large ventral hernia containing a portion of the transverse colon. No evidence of bowel incarceration or obstruction. Underlying diffuse colonic diverticulosis. 5. Small left adrenal adenoma. Electronically Signed   By: Richardean Sale M.D.   On: 09/12/2020 13:45     A/P: AGNIESZKA NEWHOUSE is an 55 y.o. female with morbid obesity, OSA, HTN, HLD, GERD with incarcerated ventral hernia containing transverse colon - we have spent time personally reviewing her CT scan as well  -The anatomy and physiology of the GI tract and abdominal wall was discussed with the patient and her husband. The pathophysiology of hernias was reviewed as well -Given that this contains incarcerated bowel that is unable to be reduced at bedside, we discussed proceeding emergently to the operating room to address this.  There is no clear signs of strangulation present on the CT scan such as pneumatosis or free air within the hernia sac, however, with time, this could certainly occur -We discussed proceeding with diagnostic laparoscopy, attempts at laparoscopic reduction of the hernia, and based upon intraoperative findings, potential for bowel resection.  We also discussed probable primary closure of the hernia given her significant obesity and very high recurrence rate regardless of the approach.  We discussed that with mesh placement, her recurrence rate would remain quite high but this would also further complicate any other additional attempts at repair and/or weight loss surgery which she may be considering. She is interested in a primary repair at this juncture as well as opposed to any trial of mesh placement. -The planned procedure, material risks (including, but not  limited to, pain,  bleeding, infection, scarring, need for blood transfusion, damage to surrounding structures-blood vessels/nerves/viscus/organs, need for additional procedures, recurrence particularly in her case given significant obesity, chronic pain, worsening of pre-existing medical conditions, pneumonia, heart attack, stroke, death) benefits and alternatives to surgery were discussed at length. We noted a good probability that the procedure help improve her symptoms. The patient's questions were answered to her satisfaction, she voiced understanding and elected to proceed with surgery. Additionally, we discussed typical postoperative expectations and the recovery process.  Nadeen Landau, MD [facility] Use [email or web address] to contact on call provider

## 2020-09-14 NOTE — Anesthesia Preprocedure Evaluation (Addendum)
Anesthesia Evaluation  Patient identified by MRN, date of birth, ID band Patient awake    Reviewed: Allergy & Precautions, NPO status , Patient's Chart, lab work & pertinent test results  Airway Mallampati: III  TM Distance: >3 FB Neck ROM: Full    Dental  (+) Teeth Intact   Pulmonary sleep apnea and Continuous Positive Airway Pressure Ventilation ,    Pulmonary exam normal        Cardiovascular hypertension, Pt. on medications  Rhythm:Regular Rate:Normal     Neuro/Psych Anxiety Depression negative neurological ROS     GI/Hepatic Neg liver ROS, hiatal hernia, GERD  Medicated,Incarcerated ventral hernia    Endo/Other  Morbid obesity  Renal/GU negative Renal ROS  negative genitourinary   Musculoskeletal  (+) Arthritis ,   Abdominal (+) + obese,  Abdomen: tender.    Peds  Hematology negative hematology ROS (+)   Anesthesia Other Findings   Reproductive/Obstetrics                            Anesthesia Physical Anesthesia Plan  ASA: 3 and emergent  Anesthesia Plan: General   Post-op Pain Management:    Induction: Intravenous and Rapid sequence  PONV Risk Score and Plan: 3 and Ondansetron, Dexamethasone and Treatment may vary due to age or medical condition  Airway Management Planned: Mask and Oral ETT  Additional Equipment: None  Intra-op Plan:   Post-operative Plan: Extubation in OR  Informed Consent: I have reviewed the patients History and Physical, chart, labs and discussed the procedure including the risks, benefits and alternatives for the proposed anesthesia with the patient or authorized representative who has indicated his/her understanding and acceptance.     Dental advisory given  Plan Discussed with: CRNA  Anesthesia Plan Comments: (Lab Results      Component                Value               Date                      WBC                      9.0                  09/13/2020                HGB                      12.9                09/13/2020                HCT                      40.8                09/13/2020                MCV                      87.7                09/13/2020                PLT  310                 09/13/2020           Lab Results      Component                Value               Date                      NA                       138                 09/13/2020                K                        3.8                 09/13/2020                CO2                      27                  09/13/2020                GLUCOSE                  174 (H)             09/13/2020                BUN                      17                  09/13/2020                CREATININE               0.72                09/13/2020                CALCIUM                  9.0                 09/13/2020                GFRNONAA                 >60                 09/13/2020                GFRAA                    >60                 01/18/2019          )       Anesthesia Quick Evaluation

## 2020-09-14 NOTE — Progress Notes (Signed)
RT note. Patient home cpap all set up for the night, patient placed self on cpap. Patient resting comfortable.

## 2020-09-14 NOTE — ED Notes (Signed)
Pt SPO2 decreased to 89% RA on room air, pt appears to be resting comfortably with eyes closed, pt placed on 2L O2 via [place], pt SPO2 improved to 96%. Pt respirations even and unlabored.

## 2020-09-14 NOTE — Anesthesia Postprocedure Evaluation (Signed)
Anesthesia Post Note  Patient: Chelsea Soto  Procedure(s) Performed: DIAGNOSTIC LAPAROSCOPY (Abdomen) OPEN REPAIR OF INCARCERATED VENTRAL HERNIA  ADULT (Abdomen) LYSIS OF ADHESION (Abdomen)     Patient location during evaluation: PACU Anesthesia Type: General Level of consciousness: awake and alert Pain management: pain level controlled Vital Signs Assessment: post-procedure vital signs reviewed and stable Respiratory status: spontaneous breathing, nonlabored ventilation, respiratory function stable and patient connected to nasal cannula oxygen Cardiovascular status: blood pressure returned to baseline and stable Postop Assessment: no apparent nausea or vomiting Anesthetic complications: no   No notable events documented.  Last Vitals:  Vitals:   09/14/20 0615 09/14/20 0630  BP: (!) 98/52 (!) 97/49  Pulse: 75 74  Resp: (!) 21 16  Temp:  (!) 36.4 C  SpO2: 99% 97%    Last Pain:  Vitals:   09/14/20 0600  TempSrc:   PainSc: 7                  [name] P Reighn Kaplan

## 2020-09-14 NOTE — Op Note (Signed)
09/13/2020 - 09/14/2020  5:05 AM  PATIENT:  Chelsea Soto  55 y.o. female  Patient Care Team: Tomasa Hose, NP as PCP - General (Nurse Practitioner)  PRE-OPERATIVE DIAGNOSIS:  Incarcerated ventral hernia containing colon  POST-OPERATIVE DIAGNOSIS:  Same  PROCEDURE:   Repair of incarcerated ventral hernia containing transverse colon Diagnostic laparoscopy Lysis of adhesions x 60 minutes Partial omentectomy  SURGEON:  Sharon Mt. Jacquelene Kopecky, MD  ASSISTANT: OR staff  ANESTHESIA:   local and general  COUNTS:  Sponge, needle and instrument counts were reported correct x2 at the conclusion of the operation.  EBL: 50 mL  DRAINS: None  SPECIMEN: Omentum  COMPLICATIONS: None  FINDINGS: Diagnostic laparoscopy confirmed the findings of incarcerated transverse colon.  Laparoscopic attempts at reduction were unsuccessful.  This was clearly incarcerated but not strangulated.  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  Partial omentectomy carried out.  A primary ventral hernia repair was carried out and the defect was obliterated  DISPOSITION: PACU in satisfactory condition  DESCRIPTION: The patient was identified in preop holding and taken to the OR where she was placed on the operating room table. SCDs were placed. General endotracheal anesthesia was induced without difficulty. A foley catheter was placed by nursing. She was then prepped and draped in the usual sterile fashion. A surgical timeout was performed indicating the correct patient, procedure, positioning and need for preoperative antibiotics.   An OG tube and then placed to suction by the anesthesia team.  At Palmer's point, a stab incision was created and the Veress needle introduced into the peritoneal cavity and the first attempt.  Pneumoperitoneum was then established  to maximum pressure 15 mmHg using CO2.  An optical viewing 5 mm trocar was then placed at Palmer's point.  The laparoscope was inserted and demonstrated no evidence of Veress needle site or trocar site complications.  The abdomen was surveyed and there is an evident ventral hernia containing colon.  An additional 5 mm trocar was then placed for direct visualization in the left hemiabdomen.  Laparoscopic instruments were then utilized and gentle attempts at reduction were made.  The transverse colon is clearly incarcerated in this hernia but is viable in appearance.  I am able to reduce some of the associated omentum but much of it remains up.  It appears to be stuck within the hernia sac.  We then turned our attention to an open exposure of the hernia.  An incision was created over the ventral hernia just to the right of the umbilicus.  The hernia sacs identified and incised sharply using Metzenbaum scissors.  There is some turbid/murky fluid within the hernia sac.  This was then extended the length of the wound.  She has omental adhesions throughout the hernia sac which are chronic in appearance and may be consistent with prior omental infarctions.  The transverse colon is pink and well perfused.  There is no evidence of strangulation.  The omental adhesions are fairly dense and circumferential.  These are all carefully lysed sharply.  She does have some varicosities within her omentum and the spleen with minimal manipulation.  Ultimately, all adhesions were lysed and a partial omentectomy was carried out using the LigaSure device.  The cut edge of the omentum was inspected noted to be hemostatic.  The transverse colon again is well perfused in appearance.  We are  unable to fully reduce it back into the abdomen.  The hernia defect is then widened to facilitate reduction of the transverse colon.  After this we are able to reduce it without difficulty.  Excess hernia sac is excised.  It is thickened and edematous.    We then turned our attention to closure of the defect.  Given her preoperative discussions and findings, opted to proceed with a primary repair of this defect.  It measures approximately 7 cm in length and 3 cm in width.  This is then closed with interrupted #1 Novafil sutures.  Pneumoperitoneum was then reestablished and adequacy of closure was confirmed.  It is airtight.  The repair is palpated and the hernia is noted to have been obliterated.  The 5 mm trochars were removed.  2-0 Vicryl deep dermal sutures were used to approximate the subcutaneous tissue.  Sponge, needle and instrument counts were reported correct x2.  We then turned our attention to skin closure.  This is done using skin staples.  A small honeycomb dressing was placed over the midline incision and Telfa/Tegaderms over the laparoscopic port sites.  An abdominal binder was placed.  She has had awakened from anesthesia, extubated, transferred to a stretcher for transport to PACU in satisfactory condition.

## 2020-09-15 ENCOUNTER — Encounter (HOSPITAL_COMMUNITY): Payer: Self-pay | Admitting: Surgery

## 2020-09-15 LAB — CBC
HCT: 35.2 % — ABNORMAL LOW (ref 36.0–46.0)
Hemoglobin: 11.2 g/dL — ABNORMAL LOW (ref 12.0–15.0)
MCH: 27.9 pg (ref 26.0–34.0)
MCHC: 31.8 g/dL (ref 30.0–36.0)
MCV: 87.6 fL (ref 80.0–100.0)
Platelets: 283 10*3/uL (ref 150–400)
RBC: 4.02 MIL/uL (ref 3.87–5.11)
RDW: 14.6 % (ref 11.5–15.5)
WBC: 10.9 10*3/uL — ABNORMAL HIGH (ref 4.0–10.5)
nRBC: 0 % (ref 0.0–0.2)

## 2020-09-15 LAB — SURGICAL PATHOLOGY

## 2020-09-15 MED ORDER — ACETAMINOPHEN 500 MG PO TABS: 1000.0000 mg | ORAL_TABLET | Freq: Four times a day (QID) | ORAL | 0 refills | Status: DC

## 2020-09-15 MED ORDER — OXYCODONE HCL 10 MG PO TABS: 10.0000 mg | ORAL_TABLET | Freq: Four times a day (QID) | ORAL | 0 refills | Status: AC | PRN

## 2020-09-15 NOTE — Progress Notes (Signed)
Progress Note  1 Day Post-Op  Subjective: CC: tolerating full liquids well without nausea or emesis. Having some gas pain but passing flatus. No BM. Ambulating  Objective: Vital signs in last 24 hours: Temp:  [98.1 F (36.7 C)-98.6 F (37 C)] 98.6 F (37 C) (06/17 0524) Pulse Rate:  [67-84] 76 (06/17 0524) Resp:  [18] 18 (06/17 0524) BP: (101-105)/(52-57) 101/57 (06/17 0524) SpO2:  [93 %-96 %] 96 % (06/17 0524) Last BM Date:  (PTA)  Intake/Output from previous day: 06/16 0701 - 06/17 0700 In: 369.4 [I.V.:369.4] Out: -  Intake/Output this shift: No intake/output data recorded.  PE: General: pleasant, WD, female who is laying in bed in NAD HEENT: head is normocephalic, atraumatic. Mouth is pink and moist Heart: Palpable radial pulses - no cyanosis Lungs: Respiratory effort nonlabored Abd: soft, ND, appropriate post operative TTP, lap incision sites with staples intact and no erythema or discharge. Umbilical incision with honeycomb bandage in place with some dried blood. Visible staples intact - no erythema or discharge MS: all 4 extremities are symmetrical with no cyanosis, clubbing, or edema. Skin: warm and dry with no masses, lesions, or rashes Psych: A&Ox3 with an appropriate affect.    Lab Results:  Recent Labs    09/13/20 2127 09/14/20 0746  WBC 9.0 15.0*  HGB 12.9 12.2  HCT 40.8 38.4  PLT 310 280   BMET Recent Labs    09/13/20 2127 09/14/20 0746  NA 138 137  K 3.8 4.0  CL 101 102  CO2 27 26  GLUCOSE 174* 155*  BUN 17 13  CREATININE 0.72 0.67  CALCIUM 9.0 8.7*   PT/INR No results for input(s): LABPROT, INR in the last 72 hours. CMP     Component Value Date/Time   NA 137 09/14/2020 0746   NA 140 06/27/2020 1020   K 4.0 09/14/2020 0746   CL 102 09/14/2020 0746   CO2 26 09/14/2020 0746   GLUCOSE 155 (H) 09/14/2020 0746   BUN 13 09/14/2020 0746   BUN 11 06/27/2020 1020   CREATININE 0.67 09/14/2020 0746   CALCIUM 8.7 (L) 09/14/2020 0746    PROT 6.5 09/13/2020 2127   PROT 7.3 06/27/2020 1020   ALBUMIN 3.5 09/13/2020 2127   ALBUMIN 4.2 06/27/2020 1020   AST 17 09/13/2020 2127   ALT 15 09/13/2020 2127   ALKPHOS 99 09/13/2020 2127   BILITOT 0.6 09/13/2020 2127   BILITOT 0.4 06/27/2020 1020   GFRNONAA >60 09/14/2020 0746   GFRAA >60 01/18/2019 1014   Lipase     Component Value Date/Time   LIPASE 24 09/13/2020 2127       Studies/Results: CT Abdomen Pelvis W Contrast  Result Date: 09/14/2020 CLINICAL DATA:  Left flank pain migrating to umbilical pain EXAM: CT ABDOMEN AND PELVIS WITH CONTRAST TECHNIQUE: Multidetector CT imaging of the abdomen and pelvis was performed using the standard protocol following bolus administration of intravenous contrast. CONTRAST:  173mL OMNIPAQUE IOHEXOL 300 MG/ML  SOLN COMPARISON:  CT 04/14/2020 FINDINGS: Lower chest: Dependent atelectatic changes in the lung bases with more bandlike opacities favoring subsegmental atelectasis or scarring. Lung bases are otherwise clear. Normal heart size. No pericardial effusion. Three-vessel coronary artery atherosclerosis. Calcifications upon the aortic leaflets. Hepatobiliary: No worrisome focal liver lesions. Smooth liver surface contour. Normal hepatic attenuation. Normal gallbladder and biliary tree. Pancreas: No pancreatic ductal dilatation or surrounding inflammatory changes. Spleen: Normal in size. No concerning splenic lesions. Adrenals/Urinary Tract: Stable 1.8 cm nodule in the body of the left  adrenal gland previously measuring 7 Hounsfield units on unenhanced imaging compatible with adrenal adenoma. No new or worrisome adrenal lesion. Stable mild bilateral perinephric stranding is nonspecific. Kidneys are normally located with symmetric enhancement and excretion. No suspicious renal lesion, urolithiasis or hydronephrosis. Urinary bladder is unremarkable for the degree of distention. Stomach/Bowel: Distal esophagus, stomach and duodenum are unremarkable. No  small bowel thickening or dilatation. Normal appendix in the right lower quadrant. Portion of the transverse colon protrudes into a large multilobulated ventral hernia defect with some mild pericolonic and omental fat stranding both proximal to the hernia sac and within the herniated contents of cells. No resulting mechanical obstruction is seen. No abnormal bowel wall enhancement to suggest frank vascular compromise. Scattered colonic diverticula without focal inflammation to suggest diverticulitis. Vascular/Lymphatic: Atherosclerotic calcifications within the abdominal aorta and branch vessels. No aneurysm or ectasia. No enlarged abdominopelvic lymph nodes. Reproductive: Anteverted uterus. Probable nabothian cysts towards the level of the cervix. No concerning adnexal lesions. Other: Fat and large bowel containing umbilical hernia with stranding of the herniated fat contents as well as about the colon and omentum just external to the hernia sac. No other bowel containing hernia. No free abdominopelvic fluid or air. Musculoskeletal: Multilevel degenerative changes are present in the imaged portions of the spine. Features most pronounced L4-5, L5-S1. Additional degenerative changes at the SI joints and symphysis pubis. No acute osseous abnormality or suspicious osseous lesion. IMPRESSION: 1. Umbilical hernia containing portion of the transverse colon demonstrating hazy stranding about the herniated fat and colonic contents as well as just external to the hernia sac which could reflect a degree of strangulation. No significant resulting obstruction is seen. Slightly increasing hazy stranding in comparison to the CT 09/12/2020. Correlate for point tenderness and reducibility. 2. No other CT abdomen study to provide cause for patient's symptoms. Specifically, no acute urinary tract abnormality. 3. Diverticulosis without evidence of acute diverticulitis. 4.  Aortic Atherosclerosis (ICD10-I70.0). 5. Stable left adrenal  nodule. Electronically Signed   By: Lovena Le M.D.   On: 09/14/2020 00:41    Anti-infectives: Anti-infectives (From admission, onward)    Start     Dose/Rate Route Frequency Ordered Stop   09/14/20 0315  cefoTEtan (CEFOTAN) 2 g in sodium chloride 0.9 % 100 mL IVPB        2 g 200 mL/hr over 30 Minutes Intravenous To Surgery 09/14/20 0300 09/14/20 1610        Assessment/Plan  Incarcerated ventral hernia  -POD1 diagnostic lap, LOA, partial omentectomy, ventral hernia repair CW 6/16 - abdominal binder when OOB - tolerating FLD and passing flatus. Advance to soft diet - ambulate - simethicone for gas pain - CBC pending at time of rounds  FEN: soft diet, decrease IVF ID: cefotetan periop VTE: subq heparin  HLD - home Crestor HTN - lisinopril-HCTz OSA - cpap GERD - home Prilosec Pre DM - monitor glucose   Disp: possible discharge this afternoon or tomorrow am pending diet and WBC    LOS: 1 day    Winferd Humphrey, [facility] Surgery 09/15/2020, 9:36 AM Please see Amion for pager number during day hours 7:00am-4:30pm

## 2020-09-15 NOTE — Discharge Instructions (Addendum)
[facility] _______[facility]  UMBILICAL OR INGUINAL HERNIA REPAIR: POST OP INSTRUCTIONS  Always review your discharge instruction sheet given to you by the facility where your surgery was performed. IF YOU HAVE DISABILITY OR FAMILY LEAVE FORMS, YOU MUST BRING THEM TO THE OFFICE FOR PROCESSING.   DO NOT GIVE THEM TO YOUR DOCTOR.  1. A  prescription for pain medication may be given to you upon discharge.  Take your pain medication as prescribed, if needed.  If narcotic pain medicine is not needed, then you may take acetaminophen (Tylenol) or ibuprofen (Advil) as needed. 2. Take your usually prescribed medications unless otherwise directed. If you need a refill on your pain medication, please contact your pharmacy.  They will contact our office to request authorization. Prescriptions will not be filled after 5 pm or on week-ends. 3. You should follow a light diet the first 24 hours after arrival home, such as soup and crackers, etc.  Be sure to include lots of fluids daily.  Resume your normal diet the day after surgery. 4.Most patients will experience some swelling and bruising around the umbilicus or in the groin and scrotum.  Ice packs and reclining will help.  Swelling and bruising can take several days to resolve.  6. It is common to experience some constipation if taking pain medication after surgery.  Increasing fluid intake and taking a stool softener (such as Colace) will usually help or prevent this problem from occurring.  A mild laxative (Milk of Magnesia or Miralax) should be taken according to package directions if there are no bowel movements after 48 hours. 7. Unless discharge instructions indicate otherwise, you may remove your bandages 24-48 hours after surgery, and you may shower at that time.  You may have steri-strips (small skin tapes) in place directly over the incision.  These strips should be left on the skin for 7-10 days.  If your surgeon used skin glue on the  incision, you may shower in 24 hours.  The glue will flake off over the next 2-3 weeks.  Any sutures or staples will be removed at the office during your follow-up visit. 8. ACTIVITIES:  You may resume regular (light) daily activities beginning the next day--such as daily self-care, walking, climbing stairs--gradually increasing activities as tolerated.  You may have sexual intercourse when it is comfortable.  Refrain from any heavy lifting or straining until approved by your doctor.  a.You may drive when you are no longer taking prescription pain medication, you can comfortably wear a seatbelt, and you can safely maneuver your car and apply brakes. b.RETURN TO WORK:   _____________________________________________  9.You should see your doctor in the office for a follow-up appointment approximately 2-3 weeks after your surgery.  Make sure that you call for this appointment within a day or two after you arrive home to insure a convenient appointment time. 10.OTHER INSTRUCTIONS: _________________________    _____________________________________  WHEN TO CALL YOUR DOCTOR: Fever over 101.0 Inability to urinate Nausea and/or vomiting Extreme swelling or bruising Continued bleeding from incision. Increased pain, redness, or drainage from the incision  The clinic staff is available to answer your questions during regular business hours.  Please don't hesitate to call and ask to speak to one of the nurses for clinical concerns.  If you have a medical emergency, go to the nearest emergency room or call 911.  A surgeon from [facility] is always on call at the hospital   [address], [address],   [place], [place]  [address] ?  [address] [address], [place], [place]   [address] [phone] ? [phone] ? FAX [phone] Web site: [email or web address]  

## 2020-09-15 NOTE — Discharge Summary (Signed)
[facility] Discharge Summary   Patient ID: Chelsea Soto MRN: [id number] DOB/AGE: 1965/09/30 55 y.o.  Admit date: 09/13/2020 Discharge date: 09/25/2020  Admitting Diagnosis: Incarcerated Ventral hernia  Discharge Diagnosis Incarcerated ventral hernia s/p diagnostic lap, LOA, partial omentectomy, ventral hernia repair  Imaging: No results found.  Procedures Dr. Dema Severin (09/14/20) - Repair of incarcerated ventral hernia containing transverse colon, Diagnostic laparoscopy, Lysis of adhesions x 60 minutes, Partial omentectomy  Hospital Course:  Chelsea Soto is an 55 y.o. female hx of morbid obesity, OSA, HTN, HLD, GERD presented to ED 6/14 with abdominal pain.  She underwent work-up and was found to have hernia on examination.  It was not "incarcerated by CT findings."  On 6/16, she represented to the hospital with ongoing severe periumbilical pain with associated constipation. She had a repeat CT scan and further bedside attempts at reduction had been unsuccessful.   Patient was admitted and underwent procedure listed above.  Tolerated procedure well and was transferred to the floor.  Diet was advanced as tolerated.  On POD1, the patient was voiding well, tolerating diet, ambulating well, pain well controlled, vital signs stable, incisions c/d/i and felt stable for discharge home.  Patient will follow up in our office in 1 week for staple removal and 3 weeks for post operative follow up. She knows to call with questions or concerns.    She was discharged with oxycodone #25/0 to alternate with tylenol for pain control.  I or a member of my team have reviewed this patient in the Controlled Substance Database.   Allergies as of 09/15/2020   No Known Allergies      Medication List     STOP taking these medications    HYDROcodone-acetaminophen 5-325 MG tablet Commonly known as: NORCO/VICODIN   Molnupiravir 200 MG Caps       TAKE these medications     acetaminophen 500 MG tablet Commonly known as: TYLENOL Take 2 tablets (1,000 mg total) by mouth every 6 (six) hours.   ALPRAZolam 0.5 MG tablet Commonly known as: XANAX Take 0.5 mg by mouth as needed for anxiety.   aspirin 81 MG EC tablet Take by mouth.   B-D UF III MINI PEN NEEDLES 31G X 5 MM Misc Generic drug: Insulin Pen Needle See admin instructions.   dicyclomine 20 MG tablet Commonly known as: BENTYL Take 1 tablet (20 mg total) by mouth 2 (two) times daily.   IRON PO Take 1 tablet by mouth daily.   lisinopril-hydrochlorothiazide 20-25 MG tablet Commonly known as: ZESTORETIC Take 1 tablet by mouth daily.   meloxicam 15 MG tablet Commonly known as: MOBIC Take 15 mg by mouth daily.   Nystatin Powd Apply liberally to affected area 2 times per day   omeprazole 40 MG capsule Commonly known as: PRILOSEC Take 40 mg by mouth at bedtime.   Oxycodone HCl 10 MG Tabs Take 1 tablet (10 mg total) by mouth every 6 (six) hours as needed for up to 7 days for moderate pain (not relieved by tylenol or ibuprofen).   rosuvastatin 20 MG tablet Commonly known as: CRESTOR Take 20 mg by mouth every evening.   terbinafine 1 % cream Commonly known as: LAMISIL Apply to affected area twice daily until rash gone, then apply 2 more days.   Victoza 18 MG/3ML Sopn Generic drug: liraglutide Inject 1.2 mg into the skin daily.   VITAMIN C PO Take 1 capsule by mouth daily.   Vitamin D-3 125 MCG (5000 UT) Tabs  Take 5,000 Units by mouth daily.   ZINC PO Take 1 tablet by mouth daily.          Follow-up Information     Ileana Roup, MD Follow up in 3 week(s).   Specialties: General Surgery, Colon and Rectal Surgery Why: Please follow up on 7/12 at 9:35 am for surgical follow up with Dr. Genice Rouge information: [place] [phone]         [facility] [place] Follow up.   Specialty: General Surgery Why: Please follow up  on 6/27 at 2:00 pm for nurse visit and removal of your staples Please arrive 30 min prior to your appt to complete paperwork and bring photo ID and insurance card Contact information: [address] [place] [phone]                Signed: Caroll Rancher [facility] 09/18/2020, 12:01 PM Please see Amion for pager number during day hours 7:00am-4:30pm

## 2020-11-07 ENCOUNTER — Telehealth (INDEPENDENT_AMBULATORY_CARE_PROVIDER_SITE_OTHER): Payer: 59 | Admitting: Psychology

## 2021-04-23 ENCOUNTER — Encounter: Payer: Self-pay | Admitting: Internal Medicine

## 2021-06-01 ENCOUNTER — Encounter: Payer: Self-pay | Admitting: *Deleted

## 2021-06-05 ENCOUNTER — Encounter: Payer: Self-pay | Admitting: Internal Medicine

## 2021-06-05 ENCOUNTER — Ambulatory Visit: Payer: 59 | Admitting: Internal Medicine

## 2021-06-05 VITALS — BP 132/84 | HR 78 | Ht 64.0 in | Wt 327.2 lb

## 2021-06-05 DIAGNOSIS — Z8371 Family history of colonic polyps: Secondary | ICD-10-CM | POA: Diagnosis not present

## 2021-06-05 DIAGNOSIS — K439 Ventral hernia without obstruction or gangrene: Secondary | ICD-10-CM | POA: Diagnosis not present

## 2021-06-05 DIAGNOSIS — Z800 Family history of malignant neoplasm of digestive organs: Secondary | ICD-10-CM | POA: Diagnosis not present

## 2021-06-05 DIAGNOSIS — Z83719 Family history of colon polyps, unspecified: Secondary | ICD-10-CM

## 2021-06-05 NOTE — Patient Instructions (Signed)
You have been scheduled for a CT virtual colonoscopy at [facility] ( [address] location). Your appointment is scheduled for 07/13/21 at 9:00 am. Please go to [facility] at least 72 hours prior to your exam to get your prep and instructions. If you need to reschedule your appointment or have specific questions regarding this test, please contact [facility] at [phone]. ? ?We will request records from your GI doctor in [place]. ? ?If you are age 58 or older, your body mass index should be between 23-30. Your Body mass index is 56.17 kg/m?Marland Kitchen If this is out of the aforementioned range listed, please consider follow up with your Primary Care Provider. ? ?If you are age 30 or younger, your body mass index should be between 19-25. Your Body mass index is 56.17 kg/m?Marland Kitchen If this is out of the aformentioned range listed, please consider follow up with your Primary Care Provider.  ? ?________________________________________________________ ? ?The [facility] providers would like to encourage you to use [facility] to communicate with providers for non-urgent requests or questions.  Due to long hold times on the telephone, sending your provider a message by [facility] may be a faster and more efficient way to get a response.  Please allow 48 business hours for a response.  Please remember that this is for non-urgent requests.  ?_______________________________________________________ ?Due to recent changes in healthcare laws, you may see the results of your imaging and laboratory studies on MyChart before your provider has had a chance to review them.  We understand that in some cases there may be results that are confusing or concerning to you. Not all laboratory results come back in the same time frame and the provider may be waiting for multiple results in order to interpret others.  Please give [place] 48 hours in order for your provider to thoroughly review all the results before contacting the office  for clarification of your results.  ? ?

## 2021-06-06 ENCOUNTER — Encounter: Payer: Self-pay | Admitting: Internal Medicine

## 2021-06-06 NOTE — Progress Notes (Signed)
HPI: Chelsea Soto is a 56 year old female with a past medical history of family history of colon cancer (maternal uncle and maternal grandmother all before age 76), family history of colon polyps in her mother and 3 sisters, personal history of incarcerated ventral hernia containing transverse colon status post previous repair in June 2022 with recurrent ventral hernia and umbilical hernia who is seen to discuss colon cancer screening and further discussion of large ventral hernia.  She is here alone today.  Her previous GI care has been performed in [place] where she previously lived.  She recalls 4 or 5 prior colonoscopies starting around age 74 which have been normal.  She also had a negative Cologuard in January 2021.  She has been having regular bowel movements though her ventral hernia is uncomfortable and at times painful.  Her abdominal binder is cumbersome and difficult to wear and does not provide much benefit.  Of late she has had some mild constipation but is dealing with this.  No blood in stool or melena.  She has discussed the ventral hernia with Annye English at [facility].  He has recommended weight loss prior to hernia repair.  She has met with primary care and discussed Ozempic and medical weight loss but also considering gastric bypass or surgical obesity procedure.  She is undecided on which avenue though her family would prefer attempt at medical weight loss before gastric surgery.  She feels her last colonoscopy was roughly 5 years ago.  Past Medical History:  Diagnosis Date   Anxiety    Aortic atherosclerosis (HCC)    Back pain    Depression    Diverticulosis    Dyspnea    chronic (with exertion)   GERD (gastroesophageal reflux disease)    Hiatal hernia    Hip pain    Hypercholesterolemia    Hypertension    Joint pain    Knee pain    Morbid obesity with BMI of 50.0-59.9, adult (HCC)    Post-menopausal bleeding    Prediabetes    Sleep apnea     Severe, on BiPAP   Umbilical hernia    Ventral hernia    Vitamin D deficiency    Wears contact lenses     Past Surgical History:  Procedure Laterality Date   COLONOSCOPY     DILATATION & CURETTAGE/HYSTEROSCOPY WITH MYOSURE N/A 01/21/2019   Procedure: DILATATION & CURETTAGE/HYSTEROSCOPY WITH possible  MYOSURE;  Surgeon: [name], MD;  Location: [place];  Service: Gynecology;  Laterality: N/A;   LAPAROSCOPY N/A 09/14/2020   Procedure: DIAGNOSTIC LAPAROSCOPY;  Surgeon: Ileana Roup, MD;  Location: [place];  Service: General;  Laterality: N/A;   LYSIS OF ADHESION N/A 09/14/2020   Procedure: LYSIS OF ADHESION;  Surgeon: Ileana Roup, MD;  Location: [place];  Service: General;  Laterality: N/A;   VENTRAL HERNIA REPAIR N/A 09/14/2020   Procedure: OPEN REPAIR OF INCARCERATED VENTRAL HERNIA  ADULT;  Surgeon: Ileana Roup, MD;  Location: [place];  Service: General;  Laterality: N/A;   WISDOM TOOTH EXTRACTION      Outpatient Medications Prior to Visit  Medication Sig Dispense Refill   acetaminophen (TYLENOL) 500 MG tablet Take 2 tablets (1,000 mg total) by mouth every 6 (six) hours. 30 tablet 0   Ascorbic Acid (VITAMIN C PO) Take 1 capsule by mouth daily.     Cholecalciferol (VITAMIN D-3) 125 MCG (5000 UT) TABS Take 5,000 Units by mouth daily.     dicyclomine (  BENTYL) 20 MG tablet Take 1 tablet (20 mg total) by mouth 2 (two) times daily. 20 tablet 0   Ferrous Sulfate (IRON PO) Take 1 tablet by mouth daily.     Insulin Pen Needle (B-D UF III MINI PEN NEEDLES) 31G X 5 MM MISC See admin instructions.     lisinopril-hydrochlorothiazide (ZESTORETIC) 20-25 MG tablet Take 1 tablet by mouth daily.     meloxicam (MOBIC) 15 MG tablet Take 15 mg by mouth daily.     Multiple Vitamins-Minerals (ZINC PO) Take 1 tablet by mouth daily.     omeprazole (PRILOSEC) 40 MG capsule Take 40 mg by mouth at bedtime.     rosuvastatin (CRESTOR) 20 MG tablet Take 20 mg by mouth every evening.      Nystatin POWD Apply liberally to affected area 2 times per day 1 each 0   liraglutide (VICTOZA) 18 MG/3ML SOPN Inject 1.2 mg into the skin daily.     terbinafine (LAMISIL) 1 % cream Apply to affected area twice daily until rash gone, then apply 2 more days. 30 g 1   No facility-administered medications prior to visit.    No Known Allergies  Family History  Problem Relation Age of Onset   Breast cancer Mother    Colon polyps Mother    Diabetes Mother    Hypertension Mother    Heart disease Mother    Cancer Mother    Depression Mother    Anxiety disorder Mother    Obesity Mother    CAD Father    Hypertension Father    Hyperlipidemia Father    Heart disease Father    Sleep apnea Father    Stroke Sister     Social History   Tobacco Use   Smoking status: Never   Smokeless tobacco: Never  Vaping Use   Vaping Use: Never used  Substance Use Topics   Alcohol use: Yes    Comment: rare   Drug use: Never    ROS: As per history of present illness, otherwise negative  BP 132/84 (BP Location: Left Arm, Patient Position: Sitting, Cuff Size: Normal) Comment (Patient Position): left forearm   Pulse 78    Ht 5\' 4"  (1.626 m)    Wt (!) 327 lb 4 oz (148.4 kg)    SpO2 95%    BMI 56.17 kg/m  Gen: awake, alert, NAD HEENT: anicteric CV: RRR, no mrg Pulm: CTA b/l Abd: soft, obese, large ventral hernia which is soft, umbilical hernia present also, nontender,  +BS throughout Ext: no c/c/e Neuro: nonfocal   RELEVANT LABS AND IMAGING: CBC    Component Value Date/Time   WBC 10.9 (H) 09/15/2020 1002   RBC 4.02 09/15/2020 1002   HGB 11.2 (L) 09/15/2020 1002   HGB 13.5 06/27/2020 1020   HCT 35.2 (L) 09/15/2020 1002   HCT 41.1 06/27/2020 1020   PLT 283 09/15/2020 1002   PLT 337 06/27/2020 1020   MCV 87.6 09/15/2020 1002   MCV 85 06/27/2020 1020   MCH 27.9 09/15/2020 1002   MCHC 31.8 09/15/2020 1002   RDW 14.6 09/15/2020 1002   RDW 13.8 06/27/2020 1020   LYMPHSABS 2.9 09/13/2020  2127   LYMPHSABS 3.0 06/27/2020 1020   MONOABS 0.5 09/13/2020 2127   EOSABS 0.3 09/13/2020 2127   EOSABS 0.3 06/27/2020 1020   BASOSABS 0.0 09/13/2020 2127   BASOSABS 0.1 06/27/2020 1020    CMP     Component Value Date/Time   NA 137 09/14/2020 0746  NA 140 06/27/2020 1020   K 4.0 09/14/2020 0746   CL 102 09/14/2020 0746   CO2 26 09/14/2020 0746   GLUCOSE 155 (H) 09/14/2020 0746   BUN 13 09/14/2020 0746   BUN 11 06/27/2020 1020   CREATININE 0.67 09/14/2020 0746   CALCIUM 8.7 (L) 09/14/2020 0746   PROT 6.5 09/13/2020 2127   PROT 7.3 06/27/2020 1020   ALBUMIN 3.5 09/13/2020 2127   ALBUMIN 4.2 06/27/2020 1020   AST 17 09/13/2020 2127   ALT 15 09/13/2020 2127   ALKPHOS 99 09/13/2020 2127   BILITOT 0.6 09/13/2020 2127   BILITOT 0.4 06/27/2020 1020   GFRNONAA >60 09/14/2020 0746   GFRAA >60 01/18/2019 1014   Cologuard neg -- Jan 2021  CT ABD/PELVIS with IV CONTRAST -- 12/2020 TECHNIQUE: CT abdomen and pelvis. 60 cc Isovue-[id number]. Sagittal and coronal reconstructions with maximum intensity projections were evaluated on the workstation and stored in PACS. Radiation dose reduction was utilized, (automated exposure control, mA or kV adjustment based on patient size, or iterative image reconstruction).   COMPARISON: None.   INDICATION:Left lower quadrant pain   FINDINGS:   LOWER THORAX:   Trace atelectasis in the right costophrenic gutter. Normal heart size. No pericardial effusion.   ABDOMEN:   Liver:Normal.  Gallbladder:Normal.  Spleen:Normal  Pancreas:Normal.  Adrenals:Normal right adrenal. 2 cm left adrenal nodule has an attenuation value of 25 units, technically indeterminate but statistically most likely an adenoma..  Kidneys: Normal  Ureters:  Ureters normal in course and caliber.  Bladder: Normal  Reproductive: No evidence of pelvic mass or free fluid.  Vessels: Vessels are normal in caliber.  Lymph nodes:No adenopathy.  GI: Periumbilical hernia just superior  to the umbilicus contains mesenteric fat. Adjacent umbilical hernia contains a nonobstructed segment of transverse colon. No evidence of bowel obstruction. Sigmoid dominant diverticulosis. Normal caliber appendix. No ascites.  MSK:No acute or aggressive osseous abnormalities. Moderate degenerative disc disease L4-S1.    IMPRESSION:  1. Supraumbilical hernia contains fat, umbilical hernia contains nonobstructed transverse colon.  2. 2 cm left adrenal nodule, technically indeterminate but statistically an adenoma   Electronically Signed by: Delma Post on 01/19/2021 4:07 PM   ASSESSMENT/PLAN: 56 year old female with a past medical history of family history of colon cancer (maternal uncle and maternal grandmother all before age 86), family history of colon polyps in her mother and 3 sisters, personal history of incarcerated ventral hernia containing transverse colon status post previous repair in June 2022 with recurrent ventral hernia and umbilical hernia who is seen in consult at the request of Fonnie Jarvis, NP to discuss colon cancer screening and further discussion of large ventral hernia.  Family history of colon cancer/family history of colon polyps/elevated risk screening --she paid for a Cologuard in 2021 as a backup to previous colonoscopies performed in [place].  This was negative and she also does not have a history of colon polyps.  She feels that her last colonoscopy was roughly 5 years ago.  She would be indicated for a 5-year colonoscopy but I am concerned that colonoscopy may not be possible due to the large recurrent ventral hernia containing transverse colon.  Certainly any attempted colonoscopy would be higher risk for her due to this hernia as well as her obesity and sleep apnea.  We discussed this at length today.  I have recommended virtual colonoscopy as a surrogate until she can have meaningful weight loss and ventral hernia repair --Virtual colonoscopy given difficulty for  optical colonoscopy in  the setting of ventral hernia containing colon --She is indicated for screening every 5 years based on her family history --Obtained prior colonoscopy records from [place]  2.  Morbid obesity and large ventral hernia --we discussed both medical weight loss using likely Ozempic plus diet and exercise.  We also discussed bariatric surgery including lap band, gastric sleeve and Roux-en-Y gastric bypass.  Given the significant amount of weight that she would likely need to lose Roux-en-Y is likely preferable.  She will continue to discuss this with bariatric surgery as well as go through the preoperative course is necessary.  She will also continue to discuss this with her family.      QT:[id number], Blaine Hamper, [place] [place],  [place] [id number]

## 2021-07-13 ENCOUNTER — Inpatient Hospital Stay: Admission: RE | Admit: 2021-07-13 | Payer: 59 | Source: Ambulatory Visit

## 2021-08-02 ENCOUNTER — Other Ambulatory Visit: Payer: Self-pay

## 2021-08-02 ENCOUNTER — Emergency Department (HOSPITAL_BASED_OUTPATIENT_CLINIC_OR_DEPARTMENT_OTHER): Payer: 59

## 2021-08-02 ENCOUNTER — Encounter (HOSPITAL_BASED_OUTPATIENT_CLINIC_OR_DEPARTMENT_OTHER): Payer: Self-pay

## 2021-08-02 ENCOUNTER — Emergency Department (HOSPITAL_BASED_OUTPATIENT_CLINIC_OR_DEPARTMENT_OTHER)
Admission: EM | Admit: 2021-08-02 | Discharge: 2021-08-02 | Disposition: A | Discharge status: Home / Self Care | Payer: 59 | Attending: Emergency Medicine | Admitting: Emergency Medicine

## 2021-08-02 ENCOUNTER — Emergency Department (HOSPITAL_BASED_OUTPATIENT_CLINIC_OR_DEPARTMENT_OTHER): Payer: 59 | Admitting: Radiology

## 2021-08-02 DIAGNOSIS — I10 Essential (primary) hypertension: Secondary | ICD-10-CM | POA: Insufficient documentation

## 2021-08-02 DIAGNOSIS — Z79899 Other long term (current) drug therapy: Secondary | ICD-10-CM | POA: Diagnosis not present

## 2021-08-02 DIAGNOSIS — R791 Abnormal coagulation profile: Secondary | ICD-10-CM | POA: Diagnosis not present

## 2021-08-02 DIAGNOSIS — Z794 Long term (current) use of insulin: Secondary | ICD-10-CM | POA: Diagnosis not present

## 2021-08-02 DIAGNOSIS — M546 Pain in thoracic spine: Secondary | ICD-10-CM | POA: Insufficient documentation

## 2021-08-02 LAB — COMPREHENSIVE METABOLIC PANEL
ALT: 11 U/L (ref 0–44)
AST: 11 U/L — ABNORMAL LOW (ref 15–41)
Albumin: 4.1 g/dL (ref 3.5–5.0)
Alkaline Phosphatase: 83 U/L (ref 38–126)
Anion gap: 11 (ref 5–15)
BUN: 17 mg/dL (ref 6–20)
CO2: 25 mmol/L (ref 22–32)
Calcium: 9.3 mg/dL (ref 8.9–10.3)
Chloride: 102 mmol/L (ref 98–111)
Creatinine, Ser: 0.52 mg/dL (ref 0.44–1.00)
GFR, Estimated: >60 mL/min (ref >60)
Glucose, Bld: 107 mg/dL — ABNORMAL HIGH (ref 70–99)
Potassium: 4 mmol/L (ref 3.5–5.1)
Sodium: 138 mmol/L (ref 135–145)
Total Bilirubin: 0.5 mg/dL (ref 0.3–1.2)
Total Protein: 7.1 g/dL (ref 6.5–8.1)

## 2021-08-02 LAB — TROPONIN I (HIGH SENSITIVITY): Troponin I (High Sensitivity): 3 ng/L (ref <18)

## 2021-08-02 LAB — URINALYSIS, ROUTINE W REFLEX MICROSCOPIC
Bilirubin Urine: NEGATIVE
Glucose, UA: NEGATIVE mg/dL
Hgb urine dipstick: NEGATIVE
Ketones, ur: NEGATIVE mg/dL
Leukocytes,Ua: NEGATIVE
Nitrite: NEGATIVE
Protein, ur: NEGATIVE mg/dL
Specific Gravity, Urine: 1.016 (ref 1.005–1.030)
pH: 6 (ref 5.0–8.0)

## 2021-08-02 LAB — D-DIMER, QUANTITATIVE: D-Dimer, Quant: 0.58 ug/mL-FEU — ABNORMAL HIGH (ref 0.00–0.50)

## 2021-08-02 LAB — CBC WITH DIFFERENTIAL/PLATELET
Abs Immature Granulocytes: 0.03 10*3/uL (ref 0.00–0.07)
Basophils Absolute: 0 10*3/uL (ref 0.0–0.1)
Basophils Relative: 1 %
Eosinophils Absolute: 0.2 10*3/uL (ref 0.0–0.5)
Eosinophils Relative: 2 %
HCT: 39.9 % (ref 36.0–46.0)
Hemoglobin: 12.6 g/dL (ref 12.0–15.0)
Immature Granulocytes: 0 %
Lymphocytes Relative: 31 %
Lymphs Abs: 2.7 10*3/uL (ref 0.7–4.0)
MCH: 27 pg (ref 26.0–34.0)
MCHC: 31.6 g/dL (ref 30.0–36.0)
MCV: 85.6 fL (ref 80.0–100.0)
Monocytes Absolute: 0.6 10*3/uL (ref 0.1–1.0)
Monocytes Relative: 7 %
Neutro Abs: 5.2 10*3/uL (ref 1.7–7.7)
Neutrophils Relative %: 59 %
Platelets: 284 10*3/uL (ref 150–400)
RBC: 4.66 MIL/uL (ref 3.87–5.11)
RDW: 14.3 % (ref 11.5–15.5)
WBC: 8.7 10*3/uL (ref 4.0–10.5)
nRBC: 0 % (ref 0.0–0.2)

## 2021-08-02 MED ORDER — SODIUM CHLORIDE 0.9 % IV BOLUS
1000.0000 mL | Freq: Once | INTRAVENOUS | Status: AC
  Administered 2021-08-02: 1000 mL via INTRAVENOUS

## 2021-08-02 MED ORDER — MORPHINE SULFATE (PF) 4 MG/ML IV SOLN
4.0000 mg | Freq: Once | INTRAVENOUS | Status: AC
  Administered 2021-08-02: 4 mg via INTRAVENOUS
  Filled 2021-08-02: qty 1

## 2021-08-02 MED ORDER — HYDROCODONE-ACETAMINOPHEN 5-325 MG PO TABS
1.0000 | ORAL_TABLET | ORAL | 0 refills | Status: DC | PRN
Start: 2021-08-02 — End: 2022-08-06

## 2021-08-02 MED ORDER — IOHEXOL 350 MG/ML SOLN
100.0000 mL | Freq: Once | INTRAVENOUS | Status: AC | PRN
  Administered 2021-08-02: 100 mL via INTRAVENOUS

## 2021-08-02 MED ORDER — METHOCARBAMOL 500 MG PO TABS: 500.0000 mg | ORAL_TABLET | Freq: Two times a day (BID) | ORAL | 0 refills | Status: DC

## 2021-08-02 MED ORDER — ONDANSETRON HCL 4 MG/2ML IJ SOLN
4.0000 mg | Freq: Once | INTRAMUSCULAR | Status: AC
  Administered 2021-08-02: 4 mg via INTRAVENOUS
  Filled 2021-08-02: qty 2

## 2021-08-02 NOTE — ED Provider Notes (Signed)
?[facility] ?Provider Note ? ? ?CSN: [id number] ?Arrival date & time: 08/02/21  0641 ? ?  ? ?History ? ?Chief Complaint  ?Patient presents with  ? Back Pain  ? ? ?Chelsea Soto is a 56 y.o. female. ? ?Pt is a 56 yo female with a pmhx significant for htn, obesity, high cholesterol, gerd, anxiety, sleep apnea on bipap, and depression.  Pt said she woke up yesterday with left upper back pain behind her shoulder blade.  She thought it was a pulled muscle, but the pain worsened.  She said it is a deep pain.  She said it worsens with movement.  No sob.  No cp.  No n/v or radiation.  No f/c.  No cough.  No rash.  She has taken ibuprofen without improvement of sx.  She last took it around 0500.  She did not sleep very much due to the pain.  She denies trauma or any new activity. ? ? ?  ? ?Home Medications ?Prior to Admission medications   ?Medication Sig Start Date End Date Taking? Authorizing Provider  ?HYDROcodone-acetaminophen (NORCO/VICODIN) 5-325 MG tablet Take 1 tablet by mouth every 4 (four) hours as needed. 08/02/21  Yes Isla Pence, MD  ?methocarbamol (ROBAXIN) 500 MG tablet Take 1 tablet (500 mg total) by mouth 2 (two) times daily. 08/02/21  Yes Isla Pence, MD  ?acetaminophen (TYLENOL) 500 MG tablet Take 2 tablets (1,000 mg total) by mouth every 6 (six) hours. 09/15/20   Jill Alexanders, PA-C  ?Ascorbic Acid (VITAMIN C PO) Take 1 capsule by mouth daily.    [provider]  ?Cholecalciferol (VITAMIN D-3) 125 MCG (5000 UT) TABS Take 5,000 Units by mouth daily.    [provider]  ?dicyclomine (BENTYL) 20 MG tablet Take 1 tablet (20 mg total) by mouth 2 (two) times daily. 09/12/20   Caccavale, Sophia, PA-C  ?Ferrous Sulfate (IRON PO) Take 1 tablet by mouth daily.    [provider]  ?Insulin Pen Needle (B-D UF III MINI PEN NEEDLES) 31G X 5 MM MISC See admin instructions. 03/03/20   [provider]  ?lisinopril-hydrochlorothiazide (ZESTORETIC)  20-25 MG tablet Take 1 tablet by mouth daily.    [provider]  ?meloxicam (MOBIC) 15 MG tablet Take 15 mg by mouth daily.    [provider]  ?Multiple Vitamins-Minerals (ZINC PO) Take 1 tablet by mouth daily.    [provider]  ?omeprazole (PRILOSEC) 40 MG capsule Take 40 mg by mouth at bedtime.    [provider]  ?rosuvastatin (CRESTOR) 20 MG tablet Take 20 mg by mouth every evening.    [provider]  ?   ? ?Allergies    ?Patient has no known allergies.   ? ?Review of Systems   ?Review of Systems  ?Musculoskeletal:  Positive for back pain.  ?All other systems reviewed and are negative. ? ?Physical Exam ?Updated Vital Signs ?BP 120/72   Pulse 63   Temp 97.7 ?F (36.5 ?C) (Oral)   Resp 16   Ht '5\' 4"'$  (1.626 m)   Wt (!) 147.4 kg   SpO2 96%   BMI 55.79 kg/m?  ?Physical Exam ?Vitals and nursing note reviewed.  ?Constitutional:   ?   Appearance: Normal appearance.  ?HENT:  ?   Head: Normocephalic and atraumatic.  ?   Right Ear: External ear normal.  ?   Left Ear: External ear normal.  ?   Nose: Nose normal.  ?   Mouth/Throat:  ?  Mouth: Mucous membranes are moist.  ?   Pharynx: Oropharynx is clear.  ?Eyes:  ?   Extraocular Movements: Extraocular movements intact.  ?   Conjunctiva/sclera: Conjunctivae normal.  ?   Pupils: Pupils are equal, round, and reactive to light.  ?Cardiovascular:  ?   Rate and Rhythm: Normal rate and regular rhythm.  ?   Pulses: Normal pulses.  ?   Heart sounds: Normal heart sounds.  ?Pulmonary:  ?   Effort: Pulmonary effort is normal.  ?   Breath sounds: Normal breath sounds.  ?Abdominal:  ?   General: Abdomen is flat. Bowel sounds are normal.  ?   Palpations: Abdomen is soft.  ?Musculoskeletal:     ?   General: Normal range of motion.  ?     Arms: ? ?   Cervical back: Normal range of motion and neck supple.  ?Skin: ?   General: Skin is warm.  ?   Capillary Refill: Capillary refill takes less than 2 seconds.  ?   Comments: No rash in  location of pain  ?Neurological:  ?   General: No focal deficit present.  ?   Mental Status: She is alert and oriented to person, place, and time.  ?Psychiatric:     ?   Mood and Affect: Mood normal.     ?   Behavior: Behavior normal.  ? ? ?ED Results / Procedures / Treatments   ?Labs ?(all labs ordered are listed, but only abnormal results are displayed) ?Labs Reviewed  ?COMPREHENSIVE METABOLIC PANEL - Abnormal; Notable for the following components:  ?    Result Value  ? Glucose, Bld 107 (*)   ? AST 11 (*)   ? All other components within normal limits  ?D-DIMER, QUANTITATIVE - Abnormal; Notable for the following components:  ? D-Dimer, Quant 0.58 (*)   ? All other components within normal limits  ?CBC WITH DIFFERENTIAL/PLATELET  ?URINALYSIS, ROUTINE W REFLEX MICROSCOPIC  ?TROPONIN I (HIGH SENSITIVITY)  ?TROPONIN I (HIGH SENSITIVITY)  ? ? ?EKG ?EKG Interpretation ? ?Date/Time:  Thursday Aug 02 2021 07:31:16 EDT ?Ventricular Rate:  64 ?PR Interval:  181 ?QRS Duration: 102 ?QT Interval:  414 ?QTC Calculation: 428 ?R Axis:   58 ?Text Interpretation: Sinus rhythm Borderline T wave abnormalities No significant change since last tracing Confirmed by Isla Pence [phone]) on 08/02/2021 7:37:32 AM ? ?Radiology ?DG Chest 2 View ? ?Result Date: 08/02/2021 ?CLINICAL DATA:  Chest pain EXAM: CHEST - 2 VIEW COMPARISON:  09/12/2020 FINDINGS: Transverse diameter of heart is in the upper limits of normal. Lung fields are clear of any infiltrates or pulmonary edema. There is no pleural effusion or pneumothorax. IMPRESSION: No active cardiopulmonary disease. Electronically Signed   By: Elmer Picker M.D.   On: 08/02/2021 08:37  ? ?CT Angio Chest PE W and/or Wo Contrast ? ?Result Date: 08/02/2021 ?CLINICAL DATA:  Pulmonary embolism (PE) suspected, high prob. Chest pain EXAM: CT ANGIOGRAPHY CHEST WITH CONTRAST TECHNIQUE: Multidetector CT imaging of the chest was performed using the standard protocol during bolus administration of  intravenous contrast. Multiplanar CT image reconstructions and MIPs were obtained to evaluate the vascular anatomy. RADIATION DOSE REDUCTION: This exam was performed according to the departmental dose-optimization program which includes automated exposure control, adjustment of the mA and/or kV according to patient size and/or use of iterative reconstruction technique. CONTRAST:  124m OMNIPAQUE IOHEXOL 350 MG/ML SOLN COMPARISON:  09/12/2020 FINDINGS: Cardiovascular: Satisfactory opacification of the pulmonary arteries to the segmental level. No evidence of  pulmonary embolism. Thoracic aorta is nonaneurysmal. Scattered atherosclerotic calcification of the aorta and coronary arteries. Normal heart size. No pericardial effusion. Mediastinum/Nodes: No enlarged mediastinal, hilar, or axillary lymph nodes. Thyroid gland, trachea, and esophagus demonstrate no significant findings. Lungs/Pleura: Minimal bibasilar subsegmental atelectasis. Lungs are otherwise clear. No pleural effusion or pneumothorax. Upper Abdomen: Stable 1.7 cm benign left adrenal adenoma (5 HU). No acute findings within the included upper abdomen. Musculoskeletal: No chest wall abnormality. No acute or significant osseous findings. Review of the MIP images confirms the above findings. IMPRESSION: 1. Negative for pulmonary embolism or other acute intrathoracic findings. 2. Stable 1.7 cm benign left adrenal adenoma. 3. Aortic and coronary artery atherosclerosis (ICD10-I70.0). Electronically Signed   By: Davina Poke D.O.   On: 08/02/2021 09:50   ? ?Procedures ?Procedures  ? ? ?Medications Ordered in ED ?Medications  ?morphine (PF) 4 MG/ML injection 4 mg (4 mg Intravenous Given 08/02/21 0751)  ?ondansetron [facility]) injection 4 mg (4 mg Intravenous Given 08/02/21 0806)  ?sodium chloride 0.9 % bolus 1,000 mL (1,000 mLs Intravenous New Bag/Given 08/02/21 0750)  ?iohexol (OMNIPAQUE) 350 MG/ML injection 100 mL (100 mLs Intravenous Contrast Given 08/02/21 0928)   ? ? ?ED Course/ Medical Decision Making/ A&P ?  ?                        ?Medical Decision Making ?Amount and/or Complexity of Data Reviewed ?Labs: ordered. ?Radiology: ordered. ? ?Risk ?Prescription drug management. ? ?

## 2021-08-02 NOTE — ED Triage Notes (Signed)
Reports upper left sided back pain that began yesterday morning when waking up and steadily worsened throughout the day. Pain described as a deep, sharp lingering pain.  ?

## 2021-08-02 NOTE — ED Notes (Signed)
Dc instructions reviewed with patient. Patient voiced understanding. Dc with belongings.  °

## 2021-08-03 ENCOUNTER — Inpatient Hospital Stay: Admission: RE | Admit: 2021-08-03 | Payer: 59 | Source: Ambulatory Visit

## 2021-09-06 ENCOUNTER — Ambulatory Visit
Admission: RE | Admit: 2021-09-06 | Discharge: 2021-09-06 | Disposition: A | Discharge status: Home / Self Care | Payer: 59 | Source: Ambulatory Visit | Attending: Internal Medicine | Admitting: Internal Medicine

## 2021-09-06 DIAGNOSIS — Z800 Family history of malignant neoplasm of digestive organs: Secondary | ICD-10-CM

## 2021-09-06 DIAGNOSIS — K439 Ventral hernia without obstruction or gangrene: Secondary | ICD-10-CM

## 2021-09-09 ENCOUNTER — Encounter: Payer: Self-pay | Admitting: Internal Medicine

## 2021-11-07 ENCOUNTER — Encounter (INDEPENDENT_AMBULATORY_CARE_PROVIDER_SITE_OTHER): Payer: Self-pay

## 2021-11-25 ENCOUNTER — Encounter: Payer: Self-pay | Admitting: Internal Medicine

## 2021-11-28 ENCOUNTER — Other Ambulatory Visit: Payer: Self-pay

## 2021-11-28 MED ORDER — LINACLOTIDE 145 MCG PO CAPS
145.0000 ug | ORAL_CAPSULE | Freq: Every day | ORAL | 3 refills | Status: DC
Start: 2021-11-28 — End: 2022-08-06

## 2021-11-28 NOTE — Telephone Encounter (Signed)
Lets try the patient on Linzess 145 mcg daily I do not think she needs to follow a liquid only diet A lower residue diet may help but there is no definitive data that this is necessary. I do not have any specific recommendations or knowledge as to how to prevent a bowel obstruction due to her large hernia. I do not think probiotic is necessary. Please let her know my thoughts as well as the new medication recommendation She should try Linzess daily about 30 minutes before first meal.  It should produce regular bowel movements but if she is having urgency or diarrhea or if it is not effective she should let us know

## 2022-01-09 ENCOUNTER — Other Ambulatory Visit (HOSPITAL_COMMUNITY): Payer: Self-pay

## 2022-01-10 ENCOUNTER — Other Ambulatory Visit (HOSPITAL_COMMUNITY): Payer: Self-pay

## 2022-01-14 ENCOUNTER — Other Ambulatory Visit (HOSPITAL_COMMUNITY): Payer: Self-pay

## 2022-01-14 ENCOUNTER — Telehealth: Payer: Self-pay

## 2022-01-14 NOTE — Telephone Encounter (Signed)
Received a PA request from patient pharmacy. The insurance card we have on file expired on 11-29-2021. Please contact patient to see if she has new insurance, and if so, to provide a copy.

## 2022-01-14 NOTE — Telephone Encounter (Signed)
Updated insurance information is as follows:  As of 11/30/21,  [employer] ID: [id number] Group: [id number]  RxBIN: [id number] RxPCN: [id number] RxID: Lynne Leader

## 2022-01-14 NOTE — Telephone Encounter (Signed)
I have left a message for patient to call back with updated insurance information.

## 2022-01-17 ENCOUNTER — Other Ambulatory Visit (HOSPITAL_COMMUNITY): Payer: Self-pay

## 2022-01-21 ENCOUNTER — Other Ambulatory Visit (HOSPITAL_COMMUNITY): Payer: Self-pay

## 2022-01-21 NOTE — Telephone Encounter (Signed)
Patient Advocate Encounter   Received notification that prior authorization is required for Linzess 145MCG capsules  Submitted: 01-21-2022 Key [id number]   Status is pending

## 2022-01-22 ENCOUNTER — Other Ambulatory Visit (HOSPITAL_COMMUNITY): Payer: Self-pay

## 2022-01-22 NOTE — Telephone Encounter (Signed)
Received a fax from regarding Prior Authorization for Linzess 145MCG capsules.   Authorization has been DENIED due to The request for coverage for [facility] CAP 145MCG, use as directed (30 per month), is denied. This decision is based on health plan criteria for [facility] CAP 145MCG. This medicine is covered only if: One of the following: (1) You have a diagnosis of chronic idiopathic constipation. (2) You have a diagnosis of irritable bowel syndrome with constipation. (3) You have a diagnosis of functional constipation. The information provided does not show that you meet the criteria listed above.Marland Kitchen'

## 2022-01-22 NOTE — Telephone Encounter (Signed)
Based on her symptoms as she described, chronic idiopathic constipation is a reasonable diagnosis and can be added to this prescription request

## 2022-01-23 ENCOUNTER — Other Ambulatory Visit (HOSPITAL_COMMUNITY): Payer: Self-pay

## 2022-01-23 NOTE — Telephone Encounter (Signed)
Pharmacy Patient Advocate Encounter  Prior Authorization for Linzess 145 mcg has been approved.    PA# [id number] Effective dates: 01/23/2022 through 01/24/2023   Spoke with Pharmacy to process.  Joneen Boers, [place] Patient Advocate Specialist [place] Patient Advocate Team Direct Number: [phone] Fax: [phone]

## 2022-01-23 NOTE — Telephone Encounter (Signed)
Pharmacy Patient Advocate Encounter   Received notification from [employer] that prior authorization for Linzess 13mg is required/requested.   PA submitted on 01/23/2022 Key [id number] Status is pending  AJoneen Boers C[place] AltoPatient Advocate Specialist [place] Advocate Team Direct Number: [phone]: [phone]

## 2022-06-18 DIAGNOSIS — Z0289 Encounter for other administrative examinations: Secondary | ICD-10-CM

## 2022-06-19 ENCOUNTER — Ambulatory Visit (INDEPENDENT_AMBULATORY_CARE_PROVIDER_SITE_OTHER): Payer: 59 | Admitting: Podiatry

## 2022-06-19 DIAGNOSIS — B351 Tinea unguium: Secondary | ICD-10-CM

## 2022-06-19 DIAGNOSIS — L600 Ingrowing nail: Secondary | ICD-10-CM

## 2022-06-19 NOTE — Progress Notes (Signed)
Subjective:   Patient ID: Chelsea Soto, female   DOB: 57 y.o.   MRN: [id number]   HPI Patient states she has had chronic ingrown toenails of both big toes on the lateral borders and also is concerned about thickness of her nails and whether or not it could be treated.  States that she has tried trimming she has tried other modalities without relief and it is bothersome and does not smoke likes to be active   Review of Systems  All other systems reviewed and are negative.       Objective:  Physical Exam Vitals and nursing note reviewed.  Constitutional:      Appearance: She is well-developed.  Pulmonary:     Effort: Pulmonary effort is normal.  Musculoskeletal:        General: Normal range of motion.  Skin:    General: Skin is warm.  Neurological:     Mental Status: She is alert.     Neurovascular status intact muscle strength adequate range of motion adequate with incurvated lateral border hallux bilateral and thickness of the nailbeds in general that has occurred with moderate obesity is complicating factor.  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  Gave instructions on soaks reappoint to recheck all questions answered wear dressings 24 hours take them off earlier if throbbing were to occur and reappoint 4 weeks to review fungal cultures

## 2022-06-19 NOTE — Patient Instructions (Signed)

## 2022-07-17 ENCOUNTER — Other Ambulatory Visit: Payer: Self-pay

## 2022-07-17 DIAGNOSIS — B351 Tinea unguium: Secondary | ICD-10-CM

## 2022-07-23 ENCOUNTER — Encounter (INDEPENDENT_AMBULATORY_CARE_PROVIDER_SITE_OTHER): Payer: Self-pay

## 2022-07-23 ENCOUNTER — Ambulatory Visit (INDEPENDENT_AMBULATORY_CARE_PROVIDER_SITE_OTHER): Payer: 59 | Admitting: Internal Medicine

## 2022-07-26 ENCOUNTER — Ambulatory Visit: Payer: 59 | Admitting: Podiatry

## 2022-07-31 ENCOUNTER — Other Ambulatory Visit (HOSPITAL_COMMUNITY): Payer: Self-pay

## 2022-07-31 ENCOUNTER — Encounter (HOSPITAL_BASED_OUTPATIENT_CLINIC_OR_DEPARTMENT_OTHER): Payer: Self-pay

## 2022-07-31 ENCOUNTER — Other Ambulatory Visit (HOSPITAL_BASED_OUTPATIENT_CLINIC_OR_DEPARTMENT_OTHER): Payer: Self-pay

## 2022-07-31 MED ORDER — WEGOVY 0.25 MG/0.5ML ~~LOC~~ SOAJ
0.2500 mg | SUBCUTANEOUS | 0 refills | Status: DC
  Filled 2022-07-31 (×2): qty 2, 28d supply, fill #0

## 2022-07-31 MED ORDER — LINZESS 290 MCG PO CAPS
290.0000 ug | ORAL_CAPSULE | Freq: Every day | ORAL | 0 refills | Status: DC
  Filled 2022-07-31: qty 30, 30d supply, fill #0
  Filled 2022-10-14: qty 30, 30d supply, fill #1
  Filled 2022-11-18: qty 30, 30d supply, fill #2

## 2022-08-01 ENCOUNTER — Other Ambulatory Visit (HOSPITAL_BASED_OUTPATIENT_CLINIC_OR_DEPARTMENT_OTHER): Payer: Self-pay

## 2022-08-01 ENCOUNTER — Ambulatory Visit (INDEPENDENT_AMBULATORY_CARE_PROVIDER_SITE_OTHER): Payer: 59 | Admitting: Podiatry

## 2022-08-01 DIAGNOSIS — M79674 Pain in right toe(s): Secondary | ICD-10-CM

## 2022-08-01 DIAGNOSIS — B351 Tinea unguium: Secondary | ICD-10-CM | POA: Diagnosis not present

## 2022-08-01 DIAGNOSIS — M79675 Pain in left toe(s): Secondary | ICD-10-CM | POA: Diagnosis not present

## 2022-08-01 NOTE — Progress Notes (Signed)
Subjective:   Patient ID: Chelsea Soto, female   DOB: 57 y.o.   MRN: [id number]   HPI Patient states so far satisfied with ingrown toenails removed but does have thick yellow brittle nailbeds 1-5 both feet that are bothersome for her and she needs to have them trimmed   ROS      Objective:  Physical Exam  Neurovascular status intact good healing of ingrown toenail deformity hallux bilateral with thick yellow brittle nail disease 1-5 both feet     Assessment:  Chronic mycotic nail infection with pain 1-5 both feet with healed ingrown toenails     Plan:  H&P debridement accomplished recommended continuation of conservative care and also can get pedicures.  Patient will be seen back as needed may require nail removal in future

## 2022-08-06 ENCOUNTER — Ambulatory Visit (INDEPENDENT_AMBULATORY_CARE_PROVIDER_SITE_OTHER): Payer: 59 | Admitting: Internal Medicine

## 2022-08-06 ENCOUNTER — Encounter (INDEPENDENT_AMBULATORY_CARE_PROVIDER_SITE_OTHER): Payer: Self-pay | Admitting: Internal Medicine

## 2022-08-06 VITALS — BP 115/74 | HR 58 | Temp 98.2°F | Ht 63.0 in | Wt 312.0 lb

## 2022-08-06 DIAGNOSIS — R0602 Shortness of breath: Secondary | ICD-10-CM

## 2022-08-06 DIAGNOSIS — I2510 Atherosclerotic heart disease of native coronary artery without angina pectoris: Secondary | ICD-10-CM

## 2022-08-06 DIAGNOSIS — I10 Essential (primary) hypertension: Secondary | ICD-10-CM | POA: Insufficient documentation

## 2022-08-06 DIAGNOSIS — Z1331 Encounter for screening for depression: Secondary | ICD-10-CM | POA: Insufficient documentation

## 2022-08-06 DIAGNOSIS — F32A Depression, unspecified: Secondary | ICD-10-CM | POA: Diagnosis not present

## 2022-08-06 DIAGNOSIS — R7303 Prediabetes: Secondary | ICD-10-CM

## 2022-08-06 DIAGNOSIS — Z6841 Body Mass Index (BMI) 40.0 and over, adult: Secondary | ICD-10-CM

## 2022-08-06 DIAGNOSIS — E782 Mixed hyperlipidemia: Secondary | ICD-10-CM | POA: Insufficient documentation

## 2022-08-06 DIAGNOSIS — R5383 Other fatigue: Secondary | ICD-10-CM

## 2022-08-06 NOTE — Assessment & Plan Note (Signed)
I reviewed recent imaging studies.  She had a CT scan that showed aortic atherosclerosis and coronary artery calcifications.  She is currently on rosuvastatin 20 mg a day which I think is adequate as she is considered increased cardiovascular risk.

## 2022-08-06 NOTE — Assessment & Plan Note (Signed)
Most recent A1c is  Lab Results  Component Value Date   HGBA1C 6.2 (H) 06/27/2020  . Patient informed of disease state and risk of progression. This may contribute to abnormal cravings, fatigue and diabetes complications without having diabetes.   We reviewed treatment options which include losing 7 to 10% of body weight, increasing physical activity to a 150 minutes a week of moderate intensity.She may also be a candidate for pharmacoprophylaxis with metformin or incretin mimetic.

## 2022-08-06 NOTE — Assessment & Plan Note (Signed)
Blood pressure at goal for age and risk category.  On lisinopril hydrochlorothiazide 1 tablet daily without adverse effects.  Most recent renal parameters reviewed which showed normal electrolytes and kidney function.  Continue with weight loss therapy.  Monitor for symptoms of orthostasis while losing weight. Continue current regimen and home monitoring for a goal blood pressure of 120/80.

## 2022-08-06 NOTE — Progress Notes (Unsigned)
Chief Complaint:   OBESITY Chelsea Soto (MR# [id number]) is a 57 y.o. female who presents for evaluation and treatment of obesity and related comorbidities. Current BMI is Body mass index is 55.27 kg/m. Chelsea Soto has been struggling with her weight for many years and has been unsuccessful in either losing weight, maintaining weight loss, or reaching her healthy weight goal.  Chelsea Soto is currently in the action stage of change and ready to dedicate time achieving and maintaining a healthier weight. Chelsea Soto is interested in becoming our patient and working on intensive lifestyle modifications including (but not limited to) diet and exercise for weight loss.  Chelsea Soto's habits were reviewed today and are as follows: Her family eats meals together, she thinks her family will eat healthier with her, her desired weight loss is 162 lbs, she has been heavy most of her life, she started gaining weight in 2015, her heaviest weight ever was 345 pounds, she has significant food cravings issues, she snacks frequently in the evenings, she is frequently drinking liquids with calories, she frequently makes poor food choices, she frequently eats larger portions than normal, and she struggles with emotional eating.  Depression Screen [name]'s Food and Mood (modified PHQ-9) score was 19.  Subjective:   1. Other fatigue Chelsea Soto admits to daytime somnolence and denies waking up still tired. Patient has a history of symptoms of daytime fatigue and morning fatigue. Chelsea Soto generally gets 6 hours of sleep per night, and states that she has nightime awakenings and generally restful sleep. Snoring is not present. Apneic episodes are not present. Epworth Sleepiness Score is 12.   2. SOB (shortness of breath) on exertion Chelsea Soto notes increasing shortness of breath with exercising and seems to be worsening over time with weight gain. She notes getting out of breath sooner with activity than she used to. This has  not gotten worse recently. Chelsea Soto denies shortness of breath at rest or orthopnea.  3. Prediabetes Patient informed of disease state and risk of progression. This may contribute to abnormal cravings, fatigue and diabetes complications without having diabetes.   Most recent A1c is  Lab Results  Component Value Date   HGBA1C 6.2 (H) 06/27/2020   4. Primary hypertension Blood pressure at goal for age and risk category.  On lisinopril hydrochlorothiazide 1 tablet daily without adverse effects.  Most recent renal parameters reviewed which showed normal electrolytes and kidney function.  5. Mixed hyperlipidemia LDL is not at goal. Elevated LDL may be secondary to nutrition, genetics and spillover effect from excess adiposity. Recommended LDL goal is <70 to reduce the risk of fatty streaks and the progression to obstructive ASCVD in the future. Her calculated 10 year risk is: The 10-year ASCVD risk score (Arnett DK, et al., 2019) is: 3% but considering the presence of coronary artery calcifications her risk is higher.  She is currently on high-dose statin therapy without any adverse effects.  Lab Results  Component Value Date   CHOL 148 06/27/2020   HDL 38 (L) 06/27/2020   LDLCALC 85 06/27/2020   TRIG 141 06/27/2020   CHOLHDL 3.9 06/27/2020   6. Coronary artery calcification seen on CAT scan She is currently on rosuvastatin 20 mg a day which I think is adequate as she is considered increased cardiovascular risk.  Assessment/Plan:   1. Other fatigue Chelsea Soto does feel that her weight is causing her energy to be lower than it should be. Fatigue may be related to obesity, depression or many other causes. Labs  will be ordered, and in the meanwhile, Chelsea Soto will focus on self care including making healthy food choices, increasing physical activity and focusing on stress reduction.  - EKG 12-Lead - Vitamin B12  2. SOB (shortness of breath) on exertion [name] does feel that she gets out of  breath more easily that she used to when she exercises. Chelsea Soto's shortness of breath appears to be obesity related and exercise induced. She has agreed to work on weight loss and gradually increase exercise to treat her exercise induced shortness of breath. Will continue to monitor closely.  3. Prediabetes We reviewed treatment options which include losing 7 to 10% of body weight, increasing physical activity to a 150 minutes a week of moderate intensity.She may also be a candidate for pharmacoprophylaxis with metformin or incretin mimetic.   - Comprehensive metabolic panel - Insulin, random  4. Primary hypertension Continue with weight loss therapy.  Monitor for symptoms of orthostasis while losing weight. Continue current regimen and home monitoring for a goal blood pressure of 120/80.  5. Mixed hyperlipidemia Continue weight loss therapy, losing 10% or more of body weight may improve condition. Also advised to reduce saturated fats in diet to less than 10% of daily calories.  She will continue on high-dose statin therapy for cardiovascular disease prevention.  - Lipid Panel With LDL/HDL Ratio  6. Coronary artery calcification seen on CAT scan I reviewed recent imaging studies.  She had a CT scan that showed aortic atherosclerosis and coronary artery calcifications.    7. Depression screen Chelsea Soto had a positive depression screening. Depression is commonly associated with obesity and often results in emotional eating behaviors. We will monitor this closely and work on CBT to help improve the non-hunger eating patterns. Referral to Psychology may be required if no improvement is seen as she continues in our clinic.  8. Class 3 severe obesity with serious comorbidity and body mass index (BMI) of 50.0 to 59.9 in adult, unspecified obesity type (HCC) - VITAMIN D 25 Hydroxy (Vit-D Deficiency, Fractures)  Chelsea Soto is currently in the action stage of change and her goal is to continue with weight  loss efforts. I recommend Birttany begin the structured treatment plan as follows:  She has agreed to the Category 3 Plan.  Exercise goals: No exercise has been prescribed at this time.   Behavioral modification strategies: increasing lean protein intake, decreasing simple carbohydrates, increasing vegetables, increasing water intake, decreasing liquid calories, increasing high fiber foods, decreasing eating out, meal planning and cooking strategies, keeping healthy foods in the home, better snacking choices, and planning for success.  She was informed of the importance of frequent follow-up visits to maximize her success with intensive lifestyle modifications for her multiple health conditions. She was informed we would discuss her lab results at her next visit unless there is a critical issue that needs to be addressed sooner. Katalyn agreed to keep her next visit at the agreed upon time to discuss these results.  Objective:   Blood pressure 115/74, pulse (!) 58, temperature 98.2 F (36.8 C), height 5\' 3"  (1.6 m), weight (!) 312 lb (141.5 kg), SpO2 98 %. Body mass index is 55.27 kg/m.  EKG: Normal sinus rhythm, rate 58 BPM.  Indirect Calorimeter completed today shows a VO2 of 332 and a REE of 2290.  Her calculated basal metabolic rate is 2956 thus her basal metabolic rate is better than expected.  General: Cooperative, alert, well developed, in no acute distress. HEENT: Conjunctivae and lids unremarkable. Cardiovascular: Regular  rhythm.  Lungs: Normal work of breathing. Neurologic: No focal deficits.   Lab Results  Component Value Date   CREATININE 0.52 08/02/2021   BUN 17 08/02/2021   NA 138 08/02/2021   K 4.0 08/02/2021   CL 102 08/02/2021   CO2 25 08/02/2021   Lab Results  Component Value Date   ALT 11 08/02/2021   AST 11 (L) 08/02/2021   ALKPHOS 83 08/02/2021   BILITOT 0.5 08/02/2021   Lab Results  Component Value Date   HGBA1C 6.2 (H) 06/27/2020   Lab Results   Component Value Date   INSULIN 19.4 06/27/2020   Lab Results  Component Value Date   TSH 3.380 06/27/2020   Lab Results  Component Value Date   CHOL 148 06/27/2020   HDL 38 (L) 06/27/2020   LDLCALC 85 06/27/2020   TRIG 141 06/27/2020   CHOLHDL 3.9 06/27/2020   Lab Results  Component Value Date   WBC 8.7 08/02/2021   HGB 12.6 08/02/2021   HCT 39.9 08/02/2021   MCV 85.6 08/02/2021   PLT 284 08/02/2021   No results found for: "IRON", "TIBC", "FERRITIN"  Attestation Statements:   Reviewed by clinician on day of visit: allergies, medications, problem list, medical history, surgical history, family history, social history, and previous encounter notes.  Time spent on visit including pre-visit chart review and post-visit charting and care was 40 minutes.   Trude Mcburney, am acting as transcriptionist for Worthy Rancher, MD.  I have reviewed the above documentation for accuracy and completeness, and I agree with the above. -Worthy Rancher, MD

## 2022-08-06 NOTE — Assessment & Plan Note (Signed)
LDL is not at goal. Elevated LDL may be secondary to nutrition, genetics and spillover effect from excess adiposity. Recommended LDL goal is <70 to reduce the risk of fatty streaks and the progression to obstructive ASCVD in the future. Her calculated 10 year risk is: The 10-year ASCVD risk score (Arnett DK, et al., 2019) is: 3% but considering the presence of coronary artery calcifications her risk is higher.  She is currently on high-dose statin therapy without any adverse effects.  Lab Results  Component Value Date   CHOL 148 06/27/2020   HDL 38 (L) 06/27/2020   LDLCALC 85 06/27/2020   TRIG 141 06/27/2020   CHOLHDL 3.9 06/27/2020    Continue weight loss therapy, losing 10% or more of body weight may improve condition. Also advised to reduce saturated fats in diet to less than 10% of daily calories.  She will continue on high-dose statin therapy for cardiovascular disease prevention.

## 2022-08-07 LAB — COMPREHENSIVE METABOLIC PANEL
ALT: 19 IU/L (ref 0–32)
AST: 15 IU/L (ref 0–40)
Albumin/Globulin Ratio: 1.6 (ref 1.2–2.2)
Albumin: 4.6 g/dL (ref 3.8–4.9)
Alkaline Phosphatase: 120 IU/L (ref 44–121)
BUN/Creatinine Ratio: 24 — ABNORMAL HIGH (ref 9–23)
BUN: 13 mg/dL (ref 6–24)
Bilirubin Total: 0.5 mg/dL (ref 0.0–1.2)
CO2: 22 mmol/L (ref 20–29)
Calcium: 10 mg/dL (ref 8.7–10.2)
Chloride: 99 mmol/L (ref 96–106)
Creatinine, Ser: 0.54 mg/dL — ABNORMAL LOW (ref 0.57–1.00)
Globulin, Total: 2.8 g/dL (ref 1.5–4.5)
Glucose: 109 mg/dL — ABNORMAL HIGH (ref 70–99)
Potassium: 4.3 mmol/L (ref 3.5–5.2)
Sodium: 140 mmol/L (ref 134–144)
Total Protein: 7.4 g/dL (ref 6.0–8.5)
eGFR: 107 mL/min/{1.73_m2} (ref >59)

## 2022-08-07 LAB — INSULIN, RANDOM: INSULIN: 24.1 u[IU]/mL (ref 2.6–24.9)

## 2022-08-07 LAB — VITAMIN B12: Vitamin B-12: 678 pg/mL (ref 232–1245)

## 2022-08-07 LAB — LIPID PANEL WITH LDL/HDL RATIO
Cholesterol, Total: 176 mg/dL (ref 100–199)
HDL: 41 mg/dL (ref >39)
LDL Chol Calc (NIH): 101 mg/dL — ABNORMAL HIGH (ref 0–99)
LDL/HDL Ratio: 2.5 ratio (ref 0.0–3.2)
Triglycerides: 194 mg/dL — ABNORMAL HIGH (ref 0–149)
VLDL Cholesterol Cal: 34 mg/dL (ref 5–40)

## 2022-08-07 LAB — VITAMIN D 25 HYDROXY (VIT D DEFICIENCY, FRACTURES): Vit D, 25-Hydroxy: 43.5 ng/mL (ref 30.0–100.0)

## 2022-08-20 ENCOUNTER — Ambulatory Visit (INDEPENDENT_AMBULATORY_CARE_PROVIDER_SITE_OTHER): Payer: 59 | Admitting: Internal Medicine

## 2022-09-17 ENCOUNTER — Other Ambulatory Visit (HOSPITAL_BASED_OUTPATIENT_CLINIC_OR_DEPARTMENT_OTHER): Payer: Self-pay

## 2022-09-17 ENCOUNTER — Encounter (INDEPENDENT_AMBULATORY_CARE_PROVIDER_SITE_OTHER): Payer: Self-pay | Admitting: Internal Medicine

## 2022-09-17 ENCOUNTER — Ambulatory Visit (INDEPENDENT_AMBULATORY_CARE_PROVIDER_SITE_OTHER): Payer: 59 | Admitting: Internal Medicine

## 2022-09-17 VITALS — BP 118/74 | HR 67 | Temp 97.9°F | Ht 63.0 in | Wt 315.0 lb

## 2022-09-17 DIAGNOSIS — R7303 Prediabetes: Secondary | ICD-10-CM | POA: Diagnosis not present

## 2022-09-17 DIAGNOSIS — I10 Essential (primary) hypertension: Secondary | ICD-10-CM | POA: Diagnosis not present

## 2022-09-17 DIAGNOSIS — I2510 Atherosclerotic heart disease of native coronary artery without angina pectoris: Secondary | ICD-10-CM

## 2022-09-17 DIAGNOSIS — E782 Mixed hyperlipidemia: Secondary | ICD-10-CM | POA: Diagnosis not present

## 2022-09-17 DIAGNOSIS — Z6841 Body Mass Index (BMI) 40.0 and over, adult: Secondary | ICD-10-CM

## 2022-09-17 MED ORDER — SEMAGLUTIDE-WEIGHT MANAGEMENT 0.5 MG/0.5ML ~~LOC~~ SOAJ
0.5000 mg | SUBCUTANEOUS | 0 refills | Status: DC
  Filled 2022-09-17: qty 2, 28d supply, fill #0

## 2022-09-17 NOTE — Progress Notes (Unsigned)
Office: [phone]  /  Fax: [phone]  WEIGHT SUMMARY AND BIOMETRICS  Vitals Temp: 97.9 F (36.6 C) BP: 118/74 Pulse Rate: 67 SpO2: 97 %   Anthropometric Measurements Height: 5\' 3"  (1.6 m) Weight: (!) 315 lb (142.9 kg) BMI (Calculated): 55.81 Weight at Last Visit: 312 lb Weight Gained Since Last Visit: 3 lb Starting Weight: 312 lb Peak Weight: 343 lb   Body Composition  Body Fat %: 56.1 % Fat Mass (lbs): 177 lbs Muscle Mass (lbs): 131.6 lbs Total Body Water (lbs): 99.6 lbs Visceral Fat Rating : 24    No data recorded Today's Visit #: 2  Starting Date: 08/06/22  The 10-year ASCVD risk score (Arnett DK, et al., 2019) is: 3.8%  HPI  Chief Complaint: OBESITY  Enisa is here to discuss her progress with her obesity treatment plan. She is on the the Category 3 Plan and states she is following her eating plan approximately 10 % of the time. She states she is not exercising.  Interval History:  Since last office visit she has gained 30 pounds.  She has had some family circumstances which required frequent traveling to the hospital and therefore has not been able to implement plan. She reports has not implemented reduced calorie nutritional plan She has been working on working on gradual implementation of reduced calorie nutrition plan and thinking of starting to exercise.  She is requesting that we take over management of her GLP-1 regimen.  Orixegenic Control: Reports problems with appetite and hunger signals.  Denies problems with satiety and satiation.  Denies problems with eating patterns and portion control.  Denies abnormal cravings. Denies feeling deprived or restricted.   Barriers identified: lack of time for self-care, strong hunger signals and appetite, physical inactivity, multiple competing priorities, and work schedule.   Pharmacotherapy for weight loss: She is currently taking Wegovy without clinical response, without side effects., and on  starter dose .  Had been on Wegovy 2.4 in the past with clinical response.   ASSESSMENT AND PLAN  TREATMENT PLAN FOR OBESITY:  Recommended Dietary Goals  Darienne is currently in the action stage of change. As such, her goal is to continue weight management plan. She has agreed to: continue current plan  Behavioral Intervention  We discussed the following Behavioral Modification Strategies today: increasing lean protein intake, decreasing simple carbohydrates , increasing vegetables, increasing lower glycemic fruits, increasing fiber rich foods, avoiding skipping meals, increasing water intake, reading food labels , decreasing eating out or consumption of processed foods, and making healthy choices when eating convenient foods, avoiding temptations and identifying enticing environmental cues, and planning for success.  Additional resources provided today: Handout on increasing daily activity and exercise goal setting  Recommended Physical Activity Goals  Camiyah has been advised to work up to 150 minutes of moderate intensity aerobic activity a week and strengthening exercises 2-3 times per week for cardiovascular health, weight loss maintenance and preservation of muscle mass.   She has agreed to :  Think about ways to increase daily physical activity and overcoming barriers to exercise and Increase physical activity in their day and reduce sedentary time (increase NEAT).  Pharmacotherapy We discussed various medication options to help Mckinlie with her weight loss efforts and we both agreed to : increase semaglutide to 0.5 mg once a week  ASSOCIATED CONDITIONS ADDRESSED TODAY  Class 3 severe obesity with serious comorbidity and body mass index (BMI) of 50.0 to 59.9 in adult, unspecified obesity type (HCC) -  Semaglutide-Weight Management; Inject 0.5 mg into the skin once a week for 28 days.  Dispense: 2 mL; Refill: 0  Prediabetes Assessment & Plan: Most recent A1c is  Lab  Results  Component Value Date   HGBA1C 6.2 (H) 06/27/2020  . Patient informed of disease state and risk of progression. This may contribute to abnormal cravings, fatigue and diabetes complications without having diabetes.   We reviewed treatment options which include losing 7 to 10% of body weight, increasing physical activity to a 150 minutes a week of moderate intensity.  Increase semaglutide to 0.5 mg once a week for pharmacoprophylaxis and weight management.    Primary hypertension Assessment & Plan: Blood pressure at goal for age and risk category.  On lisinopril hydrochlorothiazide 1 tablet daily without adverse effects.  Most recent renal parameters reviewed which showed normal electrolytes and kidney function.  Continue with weight loss therapy.  Monitor for symptoms of orthostasis while losing weight. Continue current regimen and home monitoring for a goal blood pressure of 120/80.    Mixed hyperlipidemia Assessment & Plan: LDL is not at goal.  She is on high intensity statin therapy.  She has aortic atherosclerosis and coronary artery calcifications so her lifetime risk is elevated.  Elevated LDL may be secondary to nutrition, genetics and spillover effect from excess adiposity. Recommended LDL goal is <70 to reduce the risk of fatty streaks and the progression to obstructive ASCVD in the future. Her 10 year risk is: The 10-year ASCVD risk score (Arnett DK, et al., 2019) is: 3.8% she may benefit from further intensification of statin therapy but I do not recommend doing that just yet because of her muscle pain which may or may not be related to statin.  Lab Results  Component Value Date   CHOL 176 08/06/2022   HDL 41 08/06/2022   LDLCALC 101 (H) 08/06/2022   TRIG 194 (H) 08/06/2022   CHOLHDL 3.9 06/27/2020    Continue weight loss therapy, losing 10% or more of body weight may improve condition. Also advised to reduce saturated fats in diet to less than 10% of daily calories.         Coronary artery calcification seen on CAT scan Assessment & Plan: I reviewed recent imaging studies.  She had a CT scan that showed aortic atherosclerosis and coronary artery calcifications.  She is currently on rosuvastatin 20 mg a day which I think is adequate as she is considered increased cardiovascular risk. The 10-year ASCVD risk score (Arnett DK, et al., 2019) is: 3.8%  She is experiencing some muscular pain sometimes at night she is not sure if it is related to statin or GLP-1.  I advised patient to hold rosuvastatin for 2 weeks to see if symptoms resolve and to start taking it again to determine cause-and-effect relationship.  She was also advised to increase fluid intake as well as physical activity.  Reginal Lutes has not been reported to cause myopathy.      PHYSICAL EXAM:  Blood pressure 118/74, pulse 67, temperature 97.9 F (36.6 C), height 5\' 3"  (1.6 m), weight (!) 315 lb (142.9 kg), SpO2 97 %. Body mass index is 55.8 kg/m.  General: She is overweight, cooperative, alert, well developed, and in no acute distress. PSYCH: Has normal mood, affect and thought process.   HEENT: EOMI, sclerae are anicteric. Lungs: Normal breathing effort, no conversational dyspnea. Extremities: No edema.  Neurologic: No gross sensory or motor deficits. No tremors or fasciculations noted.    DIAGNOSTIC DATA REVIEWED:  BMET    Component Value Date/Time   NA 140 08/06/2022 1224   K 4.3 08/06/2022 1224   CL 99 08/06/2022 1224   CO2 22 08/06/2022 1224   GLUCOSE 109 (H) 08/06/2022 1224   GLUCOSE 107 (H) 08/02/2021 0719   BUN 13 08/06/2022 1224   CREATININE 0.54 (L) 08/06/2022 1224   CALCIUM 10.0 08/06/2022 1224   GFRNONAA >60 08/02/2021 0719   GFRAA >60 01/18/2019 1014   Lab Results  Component Value Date   HGBA1C 6.2 (H) 06/27/2020   Lab Results  Component Value Date   INSULIN 24.1 08/06/2022   INSULIN 19.4 06/27/2020   Lab Results  Component Value Date   TSH 3.380  06/27/2020   CBC    Component Value Date/Time   WBC 8.7 08/02/2021 0719   RBC 4.66 08/02/2021 0719   HGB 12.6 08/02/2021 0719   HGB 13.5 06/27/2020 1020   HCT 39.9 08/02/2021 0719   HCT 41.1 06/27/2020 1020   PLT 284 08/02/2021 0719   PLT 337 06/27/2020 1020   MCV 85.6 08/02/2021 0719   MCV 85 06/27/2020 1020   MCH 27.0 08/02/2021 0719   MCHC 31.6 08/02/2021 0719   RDW 14.3 08/02/2021 0719   RDW 13.8 06/27/2020 1020   Iron Studies No results found for: "IRON", "TIBC", "FERRITIN", "IRONPCTSAT" Lipid Panel     Component Value Date/Time   CHOL 176 08/06/2022 1224   TRIG 194 (H) 08/06/2022 1224   HDL 41 08/06/2022 1224   CHOLHDL 3.9 06/27/2020 1020   LDLCALC 101 (H) 08/06/2022 1224   Hepatic Function Panel     Component Value Date/Time   PROT 7.4 08/06/2022 1224   ALBUMIN 4.6 08/06/2022 1224   AST 15 08/06/2022 1224   ALT 19 08/06/2022 1224   ALKPHOS 120 08/06/2022 1224   BILITOT 0.5 08/06/2022 1224   BILIDIR <0.1 09/12/2020 1010   IBILI NOT CALCULATED 09/12/2020 1010      Component Value Date/Time   TSH 3.380 06/27/2020 1020   Nutritional Lab Results  Component Value Date   VD25OH 43.5 08/06/2022   VD25OH 53.3 06/27/2020     Return in about 2 weeks (around 10/01/2022) for For Weight Mangement with Dr. Rikki Spearing.Marland Kitchen She was informed of the importance of frequent follow up visits to maximize her success with intensive lifestyle modifications for her multiple health conditions.   ATTESTASTION STATEMENTS:  Reviewed by clinician on day of visit: allergies, medications, problem list, medical history, surgical history, family history, social history, and previous encounter notes.     Worthy Rancher, MD

## 2022-09-18 NOTE — Assessment & Plan Note (Signed)
LDL is not at goal.  She is on high intensity statin therapy.  She has aortic atherosclerosis and coronary artery calcifications so her lifetime risk is elevated.  Elevated LDL may be secondary to nutrition, genetics and spillover effect from excess adiposity. Recommended LDL goal is <70 to reduce the risk of fatty streaks and the progression to obstructive ASCVD in the future. Her 10 year risk is: The 10-year ASCVD risk score (Arnett DK, et al., 2019) is: 3.8% she may benefit from further intensification of statin therapy but I do not recommend doing that just yet because of her muscle pain which may or may not be related to statin.  Lab Results  Component Value Date   CHOL 176 08/06/2022   HDL 41 08/06/2022   LDLCALC 101 (H) 08/06/2022   TRIG 194 (H) 08/06/2022   CHOLHDL 3.9 06/27/2020    Continue weight loss therapy, losing 10% or more of body weight may improve condition. Also advised to reduce saturated fats in diet to less than 10% of daily calories.

## 2022-09-18 NOTE — Assessment & Plan Note (Signed)
Most recent A1c is  Lab Results  Component Value Date   HGBA1C 6.2 (H) 06/27/2020  . Patient informed of disease state and risk of progression. This may contribute to abnormal cravings, fatigue and diabetes complications without having diabetes.   We reviewed treatment options which include losing 7 to 10% of body weight, increasing physical activity to a 150 minutes a week of moderate intensity.  Increase semaglutide to 0.5 mg once a week for pharmacoprophylaxis and weight management.

## 2022-09-18 NOTE — Assessment & Plan Note (Signed)
I reviewed recent imaging studies.  She had a CT scan that showed aortic atherosclerosis and coronary artery calcifications.  She is currently on rosuvastatin 20 mg a day which I think is adequate as she is considered increased cardiovascular risk. The 10-year ASCVD risk score (Arnett DK, et al., 2019) is: 3.8%  She is experiencing some muscular pain sometimes at night she is not sure if it is related to statin or GLP-1.  I advised patient to hold rosuvastatin for 2 weeks to see if symptoms resolve and to start taking it again to determine cause-and-effect relationship.  She was also advised to increase fluid intake as well as physical activity.  Reginal Lutes has not been reported to cause myopathy.

## 2022-09-18 NOTE — Assessment & Plan Note (Signed)
Blood pressure at goal for age and risk category.  On lisinopril hydrochlorothiazide 1 tablet daily without adverse effects.  Most recent renal parameters reviewed which showed normal electrolytes and kidney function.  Continue with weight loss therapy.  Monitor for symptoms of orthostasis while losing weight. Continue current regimen and home monitoring for a goal blood pressure of 120/80.  

## 2022-09-20 ENCOUNTER — Other Ambulatory Visit (HOSPITAL_BASED_OUTPATIENT_CLINIC_OR_DEPARTMENT_OTHER): Payer: Self-pay

## 2022-10-10 ENCOUNTER — Encounter (INDEPENDENT_AMBULATORY_CARE_PROVIDER_SITE_OTHER): Payer: Self-pay | Admitting: Internal Medicine

## 2022-10-10 ENCOUNTER — Other Ambulatory Visit (HOSPITAL_BASED_OUTPATIENT_CLINIC_OR_DEPARTMENT_OTHER): Payer: Self-pay

## 2022-10-10 ENCOUNTER — Ambulatory Visit (INDEPENDENT_AMBULATORY_CARE_PROVIDER_SITE_OTHER): Payer: 59 | Admitting: Internal Medicine

## 2022-10-10 VITALS — BP 99/66 | HR 56 | Temp 97.5°F | Ht 63.0 in | Wt 316.0 lb

## 2022-10-10 DIAGNOSIS — E782 Mixed hyperlipidemia: Secondary | ICD-10-CM | POA: Diagnosis not present

## 2022-10-10 DIAGNOSIS — R7303 Prediabetes: Secondary | ICD-10-CM

## 2022-10-10 DIAGNOSIS — Z6841 Body Mass Index (BMI) 40.0 and over, adult: Secondary | ICD-10-CM

## 2022-10-10 MED ORDER — SEMAGLUTIDE-WEIGHT MANAGEMENT 1 MG/0.5ML ~~LOC~~ SOAJ
1.0000 mg | SUBCUTANEOUS | 0 refills | Status: DC
  Filled 2022-10-10: qty 2, 28d supply, fill #0

## 2022-10-10 NOTE — Assessment & Plan Note (Signed)
LDL is not at goal.  She is on high intensity statin therapy.  She has aortic atherosclerosis and coronary artery calcifications so her lifetime risk is elevated.  Elevated LDL may be secondary to nutrition, genetics and spillover effect from excess adiposity. Recommended LDL goal is <70 to reduce the risk of fatty streaks and the progression to obstructive ASCVD in the future.   Lab Results  Component Value Date   CHOL 176 08/06/2022   HDL 41 08/06/2022   LDLCALC 101 (H) 08/06/2022   TRIG 194 (H) 08/06/2022   CHOLHDL 3.9 06/27/2020   She has been experiencing muscle aches on rosuvastatin and has noticed an improvement with holding medication.  She will discuss with primary care physician about either reducing medication or switching to a different agent.  She benefits from statin therapy.

## 2022-10-10 NOTE — Assessment & Plan Note (Signed)
Most recent A1c is  Lab Results  Component Value Date   HGBA1C 6.2 (H) 06/27/2020  . Patient informed of disease state and risk of progression. This may contribute to abnormal cravings, fatigue and diabetes complications without having diabetes.   We reviewed treatment options which include losing 7 to 10% of body weight, increasing physical activity to a 150 minutes a week of moderate intensity.  We will increase semaglutide to 1 mg once a week for pharmacoprophylaxis and weight management.

## 2022-10-10 NOTE — Progress Notes (Signed)
Office: [phone]  /  Fax: [phone]  WEIGHT SUMMARY AND BIOMETRICS  Vitals Temp: (!) 97.5 F (36.4 C) BP: 99/66 Pulse Rate: (!) 56 SpO2: 97 %   Anthropometric Measurements Height: 5\' 3"  (1.6 m) Weight: (!) 316 lb (143.3 kg) BMI (Calculated): 55.99 Weight at Last Visit: 315 lb Weight Lost Since Last Visit: 0 Weight Gained Since Last Visit: 1 lb Starting Weight: 312 lb Total Weight Loss (lbs): 0 lb (0 kg) Peak Weight: 343 lb   Body Composition  Body Fat %: 56.7 % Fat Mass (lbs): 179.6 lbs Muscle Mass (lbs): 130.2 lbs Total Body Water (lbs): 100.2 lbs Visceral Fat Rating : 24    No data recorded Today's Visit #: 3  Starting Date: 08/06/22   HPI  Chief Complaint: OBESITY  Chelsea Soto is here to discuss her progress with her obesity treatment plan. She is on the the Category 3 Plan and states she is following her eating plan approximately 20 % of the time. She states she is  not exercising.  Interval History:  Since last office visit she has gained 1 pound.  She had difficulty implementing plan due to frequent traveling as she is helping care for her newborn grandchild who has several complications.  She has therefore been relying on convenience.   Barriers identified: lack of time for self-care, strong hunger signals and appetite, having difficulty with meal prep and planning, having difficulty focusing on healthy eating, exposure to enticing environments and or relationships, physical inactivity, multiple competing priorities, and difficulty implementing reduced calorie nutrition plan.   Pharmacotherapy for weight loss: She is currently taking Wegovy without clinical response and without side effects..    ASSESSMENT AND PLAN  TREATMENT PLAN FOR OBESITY:  Recommended Dietary Goals  Jennings is currently in the action stage of change. As such, her goal is to continue weight management plan. She has agreed to:  We discussed strategies to improve adherence  to reduced calorie nutrition plan.  She will work on [employer].  Behavioral Intervention  We discussed the following Behavioral Modification Strategies today: increasing lean protein intake, decreasing simple carbohydrates , increasing vegetables, increasing lower glycemic fruits, increasing water intake, decreasing eating out or consumption of processed foods, and making healthy choices when eating convenient foods, avoiding temptations and identifying enticing environmental cues, continue to work on implementation of reduced calorie nutritional plan, and planning for success.  Additional resources provided today: None  Recommended Physical Activity Goals  [name] has been advised to work up to 150 minutes of moderate intensity aerobic activity a week and strengthening exercises 2-3 times per week for cardiovascular health, weight loss maintenance and preservation of muscle mass.   She has agreed to :  Think about ways to increase daily physical activity and overcoming barriers to exercise and Increase physical activity in their day and reduce sedentary time (increase NEAT).  Pharmacotherapy We discussed various medication options to help [name] with her weight loss efforts and we both agreed to : increase Wegovy to 1 mg once a week.  ASSOCIATED CONDITIONS ADDRESSED TODAY  Mixed hyperlipidemia Assessment & Plan: LDL is not at goal.  She is on high intensity statin therapy.  She has aortic atherosclerosis and coronary artery calcifications so her lifetime risk is elevated.  Elevated LDL may be secondary to nutrition, genetics and spillover effect from excess adiposity. Recommended LDL goal is <70 to reduce the risk of fatty streaks and the progression to obstructive ASCVD in the future.   Lab Results  Component Value Date   CHOL 176 08/06/2022   HDL 41 08/06/2022   LDLCALC 101 (H) 08/06/2022   TRIG 194 (H) 08/06/2022   CHOLHDL 3.9 06/27/2020   She has been experiencing muscle  aches on rosuvastatin and has noticed an improvement with holding medication.  She will discuss with primary care physician about either reducing medication or switching to a different agent.  She benefits from statin therapy.      Class 3 severe obesity with serious comorbidity and body mass index (BMI) of 50.0 to 59.9 in adult, unspecified obesity type (HCC) -     Semaglutide-Weight Management; Inject 1 mg into the skin once a week for 28 days.  Dispense: 2 mL; Refill: 0  Prediabetes Assessment & Plan: Most recent A1c is  Lab Results  Component Value Date   HGBA1C 6.2 (H) 06/27/2020  . Patient informed of disease state and risk of progression. This may contribute to abnormal cravings, fatigue and diabetes complications without having diabetes.   We reviewed treatment options which include losing 7 to 10% of body weight, increasing physical activity to a 150 minutes a week of moderate intensity.  We will increase semaglutide to 1 mg once a week for pharmacoprophylaxis and weight management.      PHYSICAL EXAM:  Blood pressure 99/66, pulse (!) 56, temperature (!) 97.5 F (36.4 C), height 5\' 3"  (1.6 m), weight (!) 316 lb (143.3 kg), SpO2 97%. Body mass index is 55.98 kg/m.  General: She is overweight, cooperative, alert, well developed, and in no acute distress. PSYCH: Has normal mood, affect and thought process.   HEENT: EOMI, sclerae are anicteric. Lungs: Normal breathing effort, no conversational dyspnea. Extremities: No edema.  Neurologic: No gross sensory or motor deficits. No tremors or fasciculations noted.    DIAGNOSTIC DATA REVIEWED:  BMET    Component Value Date/Time   NA 140 08/06/2022 1224   K 4.3 08/06/2022 1224   CL 99 08/06/2022 1224   CO2 22 08/06/2022 1224   GLUCOSE 109 (H) 08/06/2022 1224   GLUCOSE 107 (H) 08/02/2021 0719   BUN 13 08/06/2022 1224   CREATININE 0.54 (L) 08/06/2022 1224   CALCIUM 10.0 08/06/2022 1224   GFRNONAA >60 08/02/2021 0719    GFRAA >60 01/18/2019 1014   Lab Results  Component Value Date   HGBA1C 6.2 (H) 06/27/2020   Lab Results  Component Value Date   INSULIN 24.1 08/06/2022   INSULIN 19.4 06/27/2020   Lab Results  Component Value Date   TSH 3.380 06/27/2020   CBC    Component Value Date/Time   WBC 8.7 08/02/2021 0719   RBC 4.66 08/02/2021 0719   HGB 12.6 08/02/2021 0719   HGB 13.5 06/27/2020 1020   HCT 39.9 08/02/2021 0719   HCT 41.1 06/27/2020 1020   PLT 284 08/02/2021 0719   PLT 337 06/27/2020 1020   MCV 85.6 08/02/2021 0719   MCV 85 06/27/2020 1020   MCH 27.0 08/02/2021 0719   MCHC 31.6 08/02/2021 0719   RDW 14.3 08/02/2021 0719   RDW 13.8 06/27/2020 1020   Iron Studies No results found for: "IRON", "TIBC", "FERRITIN", "IRONPCTSAT" Lipid Panel     Component Value Date/Time   CHOL 176 08/06/2022 1224   TRIG 194 (H) 08/06/2022 1224   HDL 41 08/06/2022 1224   CHOLHDL 3.9 06/27/2020 1020   LDLCALC 101 (H) 08/06/2022 1224   Hepatic Function Panel     Component Value Date/Time   PROT 7.4 08/06/2022 1224  ALBUMIN 4.6 08/06/2022 1224   AST 15 08/06/2022 1224   ALT 19 08/06/2022 1224   ALKPHOS 120 08/06/2022 1224   BILITOT 0.5 08/06/2022 1224   BILIDIR <0.1 09/12/2020 1010   IBILI NOT CALCULATED 09/12/2020 1010      Component Value Date/Time   TSH 3.380 06/27/2020 1020   Nutritional Lab Results  Component Value Date   VD25OH 43.5 08/06/2022   VD25OH 53.3 06/27/2020     Return in about 3 weeks (around 10/31/2022) for For Weight Mangement with Dr. Rikki Spearing.Marland Kitchen She was informed of the importance of frequent follow up visits to maximize her success with intensive lifestyle modifications for her multiple health conditions.   ATTESTASTION STATEMENTS:  Reviewed by clinician on day of visit: allergies, medications, problem list, medical history, surgical history, family history, social history, and previous encounter notes.     Worthy Rancher, MD

## 2022-10-12 LAB — COLOGUARD: COLOGUARD: POSITIVE — AB

## 2022-10-12 LAB — EXTERNAL GENERIC LAB PROCEDURE: COLOGUARD: POSITIVE — AB

## 2022-10-14 ENCOUNTER — Telehealth: Payer: Self-pay | Admitting: Internal Medicine

## 2022-10-14 ENCOUNTER — Other Ambulatory Visit: Payer: Self-pay | Admitting: *Deleted

## 2022-10-14 DIAGNOSIS — R195 Other fecal abnormalities: Secondary | ICD-10-CM

## 2022-10-14 NOTE — Telephone Encounter (Signed)
Inbound call from patient stating she had a recent positive cologuard test. Patient has a recall for a virtual colonoscopy in 2028 due to hernia. Requesting a call back regarding scheduled colonoscopy. Please advise, thank you.

## 2022-10-14 NOTE — Telephone Encounter (Signed)
Patient had recent positive cologuard testing 10/06/22 (as ordered by PCP). Patient has a history of large ventral hernia, making it less than ideal to perform traditional colonoscopy. At 06/05/21 office visit, it was decided that patient would need virtual colonoscopy. Following the virtual colonoscopy, patient sent note (09/10/22) that she did not feel she would be able to undergo another virtual colonoscopy due to the severe pain/discomfort from her hernia during that procedure.   Patient has a family history of colon cancer and colon polyps (mother).   She recently saw general surgery at [facility] regarding the ventral hernia. She was advised to complete 80 pound weight loss prior to having surgery.  BMI>50, so if traditional colonoscopy were attempted, it would need to be performed in the hospital setting.  Dr Rhea Belton- Please advise... would you like to see her in office to discuss or are you able to make recommendations based off of this information?

## 2022-10-14 NOTE — Telephone Encounter (Signed)
I have spoken to patient to advise of Dr Lauro Franklin recommendation. She has scheduled a colonoscopy at [facility] for 12/23/22 at 10:30 am, 9:00 am arrival. Patient has been scheduled for telephone previsit on 11/26/22 at 4 pm

## 2022-10-14 NOTE — Telephone Encounter (Signed)
If she has had a positive Cologuard then colonoscopy should be strongly considered and is recommended In the presence of the ventral hernia colonoscopy is higher than average risk for incomplete exam due to the hernia presence, perforation and retained there This should be performed in the outpatient hospital setting due to her BMI but also given the availability of carbon dioxide for colonic insufflation which will have much less likelihood of being trapped post procedure

## 2022-10-15 ENCOUNTER — Other Ambulatory Visit (HOSPITAL_COMMUNITY): Payer: Self-pay

## 2022-10-15 NOTE — Telephone Encounter (Signed)
I have spoken to patient to advise of telephone previsit 8/27 as well as hospital colonoscopy procedure 9/23. Patient verbalizes understanding.

## 2022-10-30 ENCOUNTER — Other Ambulatory Visit: Payer: Self-pay

## 2022-10-30 ENCOUNTER — Other Ambulatory Visit (HOSPITAL_BASED_OUTPATIENT_CLINIC_OR_DEPARTMENT_OTHER): Payer: Self-pay

## 2022-10-30 MED ORDER — METRONIDAZOLE 0.75 % EX CREA
1.0000 | TOPICAL_CREAM | Freq: Two times a day (BID) | CUTANEOUS | 2 refills | Status: DC
  Filled 2022-10-30: qty 45, 30d supply, fill #0

## 2022-10-30 MED ORDER — SEMAGLUTIDE-WEIGHT MANAGEMENT 2.4 MG/0.75ML ~~LOC~~ SOAJ
2.4000 mg | SUBCUTANEOUS | 0 refills | Status: DC
  Filled 2022-10-30: qty 3, 28d supply, fill #0

## 2022-10-30 MED ORDER — OMEPRAZOLE 40 MG PO CPDR: 40.0000 mg | DELAYED_RELEASE_CAPSULE | Freq: Every day | ORAL | 3 refills | Status: DC

## 2022-10-30 MED ORDER — ROSUVASTATIN CALCIUM 20 MG PO TABS
20.0000 mg | ORAL_TABLET | Freq: Every day | ORAL | 1 refills | Status: DC
Start: 2022-05-27 — End: 2023-09-03
  Filled 2022-10-30: qty 30, 30d supply, fill #0

## 2022-10-30 MED ORDER — DICYCLOMINE HCL 10 MG PO CAPS
10.0000 mg | ORAL_CAPSULE | Freq: Four times a day (QID) | ORAL | 0 refills | Status: DC | PRN
  Filled 2022-10-30 – 2023-07-28 (×2): qty 120, 30d supply, fill #0

## 2022-10-31 ENCOUNTER — Other Ambulatory Visit (HOSPITAL_BASED_OUTPATIENT_CLINIC_OR_DEPARTMENT_OTHER): Payer: Self-pay

## 2022-11-04 ENCOUNTER — Other Ambulatory Visit (HOSPITAL_COMMUNITY): Payer: Self-pay

## 2022-11-11 ENCOUNTER — Encounter (INDEPENDENT_AMBULATORY_CARE_PROVIDER_SITE_OTHER): Payer: Self-pay | Admitting: Internal Medicine

## 2022-11-11 ENCOUNTER — Other Ambulatory Visit (HOSPITAL_BASED_OUTPATIENT_CLINIC_OR_DEPARTMENT_OTHER): Payer: Self-pay

## 2022-11-11 ENCOUNTER — Ambulatory Visit (INDEPENDENT_AMBULATORY_CARE_PROVIDER_SITE_OTHER): Payer: 59 | Admitting: Internal Medicine

## 2022-11-11 VITALS — BP 110/70 | HR 64 | Temp 97.7°F | Ht 63.0 in | Wt 317.0 lb

## 2022-11-11 DIAGNOSIS — Z6841 Body Mass Index (BMI) 40.0 and over, adult: Secondary | ICD-10-CM

## 2022-11-11 DIAGNOSIS — R7303 Prediabetes: Secondary | ICD-10-CM | POA: Diagnosis not present

## 2022-11-11 DIAGNOSIS — K5900 Constipation, unspecified: Secondary | ICD-10-CM

## 2022-11-11 MED ORDER — SEMAGLUTIDE-WEIGHT MANAGEMENT 1.7 MG/0.75ML ~~LOC~~ SOAJ
1.7000 mg | SUBCUTANEOUS | 0 refills | Status: DC
  Filled 2022-11-11: qty 3, 28d supply, fill #0

## 2022-11-11 NOTE — Progress Notes (Unsigned)
Office: [phone]  /  Fax: [phone]  WEIGHT SUMMARY AND BIOMETRICS  Vitals Temp: 97.7 F (36.5 C) BP: 110/70 Pulse Rate: 64 SpO2: 97 %   Anthropometric Measurements Height: 5\' 3"  (1.6 m) Weight: (!) 317 lb (143.8 kg) BMI (Calculated): 56.17 Weight at Last Visit: 316 lb Weight Lost Since Last Visit: 0 lb Weight Gained Since Last Visit: 1 lb Starting Weight: 312 lb Total Weight Loss (lbs): 0 lb (0 kg) Peak Weight: 343 lb   Body Composition  Body Fat %: 57.8 % Fat Mass (lbs): 183.6 lbs Muscle Mass (lbs): 127.4 lbs Visceral Fat Rating : 25    No data recorded Today's Visit #: 4  Starting Date: 08/06/22   HPI  Chief Complaint: OBESITY  Chelsea Soto is here to discuss her progress with her obesity treatment plan. She is not currently following a plan and states. She states she is not.  Interval History:  Since last office visit she has ***. She reports {EMADHERENCE:28838::"good adherence to reduced calorie nutritional plan."} She has been working on [employer]: {ACTIONS;DENIES/REPORTS:21021675::"Denies"} problems with appetite and hunger signals.  {ACTIONS;DENIES/REPORTS:21021675::"Denies"} problems with satiety and satiation.  {ACTIONS;DENIES/REPORTS:21021675::"Denies"} problems with eating patterns and portion control.  {ACTIONS;DENIES/REPORTS:21021675::"Denies"} abnormal cravings. {ACTIONS;DENIES/REPORTS:21021675::"Denies"} feeling deprived or restricted.   Barriers identified: {EMOBESITYBARRIERS:28841}.   Pharmacotherapy for weight loss: She is currently taking {EMPharmaco:28845}.    ASSESSMENT AND PLAN  TREATMENT PLAN FOR OBESITY:  Recommended Dietary Goals  Season is currently in the action stage of change. As such, her goal is to continue weight management plan. She has agreed to: {EMWTLOSSPLAN:29297::"continue current plan"}  Behavioral Intervention  We discussed the following Behavioral Modification  Strategies today: {EMWMwtlossstrategies:28914::"increasing lean protein intake","decreasing simple carbohydrates ","increasing vegetables","increasing lower glycemic fruits","increasing water intake","continue to practice mindfulness when eating","planning for success"}.  Additional resources provided today: {EMadditionalresources:29169::"None"}  Recommended Physical Activity Goals  Celita has been advised to work up to 150 minutes of moderate intensity aerobic activity a week and strengthening exercises 2-3 times per week for cardiovascular health, weight loss maintenance and preservation of muscle mass.   She has agreed to :  {EMEXERCISE:28847::"Think about ways to increase daily physical activity and overcoming barriers to exercise"}  Pharmacotherapy We discussed various medication options to help Meliha with her weight loss efforts and we both agreed to : {EMagreedrx:29170::"continue with nutritional and behavioral strategies"}  ASSOCIATED CONDITIONS ADDRESSED TODAY  There are no diagnoses linked to this encounter.  PHYSICAL EXAM:  Blood pressure 110/70, pulse 64, temperature 97.7 F (36.5 C), height 5\' 3"  (1.6 m), weight (!) 317 lb (143.8 kg), SpO2 97%. Body mass index is 56.15 kg/m.  General: She is overweight, cooperative, alert, well developed, and in no acute distress. PSYCH: Has normal mood, affect and thought process.   HEENT: EOMI, sclerae are anicteric. Lungs: Normal breathing effort, no conversational dyspnea. Extremities: No edema.  Neurologic: No gross sensory or motor deficits. No tremors or fasciculations noted.    DIAGNOSTIC DATA REVIEWED:  BMET    Component Value Date/Time   NA 140 08/06/2022 1224   K 4.3 08/06/2022 1224   CL 99 08/06/2022 1224   CO2 22 08/06/2022 1224   GLUCOSE 109 (H) 08/06/2022 1224   GLUCOSE 107 (H) 08/02/2021 0719   BUN 13 08/06/2022 1224   CREATININE 0.54 (L) 08/06/2022 1224   CALCIUM 10.0 08/06/2022 1224   GFRNONAA >60  08/02/2021 0719   GFRAA >60 01/18/2019 1014   Lab Results  Component Value Date   HGBA1C 6.2 (H) 06/27/2020  Lab Results  Component Value Date   INSULIN 24.1 08/06/2022   INSULIN 19.4 06/27/2020   Lab Results  Component Value Date   TSH 3.380 06/27/2020   CBC    Component Value Date/Time   WBC 8.7 08/02/2021 0719   RBC 4.66 08/02/2021 0719   HGB 12.6 08/02/2021 0719   HGB 13.5 06/27/2020 1020   HCT 39.9 08/02/2021 0719   HCT 41.1 06/27/2020 1020   PLT 284 08/02/2021 0719   PLT 337 06/27/2020 1020   MCV 85.6 08/02/2021 0719   MCV 85 06/27/2020 1020   MCH 27.0 08/02/2021 0719   MCHC 31.6 08/02/2021 0719   RDW 14.3 08/02/2021 0719   RDW 13.8 06/27/2020 1020   Iron Studies No results found for: "IRON", "TIBC", "FERRITIN", "IRONPCTSAT" Lipid Panel     Component Value Date/Time   CHOL 176 08/06/2022 1224   TRIG 194 (H) 08/06/2022 1224   HDL 41 08/06/2022 1224   CHOLHDL 3.9 06/27/2020 1020   LDLCALC 101 (H) 08/06/2022 1224   Hepatic Function Panel     Component Value Date/Time   PROT 7.4 08/06/2022 1224   ALBUMIN 4.6 08/06/2022 1224   AST 15 08/06/2022 1224   ALT 19 08/06/2022 1224   ALKPHOS 120 08/06/2022 1224   BILITOT 0.5 08/06/2022 1224   BILIDIR <0.1 09/12/2020 1010   IBILI NOT CALCULATED 09/12/2020 1010      Component Value Date/Time   TSH 3.380 06/27/2020 1020   Nutritional Lab Results  Component Value Date   VD25OH 43.5 08/06/2022   VD25OH 53.3 06/27/2020     No follow-ups on file.Marland Kitchen She was informed of the importance of frequent follow up visits to maximize her success with intensive lifestyle modifications for her multiple health conditions.   ATTESTASTION STATEMENTS:  Reviewed by clinician on day of visit: allergies, medications, problem list, medical history, surgical history, family history, social history, and previous encounter notes.     Worthy Rancher, MD

## 2022-11-12 DIAGNOSIS — K5900 Constipation, unspecified: Secondary | ICD-10-CM | POA: Insufficient documentation

## 2022-11-12 MED ORDER — BISACODYL 5 MG PO TBEC: 5.0000 mg | DELAYED_RELEASE_TABLET | Freq: Every day | ORAL | Status: DC | PRN

## 2022-11-12 MED ORDER — POLYETHYLENE GLYCOL 3350 17 GM/SCOOP PO POWD: 17.0000 g | Freq: Two times a day (BID) | ORAL | Status: DC | PRN

## 2022-11-12 NOTE — Assessment & Plan Note (Signed)
Likely secondary to medications.  Her constipation precedes the use of incretin therapy.  We discussed maintaining adequate hydration, increasing fiber.  She will also start MiraLAX 1-2 servings a day and may add Dulcolax as needed.  We discussed discontinuing use of dicyclomine as some of her stomach cramping may be secondary to constipation.

## 2022-11-12 NOTE — Assessment & Plan Note (Signed)
Most recent A1c is  Lab Results  Component Value Date   HGBA1C 6.2 (H) 06/27/2020  . Patient informed of disease state and risk of progression. This may contribute to abnormal cravings, fatigue and diabetes complications without having diabetes.   We reviewed treatment options which include losing 7 to 10% of body weight, increasing physical activity to a 150 minutes a week of moderate intensity.  We will increase semaglutide to 1.7 mg once a week for pharmacoprophylaxis and weight management.

## 2022-11-18 ENCOUNTER — Other Ambulatory Visit (HOSPITAL_BASED_OUTPATIENT_CLINIC_OR_DEPARTMENT_OTHER): Payer: Self-pay

## 2022-11-21 ENCOUNTER — Other Ambulatory Visit (HOSPITAL_BASED_OUTPATIENT_CLINIC_OR_DEPARTMENT_OTHER): Payer: Self-pay

## 2022-11-21 ENCOUNTER — Telehealth: Payer: Self-pay | Admitting: *Deleted

## 2022-11-21 NOTE — Telephone Encounter (Signed)
Chelsea Soto,  This pt's BMI is greater than 50; their procedure will need to be performed at the hospital.  Thanks,  [name] 

## 2022-11-21 NOTE — Telephone Encounter (Signed)
It appears that this patient is already scheduled at [facility] on 12/23/2022-- note to [facility] chart

## 2022-11-26 ENCOUNTER — Ambulatory Visit (AMBULATORY_SURGERY_CENTER): Payer: 59 | Admitting: *Deleted

## 2022-11-26 ENCOUNTER — Other Ambulatory Visit (HOSPITAL_BASED_OUTPATIENT_CLINIC_OR_DEPARTMENT_OTHER): Payer: Self-pay

## 2022-11-26 VITALS — Ht 63.0 in | Wt 310.0 lb

## 2022-11-26 DIAGNOSIS — R195 Other fecal abnormalities: Secondary | ICD-10-CM

## 2022-11-26 DIAGNOSIS — Z86010 Personal history of colonic polyps: Secondary | ICD-10-CM

## 2022-11-26 MED ORDER — NA SULFATE-K SULFATE-MG SULF 17.5-3.13-1.6 GM/177ML PO SOLN
1.0000 | Freq: Once | ORAL | 0 refills | Status: DC
Start: 2022-11-26 — End: 2022-12-03
  Filled 2022-11-26: qty 354, 1d supply, fill #0

## 2022-11-26 NOTE — Progress Notes (Signed)
Pt's name and DOB verified at the beginning of the pre-visit.  Pt denies any difficulty with ambulating,sitting, laying down or rolling side to side Gave both [facility] main # and MD on call # prior to instructions.  No egg or soy allergy known to patient  No issues known to pt with past sedation with any surgeries or procedures Pt denies having issues being intubated Pt has no issues moving head neck or swallowing No FH of Malignant Hyperthermia Pt is not on diet pills Pt is not on home 02  Pt is not on blood thinners  Pt has frequent issues with constipation RN instructed pt to use Miralax per bottles instructions a week before prep days. Pt states they will Pt is not on dialysis Pt denise any abnormal heart rhythms  Pt denies any upcoming cardiac testing Pt encouraged to use to use Singlecare or Goodrx to reduce cost  Patient's chart reviewed by Cathlyn Parsons CNRA prior to pre-visit and patient appropriate for the [facility].  Pre-visit completed and red dot placed by patient's name on their procedure day (on provider's schedule).  . Visit by phone Pt states weight is 310 lb Instructed pt why it is important to and  to call if they have any changes in health or new medications. Directed them to the # given and on instructions.   Pt states they will.  Instructions reviewed with pt and pt states understanding. Instructed to review again prior to procedure. Pt states they will.  Instructions sent by mail with coupon and by my chart

## 2022-12-03 ENCOUNTER — Other Ambulatory Visit: Payer: Self-pay

## 2022-12-03 ENCOUNTER — Other Ambulatory Visit (HOSPITAL_BASED_OUTPATIENT_CLINIC_OR_DEPARTMENT_OTHER): Payer: Self-pay

## 2022-12-03 ENCOUNTER — Telehealth: Payer: Self-pay | Admitting: Internal Medicine

## 2022-12-03 DIAGNOSIS — R195 Other fecal abnormalities: Secondary | ICD-10-CM

## 2022-12-03 MED ORDER — NA SULFATE-K SULFATE-MG SULF 17.5-3.13-1.6 GM/177ML PO SOLN
1.0000 | Freq: Once | ORAL | 0 refills | Status: AC
  Filled 2022-12-09: qty 354, 1d supply, fill #0

## 2022-12-03 NOTE — Telephone Encounter (Signed)
PT is calling to have SUPREP called into [employer] at [employer]. Please advise.

## 2022-12-03 NOTE — Telephone Encounter (Signed)
Rx sent VM left making patient aware

## 2022-12-04 ENCOUNTER — Ambulatory Visit (INDEPENDENT_AMBULATORY_CARE_PROVIDER_SITE_OTHER): Payer: 59 | Admitting: Internal Medicine

## 2022-12-04 ENCOUNTER — Other Ambulatory Visit (HOSPITAL_BASED_OUTPATIENT_CLINIC_OR_DEPARTMENT_OTHER): Payer: Self-pay

## 2022-12-04 ENCOUNTER — Encounter (INDEPENDENT_AMBULATORY_CARE_PROVIDER_SITE_OTHER): Payer: Self-pay | Admitting: Internal Medicine

## 2022-12-04 VITALS — BP 126/84 | HR 61 | Temp 97.6°F | Ht 63.0 in | Wt 315.0 lb

## 2022-12-04 DIAGNOSIS — K5900 Constipation, unspecified: Secondary | ICD-10-CM | POA: Diagnosis not present

## 2022-12-04 DIAGNOSIS — I10 Essential (primary) hypertension: Secondary | ICD-10-CM

## 2022-12-04 DIAGNOSIS — R7303 Prediabetes: Secondary | ICD-10-CM | POA: Diagnosis not present

## 2022-12-04 DIAGNOSIS — Z6841 Body Mass Index (BMI) 40.0 and over, adult: Secondary | ICD-10-CM

## 2022-12-04 MED ORDER — SEMAGLUTIDE-WEIGHT MANAGEMENT 1.7 MG/0.75ML ~~LOC~~ SOAJ
1.7000 mg | SUBCUTANEOUS | 0 refills | Status: DC
  Filled 2022-12-04: qty 3, 28d supply, fill #0

## 2022-12-04 NOTE — Progress Notes (Unsigned)
Office: [phone]  /  Fax: [phone]  WEIGHT SUMMARY AND BIOMETRICS  Vitals Temp: 97.6 F (36.4 C) BP: 126/84 Pulse Rate: 61 SpO2: 96 %   Anthropometric Measurements Height: 5\' 3"  (1.6 m) Weight: (!) 315 lb (142.9 kg) BMI (Calculated): 55.81 Weight at Last Visit: 317 lb Weight Lost Since Last Visit: 2 lb Weight Gained Since Last Visit: 0 lb Starting Weight: 312 lb Total Weight Loss (lbs): 0 lb (0 kg)   Body Composition  Body Fat %: 57.7 % Fat Mass (lbs): 181.8 lbs Muscle Mass (lbs): 126.6 lbs Visceral Fat Rating : 24    No data recorded Today's Visit #: 5  Starting Date: 08/06/22   HPI  Chief Complaint: OBESITY  Chelsea Soto is here to discuss her progress with her obesity treatment plan. She is on the the Category 3 Plan and states she is following her eating plan approximately 0 % of the time. She states she is not exercising.  Interval History:  Since last office visit she has lost 2 lbs. Notices stress eating.  She reports {EMADHERENCE:28838::"good adherence to reduced calorie nutritional plan."} She has been working on not skipping meals  Orexigenic Control: Denies problems with appetite and hunger signals.  Denies problems with satiety and satiation.  Denies problems with eating patterns and portion control.  Denies abnormal cravings. Denies feeling deprived or restricted.   Barriers identified: {EMOBESITYBARRIERS:28841}.   Pharmacotherapy for weight loss: She is currently taking {EMPharmaco:28845}.    ASSESSMENT AND PLAN  TREATMENT PLAN FOR OBESITY:  Recommended Dietary Goals  Chelsea Soto is currently in the action stage of change. As such, her goal is to continue weight management plan. She has agreed to: {EMWTLOSSPLAN:29297::"continue current plan"}  Behavioral Intervention  We discussed the following Behavioral Modification Strategies today: {EMWMwtlossstrategies:28914::"increasing lean protein intake","decreasing simple carbohydrates  ","increasing vegetables","increasing lower glycemic fruits","increasing water intake","continue to practice mindfulness when eating","planning for success"}.  Additional resources provided today: {EMadditionalresources:29169::"None"}  Recommended Physical Activity Goals  Chelsea Soto has been advised to work up to 150 minutes of moderate intensity aerobic activity a week and strengthening exercises 2-3 times per week for cardiovascular health, weight loss maintenance and preservation of muscle mass.   She has agreed to :  {EMEXERCISE:28847::"Think about ways to increase daily physical activity and overcoming barriers to exercise"}  Pharmacotherapy We discussed various medication options to help Chelsea Soto with her weight loss efforts and we both agreed to : {EMagreedrx:29170::"continue with nutritional and behavioral strategies"}  ASSOCIATED CONDITIONS ADDRESSED TODAY  There are no diagnoses linked to this encounter.  PHYSICAL EXAM:  Blood pressure 126/84, pulse 61, temperature 97.6 F (36.4 C), height 5\' 3"  (1.6 m), weight (!) 315 lb (142.9 kg), SpO2 96%. Body mass index is 55.8 kg/m.  General: She is overweight, cooperative, alert, well developed, and in no acute distress. PSYCH: Has normal mood, affect and thought process.   HEENT: EOMI, sclerae are anicteric. Lungs: Normal breathing effort, no conversational dyspnea. Extremities: No edema.  Neurologic: No gross sensory or motor deficits. No tremors or fasciculations noted.    DIAGNOSTIC DATA REVIEWED:  BMET    Component Value Date/Time   NA 140 08/06/2022 1224   K 4.3 08/06/2022 1224   CL 99 08/06/2022 1224   CO2 22 08/06/2022 1224   GLUCOSE 109 (H) 08/06/2022 1224   GLUCOSE 107 (H) 08/02/2021 0719   BUN 13 08/06/2022 1224   CREATININE 0.54 (L) 08/06/2022 1224   CALCIUM 10.0 08/06/2022 1224   GFRNONAA >60 08/02/2021 0719   GFRAA >60  01/18/2019 1014   Lab Results  Component Value Date   HGBA1C 6.2 (H) 06/27/2020   Lab  Results  Component Value Date   INSULIN 24.1 08/06/2022   INSULIN 19.4 06/27/2020   Lab Results  Component Value Date   TSH 3.380 06/27/2020   CBC    Component Value Date/Time   WBC 8.7 08/02/2021 0719   RBC 4.66 08/02/2021 0719   HGB 12.6 08/02/2021 0719   HGB 13.5 06/27/2020 1020   HCT 39.9 08/02/2021 0719   HCT 41.1 06/27/2020 1020   PLT 284 08/02/2021 0719   PLT 337 06/27/2020 1020   MCV 85.6 08/02/2021 0719   MCV 85 06/27/2020 1020   MCH 27.0 08/02/2021 0719   MCHC 31.6 08/02/2021 0719   RDW 14.3 08/02/2021 0719   RDW 13.8 06/27/2020 1020   Iron Studies No results found for: "IRON", "TIBC", "FERRITIN", "IRONPCTSAT" Lipid Panel     Component Value Date/Time   CHOL 176 08/06/2022 1224   TRIG 194 (H) 08/06/2022 1224   HDL 41 08/06/2022 1224   CHOLHDL 3.9 06/27/2020 1020   LDLCALC 101 (H) 08/06/2022 1224   Hepatic Function Panel     Component Value Date/Time   PROT 7.4 08/06/2022 1224   ALBUMIN 4.6 08/06/2022 1224   AST 15 08/06/2022 1224   ALT 19 08/06/2022 1224   ALKPHOS 120 08/06/2022 1224   BILITOT 0.5 08/06/2022 1224   BILIDIR <0.1 09/12/2020 1010   IBILI NOT CALCULATED 09/12/2020 1010      Component Value Date/Time   TSH 3.380 06/27/2020 1020   Nutritional Lab Results  Component Value Date   VD25OH 43.5 08/06/2022   VD25OH 53.3 06/27/2020     No follow-ups on file.Marland Kitchen She was informed of the importance of frequent follow up visits to maximize her success with intensive lifestyle modifications for her multiple health conditions.   ATTESTASTION STATEMENTS:  Reviewed by clinician on day of visit: allergies, medications, problem list, medical history, surgical history, family history, social history, and previous encounter notes.     Worthy Rancher, MD

## 2022-12-05 NOTE — Assessment & Plan Note (Signed)
 See obesity treatment plan

## 2022-12-05 NOTE — Assessment & Plan Note (Signed)
Most recent A1c is  Lab Results  Component Value Date   HGBA1C 6.2 (H) 06/27/2020  . Patient informed of disease state and risk of progression. This may contribute to abnormal cravings, fatigue and diabetes complications without having diabetes.   In addition to losing 7 to 10% of body weight, increasing physical activity to a 150 minutes a week of moderate intensity.  She will continue semaglutide at 1.7 mg once a week for pharmacoprophylaxis and weight management.  We will not increase medication as she is not having side effects and is having adequate orixegenic control.

## 2022-12-05 NOTE — Assessment & Plan Note (Signed)
Improved.  Continue to use MiraLAX and Dulcolax as needed.  She is no longer on anticholinergics.  Continue to monitor

## 2022-12-05 NOTE — Assessment & Plan Note (Signed)
Blood pressure close to goal for age and risk category.  On lisinopril hydrochlorothiazide 1 tablet daily without adverse effects.  Most recent renal parameters reviewed which showed normal electrolytes and kidney function.  Continue with weight loss therapy.  Monitor for symptoms of orthostasis while losing weight. Continue current regimen and home monitoring for a goal blood pressure of 120/80.

## 2022-12-08 IMAGING — CT CT VIRTUAL COLONOSCOPY SCREENING
3 of 13 series · 11 of 46 positions shown, 17 images · non-contrast
Comparison: Six hundred twenty-two abdominopelvic CT

CLINICAL DATA: Colon cancer screening.  Known ventral hernia.

EXAM:
CT VIRTUAL COLONOSCOPY SCREENING
TECHNIQUE: The patient was given a standard bowel preparation with Gastrografin
and barium for fluid and stool tagging respectively. The quality of
the bowel preparation is good. Automated CO2 insufflation of the
colon was performed prior to image acquisition and colonic
distention is excellent. Image post processing was used to generate
a 3D endoluminal fly-through projection of the colon and to
electronically subtract stool/fluid as appropriate.

[Series 10: supine colon 1.50 br40 s3 supine thins · axial · 0.98mm/px · z∈[+1178,+1515]mm · 4 of 377 slices shown]
[im 76/377  soft-tissue]
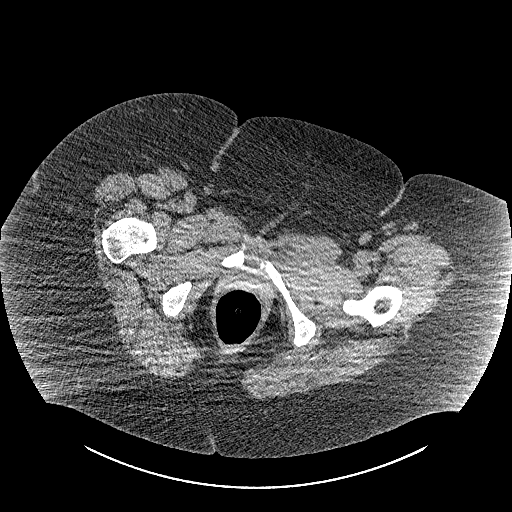
[im 151/377  soft-tissue]
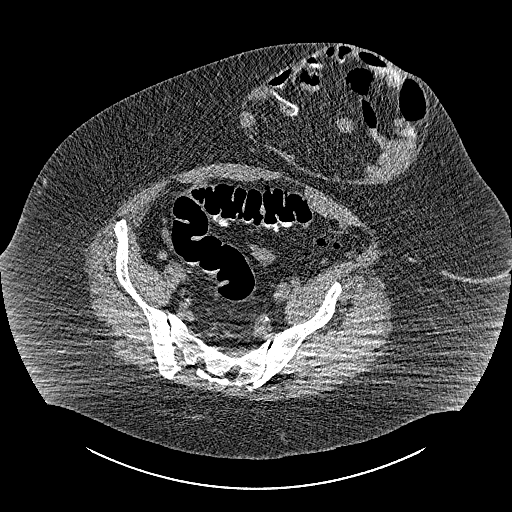
[im 226/377  soft-tissue]
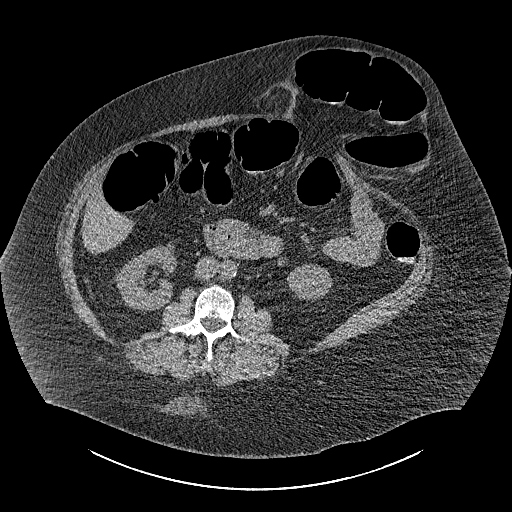
[im 301/377  soft-tissue]
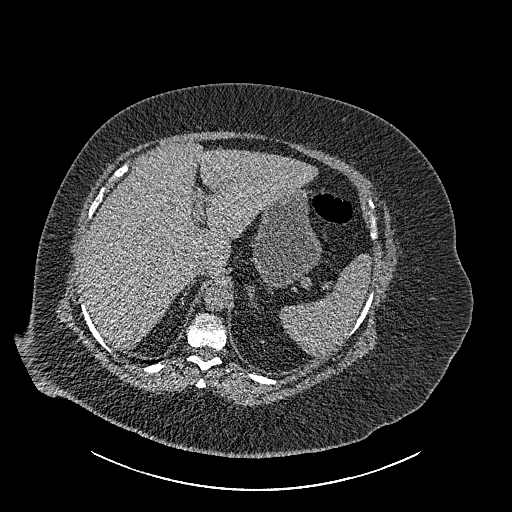

[Series 12: supine colon 3.00 br40 s3 cor supine · coronal · 0.98mm/px · 2 of 166 slices shown, 3 images]
[im 56/166  soft-tissue]
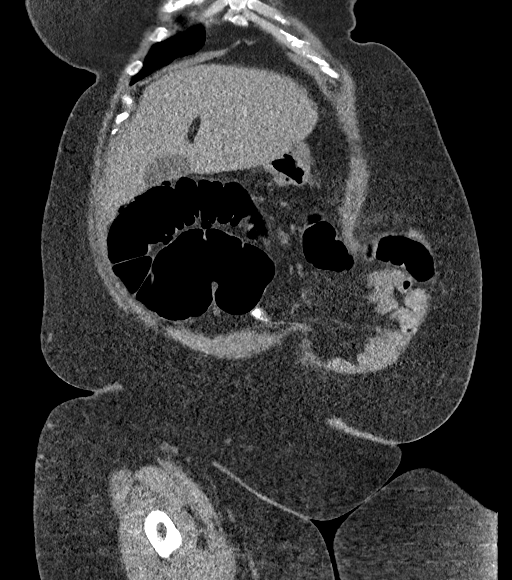
[im 56/166  bone]
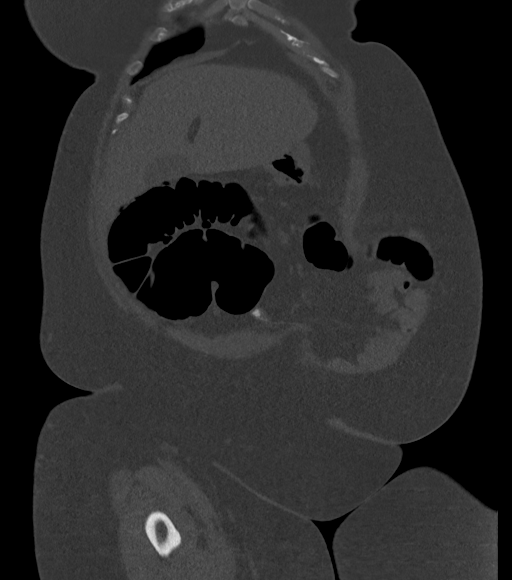
[im 111/166  soft-tissue]
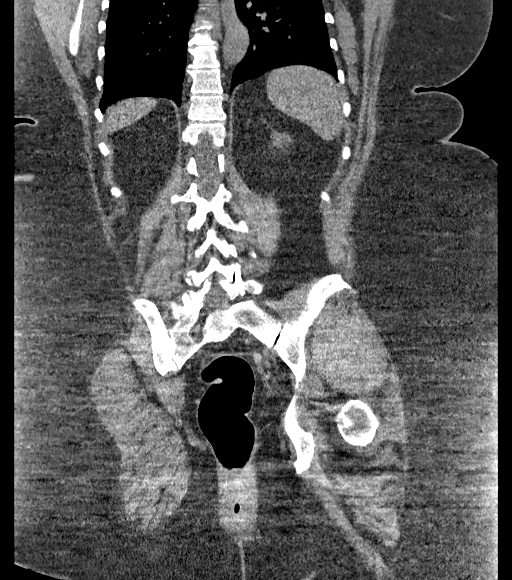

[Series 17: prone colon 1.50 br40 s3 prone thin · axial · 0.98mm/px · z∈[+1010,+1383]mm · 5 of 375 slices shown, 10 images]
[im 63/375  soft-tissue]
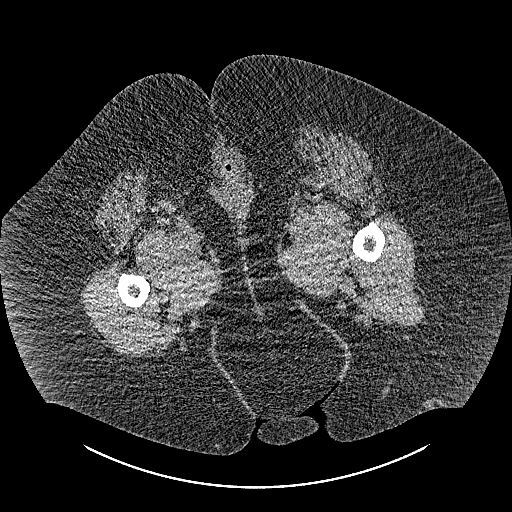
[im 63/375  bone]
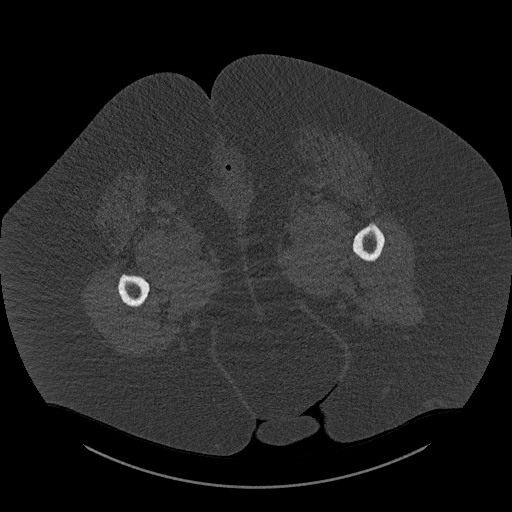
[im 125/375  soft-tissue]
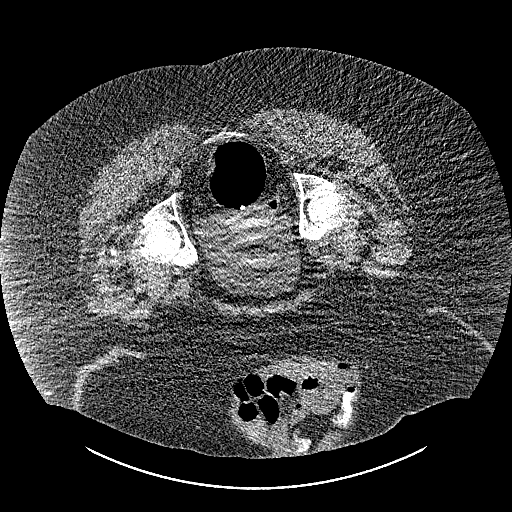
[im 125/375  lung]
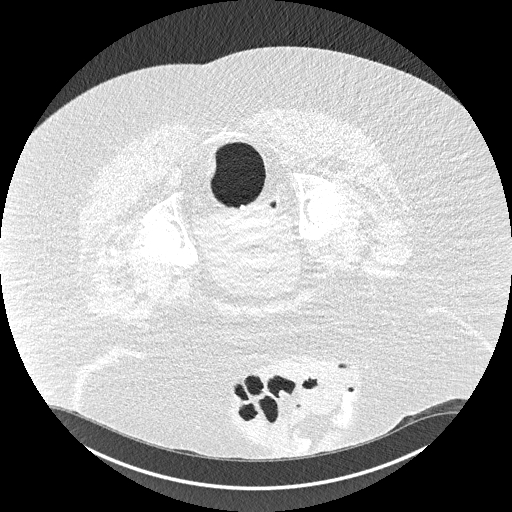
[im 188/375  soft-tissue]
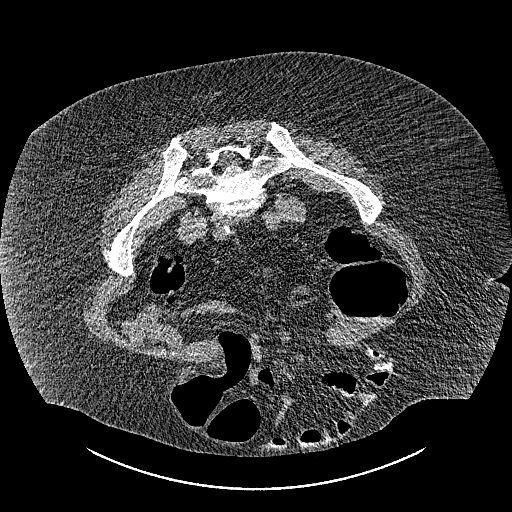
[im 188/375  lung]
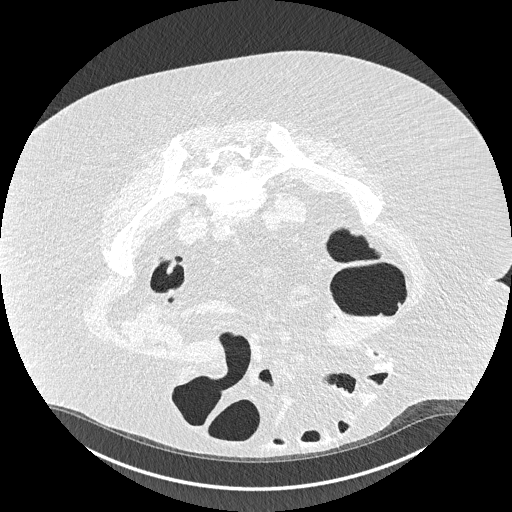
[im 250/375  soft-tissue]
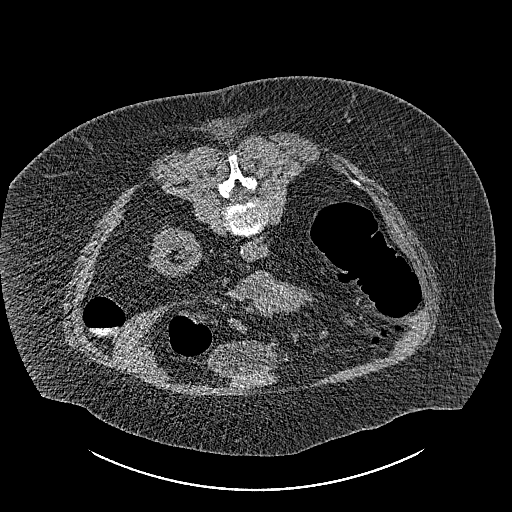
[im 250/375  lung]
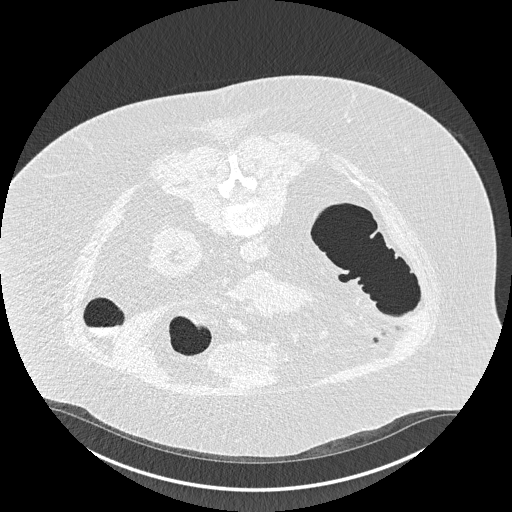
[im 312/375  soft-tissue]
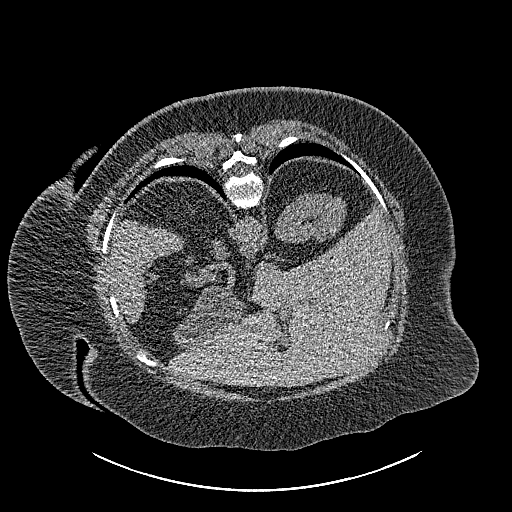
[im 312/375  lung]
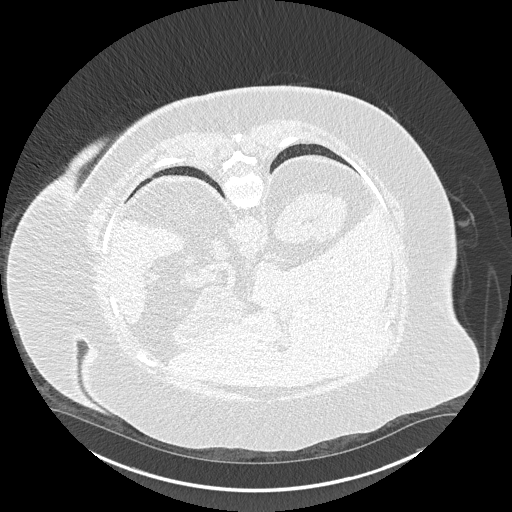

[11 of 46 positions shown; findings below may reference images not displayed]

FINDINGS: VIRTUAL COLONOSCOPY

Sigmoid and distal descending colon secondary to underdistention.
Relatively high quality exam otherwise, given patient body habitus.
No evidence of clinically significant colonic polyp or mass.
Scattered colonic diverticula, especially distally.

Virtual colonoscopy is not designed to detect diminutive polyps
(i.e., less than or equal to 5 mm), the presence or absence of which
may not affect clinical management.

CT ABDOMEN AND PELVIS WITHOUT CONTRAST

Lower chest: Clear lung bases. Normal heart size without pericardial
or pleural effusion. Right coronary artery calcification.

Hepatobiliary: Mild hepatomegaly at 17.5 cm craniocaudal. Normal
gallbladder, without biliary ductal dilatation.

Pancreas: Normal, without mass or ductal dilatation.

Spleen: Normal in size, without focal abnormality.

Adrenals/Urinary Tract: Normal right adrenal gland. Left adrenal
cm nodule has indeterminate density measurements but is similar in
size to 09/14/2020 and is consistent with an adenoma on prior
noncontrast exam. No renal calculi or hydronephrosis. No hydroureter
or ureteric calculi. Degraded evaluation of the pelvis, secondary to
patient body habitus. No gross bladder abnormality.

Stomach/Bowel: Normal stomach, without wall thickening. Again
identified is a left paramidline wide-mouth ventral abdominal wall
hernia which contains nonobstructive transverse colon and small
bowel. The cecum is mobile, positioned in the upper central abdomen.
Normal appendix and terminal ileum. Otherwise normal small bowel.

Vascular/Lymphatic: Aortic atherosclerosis. No abdominopelvic
adenopathy.

Reproductive: Normal uterus and adnexa.

Other: No significant free fluid.  No free intraperitoneal air.

Musculoskeletal: Lumbosacral spondylosis.
IMPRESSION: 1. No evidence of clinically significant colonic polyp or mass. Mild
limitations as detailed above.
2. Left adrenal adenoma.
3. Enlargement of a large ventral abdominal wall hernia containing
nonobstructive large and small bowel.
4.  Aortic Atherosclerosis ([id number]).
5. Age advanced coronary artery atherosclerosis. Recommend
assessment of coronary risk factors.

## 2022-12-09 ENCOUNTER — Other Ambulatory Visit (HOSPITAL_BASED_OUTPATIENT_CLINIC_OR_DEPARTMENT_OTHER): Payer: Self-pay

## 2022-12-09 MED ORDER — ALPRAZOLAM 0.5 MG PO TABS
0.5000 mg | ORAL_TABLET | Freq: Three times a day (TID) | ORAL | 0 refills | Status: DC | PRN
  Filled 2022-12-09: qty 90, 30d supply, fill #0

## 2022-12-13 ENCOUNTER — Encounter: Payer: Self-pay | Admitting: Internal Medicine

## 2022-12-16 ENCOUNTER — Encounter (HOSPITAL_COMMUNITY): Payer: Self-pay | Admitting: Internal Medicine

## 2022-12-16 ENCOUNTER — Other Ambulatory Visit: Payer: Self-pay

## 2022-12-16 ENCOUNTER — Other Ambulatory Visit (HOSPITAL_BASED_OUTPATIENT_CLINIC_OR_DEPARTMENT_OTHER): Payer: Self-pay

## 2022-12-16 ENCOUNTER — Telehealth: Payer: Self-pay | Admitting: Internal Medicine

## 2022-12-16 MED ORDER — LISINOPRIL-HYDROCHLOROTHIAZIDE 20-25 MG PO TABS
1.0000 | ORAL_TABLET | Freq: Every day | ORAL | 0 refills | Status: DC
  Filled 2022-12-16: qty 30, 30d supply, fill #0
  Filled 2023-02-19: qty 30, 30d supply, fill #1
  Filled 2023-03-03: qty 30, 30d supply, fill #2

## 2022-12-16 NOTE — Progress Notes (Signed)
Left message for Dr. Lauro Franklin nurse the colonoscopy needs to be rescheduled due to patient's recent cold/URI.  Office was advised that patient needs to be symptom free for 14 days prior to procedure.

## 2022-12-16 NOTE — Telephone Encounter (Signed)
Would prefer she keep as scheduled JMP

## 2022-12-16 NOTE — Telephone Encounter (Signed)
Gwen with [facility] pre cert dept is calling because anesthesia informed them patient has a cold/upper respiratory infection and that she may need to have procedure pushed back. They need her to be symptom free at least 14 days. Please call [phone] for any further details.

## 2022-12-16 NOTE — Telephone Encounter (Signed)
Please let me know who might have availability as I would need to discuss this with that provider directly JMP

## 2022-12-16 NOTE — Telephone Encounter (Signed)
Preadmission states pt has URI and per anesthesia pt has to be symptom free for 14 days prior to being put to sleep for procedure. Her hospital procedure is scheduled for 12/23/22. Next available hosp date is 12/12. Ok to proceed with that date? Pt has hx of colon polyps and had +cologuard.

## 2022-12-16 NOTE — Telephone Encounter (Signed)
See note below:  Gwen with [facility] pre cert dept is calling because anesthesia informed them patient has a cold/upper respiratory infection and that she may need to have procedure pushed back. They need her to be symptom free at least 14 days. Please call [phone] for any further details.   The hospital will not do procedure unless she is symptom free for 14 days. She will have to be rescheduled. Did not know if you wanted her to wait or if you want to see if a partner may be able to do procedure sooner.

## 2022-12-16 NOTE — Progress Notes (Signed)
COVID Vaccine Completed:  Date of COVID positive in last 90 days: Pt states that she developed cold/upper respiratory symptoms on 9/-10-24 and that most symptoms resolved by 12-14-22.  States that she still has a little bit of mucous.  Did not seek medical attention.  Discussed with Christeen Douglas, PA-C who recommended that patient needed to be 14 day symptom free and procedure should be rescheduled.    PCP - Aleatha Borer, NP Cardiologist - N/A  Chest x-ray -  EKG - 08-06-22 Epic Stress Test -  ECHO -  Cardiac Cath -  Pacemaker/ICD device last checked: Spinal Cord Stimulator:  Bowel Prep - Pt has meds and instructions.   Sleep Study - Yes, +sleep apnea BiPap - yes  Fasting Blood Sugar - n/a Checks Blood Sugar _____ times a day  Semaglutide  Last dose of GLP1 agonist-  12-10-22 GLP1 instructions:  Do not take after 12-15-22   Last dose of SGLT-2 inhibitors-  N/A SGLT-2 instructions: N/A  Blood Thinner Instructions:  N/A Aspirin Instructions: Last Dose:  Activity level:  Can go up a flight of stairs and perform activities of daily living without stopping and without symptoms of chest pain or shortness of breath.  Able to exercise without ptoms  Anesthesia review:  Recent cold/URI symptoms  Patient denies shortness of breath, fever, cough and chest pain at PAT appointment (completed over the phone)  Patient verbalized understanding of instructions that were given to them at the PAT appointment. Patient was also instructed that they will need to review over the PAT instructions again at home before surgery.

## 2022-12-17 ENCOUNTER — Other Ambulatory Visit: Payer: Self-pay

## 2022-12-17 DIAGNOSIS — R195 Other fecal abnormalities: Secondary | ICD-10-CM

## 2022-12-17 NOTE — Telephone Encounter (Signed)
Pts colon appt moved to 03/13/23 at 9am for colon at [facility], pt to arrive at 7:30am. Pt aware of appt.

## 2022-12-17 NOTE — Telephone Encounter (Signed)
If that is what she requires based on other medical appt, she can keep the Dec date. I would not repeat cologuard JMP

## 2022-12-17 NOTE — Telephone Encounter (Signed)
Spoke with pt and let her know the new appt date and time. Pt states she just had a new grandson and he will be undergoing a partial heart transplant at [facility] on 10/7 so she will not be able to do that date. She wonders if perhaps doing another cologuard is an option or perhaps waiting until December. Please advise.

## 2022-12-17 NOTE — Telephone Encounter (Signed)
Dr. Marina Goodell has 01/08/23 open, he only has an 8:30am EGD on there now.

## 2022-12-17 NOTE — Telephone Encounter (Signed)
I spoke with Dr. Marina Goodell and he is available for this colonoscopy I am appreciative of his time and assistance

## 2022-12-18 ENCOUNTER — Other Ambulatory Visit (HOSPITAL_BASED_OUTPATIENT_CLINIC_OR_DEPARTMENT_OTHER): Payer: Self-pay

## 2022-12-18 ENCOUNTER — Encounter (HOSPITAL_BASED_OUTPATIENT_CLINIC_OR_DEPARTMENT_OTHER): Payer: Self-pay

## 2022-12-18 MED ORDER — LINZESS 290 MCG PO CAPS
290.0000 ug | ORAL_CAPSULE | Freq: Every day | ORAL | 0 refills | Status: DC
Start: 2022-12-16 — End: 2023-03-31
  Filled 2022-12-18: qty 30, 30d supply, fill #0
  Filled 2023-01-29: qty 30, 30d supply, fill #1
  Filled 2023-03-03: qty 30, 30d supply, fill #2

## 2023-01-07 ENCOUNTER — Telehealth (INDEPENDENT_AMBULATORY_CARE_PROVIDER_SITE_OTHER): Payer: Self-pay | Admitting: Internal Medicine

## 2023-01-07 ENCOUNTER — Encounter (INDEPENDENT_AMBULATORY_CARE_PROVIDER_SITE_OTHER): Payer: Self-pay

## 2023-01-07 NOTE — Telephone Encounter (Signed)
Patient's personal assistant called in asking if the patient Chelsea Soto, Chelsea Soto get a refill on her [facility]. Patient originally had an appointment scheduled on 01/09/23 but has moved it to 02/03/2023. If so, please send to [facility] patient pharmacy at [facility]. Patient has 1 dose left at this time.

## 2023-01-09 ENCOUNTER — Ambulatory Visit (INDEPENDENT_AMBULATORY_CARE_PROVIDER_SITE_OTHER): Payer: 59 | Admitting: Internal Medicine

## 2023-01-09 ENCOUNTER — Other Ambulatory Visit (INDEPENDENT_AMBULATORY_CARE_PROVIDER_SITE_OTHER): Payer: Self-pay

## 2023-01-09 ENCOUNTER — Other Ambulatory Visit (HOSPITAL_BASED_OUTPATIENT_CLINIC_OR_DEPARTMENT_OTHER): Payer: Self-pay

## 2023-01-09 MED ORDER — SEMAGLUTIDE-WEIGHT MANAGEMENT 1.7 MG/0.75ML ~~LOC~~ SOAJ
1.7000 mg | SUBCUTANEOUS | 0 refills | Status: AC
  Filled 2023-01-09: qty 3, 28d supply, fill #0

## 2023-01-30 ENCOUNTER — Other Ambulatory Visit (HOSPITAL_BASED_OUTPATIENT_CLINIC_OR_DEPARTMENT_OTHER): Payer: Self-pay

## 2023-02-03 ENCOUNTER — Ambulatory Visit (INDEPENDENT_AMBULATORY_CARE_PROVIDER_SITE_OTHER): Payer: 59 | Admitting: Internal Medicine

## 2023-02-03 ENCOUNTER — Encounter (INDEPENDENT_AMBULATORY_CARE_PROVIDER_SITE_OTHER): Payer: Self-pay

## 2023-02-05 ENCOUNTER — Telehealth: Payer: Self-pay | Admitting: Internal Medicine

## 2023-02-05 ENCOUNTER — Other Ambulatory Visit (HOSPITAL_BASED_OUTPATIENT_CLINIC_OR_DEPARTMENT_OTHER): Payer: Self-pay

## 2023-02-05 MED ORDER — ALPRAZOLAM 0.5 MG PO TABS
0.5000 mg | ORAL_TABLET | Freq: Three times a day (TID) | ORAL | 0 refills | Status: DC | PRN
  Filled 2023-02-05: qty 90, 30d supply, fill #0

## 2023-02-05 NOTE — Telephone Encounter (Signed)
Inbound call from patient wishing to cancel 03/13/23 colonoscopy at [facility]. States she is unable to make an appointment and will call back to reschedule. Please advise, thank you

## 2023-02-05 NOTE — Telephone Encounter (Signed)
Appt cancelled per pt request, she states she will call back to reschedule.

## 2023-02-12 ENCOUNTER — Telehealth: Payer: Self-pay | Admitting: Internal Medicine

## 2023-02-12 NOTE — Telephone Encounter (Signed)
Discussed with pt that the slot for December she was scheduled in has already been filled. She is requesting to be rescheduled with the first available provider. Let her know that Dr. Rhea Belton is out of the office but I would look to see what might be available.  Dr. Marina Goodell it looks like you may have a slot on 03/12/23. Would you be able to do a colon on this patient? She had a positive cologuard. Please advise.

## 2023-02-12 NOTE — Telephone Encounter (Signed)
Inbound call from patient stating that she wanted to reschedule her colonoscopy that she canceled for December with Dr. Terri Piedra. Patient stated that she is currently having "symptoms" and would like to schedule sooner than later. Requesting to discuss with nurse. Please advise.

## 2023-02-12 NOTE — Telephone Encounter (Signed)
Dr. Tomasa Rand please see notes below regarding [place] pt that needs to reschedule her Colonoscopy at the hospital. It looks like you have an opening on 12/19. Would you be able to do the colon for this pt? Please advise.

## 2023-02-12 NOTE — Telephone Encounter (Signed)
Not this time.  Sorry. I prefer to save those slots for my own outpatients or urgent biliary cases. Thanks for checking with me, Dr.  Marina Goodell

## 2023-02-18 NOTE — Telephone Encounter (Signed)
Dr. Rhea Belton this pt wanted to reschedule her procedure asap with whomever could do the procedure. I checked on 2 dates 1 with Marina Goodell and 1 with cunningham but they did not work.   There are openings for Marina Goodell 1/15, Gesser 1/16, armbruster 1/20, gessner 1/23. Please advise.

## 2023-02-18 NOTE — Telephone Encounter (Signed)
See if [facility] can accommodate on 12/18 or 12/19 with me This is my hospital week so I will be at [facility]

## 2023-02-18 NOTE — Telephone Encounter (Signed)
Spoke with pt and she states she can do the 18th or the 19th, will call her back tomorrow after speaking with charge nurse at [facility].  Pt also wanted to know if Dr. Rhea Belton was ordering the new FDA blood test by [facility] that tests for colon cancer. She is interested in this test also.Please advise.

## 2023-02-18 NOTE — Telephone Encounter (Signed)
Inbound call from patient requesting a call to discuss previous notes. Also requesting  a call to discuss a blood test. Please advise, thank you.

## 2023-02-18 NOTE — Telephone Encounter (Signed)
I have not ordered the shield DNA test to date I know it was recently approved and data published in the NEJM She has had a positive Cologuard is a colonoscopy is definitively recommended Her hernia may make it difficult and we could consider this shield  DNA test if colonoscopy was unable to be completed due to anatomy

## 2023-02-18 NOTE — Telephone Encounter (Signed)
Called [facility] and was told to call back in the am when the charge RN is there. [id number]

## 2023-02-19 NOTE — Telephone Encounter (Signed)
Spoke with pt and  colon is scheduled at [facility] 03/20/23 at 8:30am, pt to arrive there at 7am. Pt has prep already from previous appt. Will send updated instructions through mychart. Pt aware of Dr. Lauro Franklin recommendations/comments regarding the DNA blood test.

## 2023-02-20 NOTE — Telephone Encounter (Signed)
Prep instructions sent to patient

## 2023-02-24 ENCOUNTER — Other Ambulatory Visit (HOSPITAL_BASED_OUTPATIENT_CLINIC_OR_DEPARTMENT_OTHER): Payer: Self-pay

## 2023-03-03 ENCOUNTER — Ambulatory Visit (INDEPENDENT_AMBULATORY_CARE_PROVIDER_SITE_OTHER): Payer: 59 | Admitting: Internal Medicine

## 2023-03-03 ENCOUNTER — Other Ambulatory Visit (HOSPITAL_BASED_OUTPATIENT_CLINIC_OR_DEPARTMENT_OTHER): Payer: Self-pay

## 2023-03-13 ENCOUNTER — Encounter (HOSPITAL_COMMUNITY): Admission: RE | Payer: Self-pay | Source: Home / Self Care

## 2023-03-13 ENCOUNTER — Ambulatory Visit (HOSPITAL_COMMUNITY): Admission: RE | Admit: 2023-03-13 | Payer: 59 | Source: Home / Self Care | Admitting: Internal Medicine

## 2023-03-13 SURGERY — COLONOSCOPY WITH PROPOFOL
Anesthesia: Monitor Anesthesia Care

## 2023-03-20 ENCOUNTER — Ambulatory Visit (HOSPITAL_COMMUNITY)
Admission: RE | Admit: 2023-03-20 | Discharge: 2023-03-20 | Disposition: A | Discharge status: Home / Self Care | Payer: 59 | Attending: Internal Medicine | Admitting: Internal Medicine

## 2023-03-20 ENCOUNTER — Encounter (HOSPITAL_COMMUNITY)
Admission: RE | Disposition: A | Discharge status: Home / Self Care | Payer: Self-pay | Source: Home / Self Care | Attending: Internal Medicine

## 2023-03-20 ENCOUNTER — Ambulatory Visit (HOSPITAL_COMMUNITY): Payer: 59 | Admitting: Anesthesiology

## 2023-03-20 ENCOUNTER — Other Ambulatory Visit: Payer: Self-pay

## 2023-03-20 ENCOUNTER — Encounter (HOSPITAL_COMMUNITY): Payer: Self-pay | Admitting: Internal Medicine

## 2023-03-20 DIAGNOSIS — Z83719 Family history of colon polyps, unspecified: Secondary | ICD-10-CM | POA: Insufficient documentation

## 2023-03-20 DIAGNOSIS — Z1211 Encounter for screening for malignant neoplasm of colon: Secondary | ICD-10-CM | POA: Diagnosis not present

## 2023-03-20 DIAGNOSIS — Z6841 Body Mass Index (BMI) 40.0 and over, adult: Secondary | ICD-10-CM | POA: Diagnosis not present

## 2023-03-20 DIAGNOSIS — G4730 Sleep apnea, unspecified: Secondary | ICD-10-CM | POA: Diagnosis not present

## 2023-03-20 DIAGNOSIS — D128 Benign neoplasm of rectum: Secondary | ICD-10-CM | POA: Diagnosis not present

## 2023-03-20 DIAGNOSIS — I10 Essential (primary) hypertension: Secondary | ICD-10-CM | POA: Insufficient documentation

## 2023-03-20 DIAGNOSIS — F419 Anxiety disorder, unspecified: Secondary | ICD-10-CM | POA: Diagnosis not present

## 2023-03-20 DIAGNOSIS — D125 Benign neoplasm of sigmoid colon: Secondary | ICD-10-CM | POA: Diagnosis not present

## 2023-03-20 DIAGNOSIS — K621 Rectal polyp: Secondary | ICD-10-CM

## 2023-03-20 DIAGNOSIS — K648 Other hemorrhoids: Secondary | ICD-10-CM | POA: Insufficient documentation

## 2023-03-20 DIAGNOSIS — D123 Benign neoplasm of transverse colon: Secondary | ICD-10-CM | POA: Insufficient documentation

## 2023-03-20 DIAGNOSIS — K5730 Diverticulosis of large intestine without perforation or abscess without bleeding: Secondary | ICD-10-CM | POA: Diagnosis not present

## 2023-03-20 DIAGNOSIS — D124 Benign neoplasm of descending colon: Secondary | ICD-10-CM

## 2023-03-20 DIAGNOSIS — Z79899 Other long term (current) drug therapy: Secondary | ICD-10-CM | POA: Diagnosis not present

## 2023-03-20 DIAGNOSIS — Z800 Family history of malignant neoplasm of digestive organs: Secondary | ICD-10-CM | POA: Diagnosis not present

## 2023-03-20 DIAGNOSIS — F32A Depression, unspecified: Secondary | ICD-10-CM | POA: Insufficient documentation

## 2023-03-20 DIAGNOSIS — R195 Other fecal abnormalities: Secondary | ICD-10-CM

## 2023-03-20 DIAGNOSIS — K219 Gastro-esophageal reflux disease without esophagitis: Secondary | ICD-10-CM | POA: Insufficient documentation

## 2023-03-20 HISTORY — PX: COLONOSCOPY WITH PROPOFOL: SHX5780

## 2023-03-20 HISTORY — PX: POLYPECTOMY: SHX5525

## 2023-03-20 SURGERY — COLONOSCOPY WITH PROPOFOL
Anesthesia: Monitor Anesthesia Care

## 2023-03-20 MED ORDER — SODIUM CHLORIDE 0.9 % IV SOLN: INTRAVENOUS | Status: DC | PRN

## 2023-03-20 MED ORDER — EPHEDRINE SULFATE-NACL 50-0.9 MG/10ML-% IV SOSY
PREFILLED_SYRINGE | INTRAVENOUS | Status: DC | PRN
  Administered 2023-03-20: 5 mg via INTRAVENOUS

## 2023-03-20 MED ORDER — DEXMEDETOMIDINE HCL IN NACL 80 MCG/20ML IV SOLN
INTRAVENOUS | Status: DC | PRN
  Administered 2023-03-20: 8 ug via INTRAVENOUS

## 2023-03-20 MED ORDER — LIDOCAINE 2% (20 MG/ML) 5 ML SYRINGE
INTRAMUSCULAR | Status: DC | PRN
  Administered 2023-03-20: 40 mg via INTRAVENOUS

## 2023-03-20 MED ORDER — ONDANSETRON HCL 4 MG/2ML IJ SOLN
INTRAMUSCULAR | Status: DC | PRN
  Administered 2023-03-20: 4 mg via INTRAVENOUS

## 2023-03-20 MED ORDER — PROPOFOL 10 MG/ML IV BOLUS
INTRAVENOUS | Status: DC | PRN
  Administered 2023-03-20: 50 mg via INTRAVENOUS
  Administered 2023-03-20 (×4): 10 mg via INTRAVENOUS

## 2023-03-20 MED ORDER — SODIUM CHLORIDE 0.9 % IV SOLN: INTRAVENOUS | Status: DC

## 2023-03-20 MED ORDER — KETAMINE HCL 50 MG/5ML IJ SOSY
PREFILLED_SYRINGE | INTRAMUSCULAR | Status: AC
  Filled 2023-03-20: qty 5

## 2023-03-20 MED ORDER — KETAMINE HCL 10 MG/ML IJ SOLN
INTRAMUSCULAR | Status: DC | PRN
  Administered 2023-03-20: 30 mg via INTRAVENOUS
  Administered 2023-03-20 (×2): 10 mg via INTRAVENOUS

## 2023-03-20 MED ORDER — DEXMEDETOMIDINE HCL IN NACL 80 MCG/20ML IV SOLN
INTRAVENOUS | Status: AC
  Filled 2023-03-20: qty 20

## 2023-03-20 SURGICAL SUPPLY — 21 items

## 2023-03-20 NOTE — Anesthesia Postprocedure Evaluation (Signed)
Anesthesia Post Note  Patient: Chelsea Soto  Procedure(s) Performed: COLONOSCOPY WITH PROPOFOL POLYPECTOMY     Patient location during evaluation: PACU Anesthesia Type: MAC Level of consciousness: awake and alert Pain management: pain level controlled Vital Signs Assessment: post-procedure vital signs reviewed and stable Respiratory status: spontaneous breathing, nonlabored ventilation and respiratory function stable Cardiovascular status: blood pressure returned to baseline and stable Postop Assessment: no apparent nausea or vomiting Anesthetic complications: no   No notable events documented.  Last Vitals:  Vitals:   03/20/23 0950 03/20/23 1000  BP: (!) 112/51 119/73  Pulse: 71 67  Resp: 14 (!) 22  Temp:    SpO2: 94% 92%    Last Pain:  Vitals:   03/20/23 1000  TempSrc:   PainSc: 0-No pain                 Lannie Fields

## 2023-03-20 NOTE — Transfer of Care (Signed)
Immediate Anesthesia Transfer of Care Note  Patient: Chelsea Soto  Procedure(s) Performed: COLONOSCOPY WITH PROPOFOL POLYPECTOMY  Patient Location: PACU  Anesthesia Type:MAC  Level of Consciousness: awake, alert , and oriented  Airway & Oxygen Therapy: Patient Spontanous Breathing  Post-op Assessment: Report given to RN  Post vital signs: Reviewed and stable  Last Vitals:  Vitals Value Taken Time  BP 120/66   Temp    Pulse 73 03/20/23 0939  Resp 19 03/20/23 0939  SpO2 92 % 03/20/23 0939  Vitals shown include unfiled device data.  Last Pain:  Vitals:   03/20/23 0749  TempSrc: Temporal  PainSc: 0-No pain         Complications: No notable events documented.

## 2023-03-20 NOTE — Addendum Note (Signed)
Addendum  created 03/20/23 1115 by Ardean Larsen, CRNA   Flowsheet accepted

## 2023-03-20 NOTE — Op Note (Signed)
[facility] Patient Name: Chelsea Soto Procedure Date : 03/20/2023 MRN: [id number] Attending MD: Beverley Fiedler , MD, [id number] Date of Birth: April 03, 1965 CSN: [id number] Age: 57 Admit Type: Outpatient Procedure:                Colonoscopy Indications:              Family history of colon cancer in multiple                            second-degree relatives, Positive Cologuard test Providers:                Carie Caddy. Rhea Belton, MD, Fransisca Connors, Geoffery Lyons, Technician Referring MD:             Lina Sar. Falstreau Medicines:                Monitored Anesthesia Care Complications:            No immediate complications. Estimated Blood Loss:     Estimated blood loss was minimal. Procedure:                Pre-Anesthesia Assessment:                           - Prior to the procedure, a History and Physical                            was performed, and patient medications and                            allergies were reviewed. The patient's tolerance of                            previous anesthesia was also reviewed. The risks                            and benefits of the procedure and the sedation                            options and risks were discussed with the patient.                            All questions were answered, and informed consent                            was obtained. Prior Anticoagulants: The patient has                            taken no anticoagulant or antiplatelet agents. ASA                            Grade Assessment: III - A patient with severe  systemic disease. After reviewing the risks and                            benefits, the patient was deemed in satisfactory                            condition to undergo the procedure.                           After obtaining informed consent, the colonoscope                            was passed under direct vision. Throughout the                             procedure, the patient's blood pressure, pulse, and                            oxygen saturations were monitored continuously. The                            CF-HQ190L ([id number]) Olympus coloscope was                            introduced through the anus and advanced to the                            cecum, identified by appendiceal orifice and                            ileocecal valve. The colonoscopy was performed                            without difficulty (abdominal binder placed before                            scope introduced given large ventral hernia). The                            patient tolerated the procedure well. The quality                            of the bowel preparation was good. The ileocecal                            valve, appendiceal orifice, and rectum were                            photographed. Scope In: 9:02:15 AM Scope Out: 9:31:30 AM Scope Withdrawal Time: 0 hours 24 minutes 11 seconds  Total Procedure Duration: 0 hours 29 minutes 15 seconds  Findings:      The digital rectal exam was normal.      A 5 mm polyp was found in the transverse colon. The polyp was sessile.  The polyp was removed with a cold snare. Resection and retrieval were       complete.      A 4 mm polyp was found in the descending colon. The polyp was sessile.       The polyp was removed with a cold snare. Resection and retrieval were       complete.      Three sessile polyps were found in the sigmoid colon. The polyps were 3       to 8 mm in size. These polyps were removed with a cold snare. Resection       and retrieval were complete.      Two sessile polyps were found in the rectum. The polyps were 2 to 3 mm       in size. These polyps were removed with a cold snare. Resection and       retrieval were complete.      Multiple medium-mouthed and small-mouthed diverticula were found from       transverse colon to sigmoid colon.      Internal hemorrhoids were  found during retroflexion. The hemorrhoids       were small. Impression:               - One 5 mm polyp in the transverse colon, removed                            with a cold snare. Resected and retrieved.                           - One 4 mm polyp in the descending colon, removed                            with a cold snare. Resected and retrieved.                           - Three 3 to 8 mm polyps in the sigmoid colon,                            removed with a cold snare. Resected and retrieved.                           - Two 2 to 3 mm polyps in the rectum, removed with                            a cold snare. Resected and retrieved.                           - Moderate diverticulosis from transverse colon to                            sigmoid colon.                           - Small internal hemorrhoids. Moderate Sedation:      N/A Recommendation:           - Patient has a contact number available for  emergencies. The signs and symptoms of potential                            delayed complications were discussed with the                            patient. Return to normal activities tomorrow.                            Written discharge instructions were provided to the                            patient.                           - Resume previous diet.                           - Continue present medications.                           - Await pathology results.                           - Repeat colonoscopy is recommended for                            surveillance. The colonoscopy date will be                            determined after pathology results from today's                            exam become available for review. Procedure Code(s):        --- Professional ---                           [phone], Colonoscopy, flexible; with removal of                            tumor(s), polyp(s), or other lesion(s) by snare                             technique Diagnosis Code(s):        --- Professional ---                           D12.3, Benign neoplasm of transverse colon (hepatic                            flexure or splenic flexure)                           D12.4, Benign neoplasm of descending colon                           D12.5, Benign neoplasm of  sigmoid colon                           D12.8, Benign neoplasm of rectum                           K64.8, Other hemorrhoids                           Z80.0, Family history of malignant neoplasm of                            digestive organs                           R19.5, Other fecal abnormalities                           K57.30, Diverticulosis of large intestine without                            perforation or abscess without bleeding CPT copyright 2022 American Medical Association. All rights reserved. The codes documented in this report are preliminary and upon coder review may  be revised to meet current compliance requirements. Beverley Fiedler, MD 03/20/2023 9:44:33 AM This report has been signed electronically. Number of Addenda: 0

## 2023-03-20 NOTE — H&P (Signed)
GASTROENTEROLOGY PROCEDURE H&P NOTE   Primary Care Physician: Jettie Pagan, NP    Reason for Procedure:   + Cologuard, fam hx of colon cancer  Plan:    Colonoscopy   Patient is appropriate for endoscopic procedure(s) in the outpt hospital setting.  The nature of the procedure, as well as the risks, benefits, and alternatives were carefully and thoroughly reviewed with the patient. Ample time for discussion and questions allowed. The patient understood, was satisfied, and agreed to proceed.     HPI: Chelsea Soto is a 57 y.o. female who presents for colonoscopy.  Medical history as below.  Tolerated the prep.  No recent chest pain or shortness of breath.  No abdominal pain today.  Past Medical History:  Diagnosis Date   Anxiety    Aortic atherosclerosis (HCC)    Back pain    Constipation    Depression    Diverticulosis    Dyspnea    chronic (with exertion)   Edema of both lower extremities    GERD (gastroesophageal reflux disease)    Hiatal hernia    Hip pain    Hypercholesterolemia    Hypertension    Joint pain    Knee pain    Mood swings    Morbid obesity with BMI of 50.0-59.9, adult (HCC)    Post-menopausal bleeding    Prediabetes    Sleep apnea    Severe, on BiPAP   SOB (shortness of breath)    Umbilical hernia    Ventral hernia    Ventral hernia    Vitamin D deficiency    Wears contact lenses     Past Surgical History:  Procedure Laterality Date   COLONOSCOPY     DILATATION & CURETTAGE/HYSTEROSCOPY WITH MYOSURE N/A 01/21/2019   Procedure: DILATATION & CURETTAGE/HYSTEROSCOPY WITH possible  MYOSURE;  Surgeon: Zelphia Cairo, MD;  Location: [facility] OR;  Service: Gynecology;  Laterality: N/A;   LAPAROSCOPY N/A 09/14/2020   Procedure: DIAGNOSTIC LAPAROSCOPY;  Surgeon: Andria Meuse, MD;  Location: [facility] OR;  Service: General;  Laterality: N/A;   LYSIS OF ADHESION N/A 09/14/2020   Procedure: LYSIS OF ADHESION;  Surgeon: Andria Meuse,  MD;  Location: [facility] OR;  Service: General;  Laterality: N/A;   VENTRAL HERNIA REPAIR N/A 09/14/2020   Procedure: OPEN REPAIR OF INCARCERATED VENTRAL HERNIA  ADULT;  Surgeon: Andria Meuse, MD;  Location: [facility] OR;  Service: General;  Laterality: N/A;   WISDOM TOOTH EXTRACTION      Prior to Admission medications   Medication Sig Start Date End Date Taking? Authorizing Provider  acetaminophen (TYLENOL) 500 MG tablet Take 2 tablets (1,000 mg total) by mouth every 6 (six) hours. 09/15/20  Yes Simaan, Francine Graven, PA-C  ALPRAZolam Prudy Feeler) 0.5 MG tablet Take 1 tablet (0.5 mg total) by mouth 3 (three) times daily as needed for sleep or anxiety. 02/05/23  Yes   Cholecalciferol (VITAMIN D-3) 125 MCG (5000 UT) TABS Take 5,000 Units by mouth daily.   Yes [provider]  dicyclomine (BENTYL) 20 MG tablet Take 1 tablet (20 mg total) by mouth 2 (two) times daily. 09/12/20  Yes Caccavale, Sophia, PA-C  linaclotide (LINZESS) 290 MCG CAPS capsule Take 1 capsule (290 mcg total) by mouth daily. 12/16/22  Yes   lisinopril-hydrochlorothiazide (ZESTORETIC) 20-25 MG tablet Take 1 tablet by mouth daily.   Yes [provider]  lisinopril-hydrochlorothiazide (ZESTORETIC) 20-25 MG tablet Take one tablet by mouth daily. 12/16/22  Yes   bisacodyl 5  MG EC tablet Take 1 tablet (5 mg total) by mouth daily as needed for moderate constipation. 11/12/22   Worthy Rancher, MD  dicyclomine (BENTYL) 10 MG capsule Take 1 capsule (10 mg total) by mouth 4 (four) times daily as needed. 08/28/22     metroNIDAZOLE (METROCREAM) 0.75 % cream Apply 1 Application topically 2 (two) times daily to face 07/31/22     omeprazole (PRILOSEC) 40 MG capsule Take 1 capsule (40 mg total) by mouth daily. Patient taking differently: Take 40 mg by mouth daily. On hold 07/15/22     rosuvastatin (CRESTOR) 20 MG tablet Take 1 tablet (20 mg total) by mouth daily. Patient taking differently: Take 20 mg by mouth daily. On hold 05/27/22       No  current facility-administered medications for this encounter.    Allergies as of 02/19/2023   (No Known Allergies)    Family History  Problem Relation Age of Onset   Breast cancer Mother    Colon polyps Mother    Diabetes Mother    Hypertension Mother    Heart disease Mother    Cancer Mother    Depression Mother    Anxiety disorder Mother    Obesity Mother    CAD Father    Hypertension Father    Hyperlipidemia Father    Heart disease Father    Sleep apnea Father    Stroke Sister    Colon cancer Maternal Uncle    Colon cancer Maternal Grandmother    Esophageal cancer Neg Hx    Rectal cancer Neg Hx    Stomach cancer Neg Hx     Social History   Socioeconomic History   Marital status: Married    Spouse name: Dwayne   Number of children: Not on file   Years of education: Not on file   Highest education level: Not on file  Occupational History   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  Tobacco Use   Smoking status: Never   Smokeless tobacco: Never  Vaping Use   Vaping status: Never Used  Substance and Sexual Activity   Alcohol use: Yes    Comment: rare   Drug use: Never   Sexual activity: Yes  Other Topics Concern   Not on file  Social History Narrative   Not on file   Social Drivers of Health   Financial Resource Strain: Low Risk  (07/28/2022)   Received from [facility], [facility]   Overall Financial Resource Strain (CARDIA)    Difficulty of Paying Living Expenses: Not hard at all  Food Insecurity: No Food Insecurity (07/28/2022)   Received from [facility], [facility]   Hunger Vital Sign    Worried About Running Out of Food in the Last Year: Never true    Ran Out of Food in the Last Year: Never true  Transportation Needs: No Transportation Needs (07/28/2022)   Received from [facility], [facility]   PRAPARE - Transportation    Lack of Transportation (Medical): No    Lack of Transportation (Non-Medical): No  Physical Activity:  Unknown (07/28/2022)   Received from [facility]   Exercise Vital Sign    Days of Exercise per Week: 0 days    Minutes of Exercise per Session: Not on file  Recent Concern: Physical Activity - Inactive (07/28/2022)   Received from [facility], [facility]   Exercise Vital Sign    Days of Exercise per Week: 0 days    Minutes of Exercise per Session: 20  min  Stress: No Stress Concern Present (07/28/2022)   Received from [facility], [facility] - Occupational Stress Questionnaire    Feeling of Stress : Only a little  Social Connections: Moderately Integrated (07/28/2022)   Received from [facility], [facility]   Social Network    How would you rate your social network (family, work, friends)?: Adequate participation with social networks  Intimate Partner Violence: Not At Risk (07/28/2022)   Received from [facility], [facility]   HITS    Over the last 12 months how often did your partner physically hurt you?: Never    Over the last 12 months how often did your partner insult you or talk down to you?: Never    Over the last 12 months how often did your partner threaten you with physical harm?: Never    Over the last 12 months how often did your partner scream or curse at you?: Never    Physical Exam: Vital signs in last 24 hours: @BP  134/77   Pulse 73   Temp (!) 97.3 F (36.3 C) (Temporal)   Resp (!) 22   Ht 5\' 4"  (1.626 m)   Wt (!) 138.8 kg   SpO2 94%   BMI 52.52 kg/m  GEN: NAD EYE: Sclerae anicteric ENT: MMM CV: Non-tachycardic Pulm: CTA b/l GI: Soft, NT/ND, large ventral hernia NEURO:  Alert & Oriented x 3   Erick Blinks, MD [facility]  03/20/2023 8:44 AM

## 2023-03-20 NOTE — Discharge Instructions (Signed)

## 2023-03-20 NOTE — Anesthesia Preprocedure Evaluation (Addendum)
Anesthesia Evaluation  Patient identified by MRN, date of birth, ID band Patient awake    Reviewed: Allergy & Precautions, H&P , NPO status , Patient's Chart, lab work & pertinent test results  Airway Mallampati: IV  TM Distance: >3 FB Neck ROM: Full    Dental  (+) Teeth Intact, Dental Advisory Given   Pulmonary sleep apnea (bipap) and Continuous Positive Airway Pressure Ventilation    Pulmonary exam normal breath sounds clear to auscultation       Cardiovascular hypertension (134/77 preop), Pt. on medications Normal cardiovascular exam Rhythm:Regular Rate:Normal     Neuro/Psych  PSYCHIATRIC DISORDERS Anxiety Depression    negative neurological ROS     GI/Hepatic Neg liver ROS, hiatal hernia,GERD  Medicated and Controlled,,Positive cologuard   Endo/Other    Class 4 obesityBMI 54  Renal/GU negative Renal ROS  negative genitourinary   Musculoskeletal negative musculoskeletal ROS (+)    Abdominal  (+) + obese  Peds negative pediatric ROS (+)  Hematology negative hematology ROS (+)   Anesthesia Other Findings   Reproductive/Obstetrics negative OB ROS                             Anesthesia Physical Anesthesia Plan  ASA: 4  Anesthesia Plan: MAC   Post-op Pain Management:    Induction:   PONV Risk Score and Plan: 2 and Propofol infusion and TIVA  Airway Management Planned: Natural Airway and Simple Face Mask  Additional Equipment: None  Intra-op Plan:   Post-operative Plan:   Informed Consent: I have reviewed the patients History and Physical, chart, labs and discussed the procedure including the risks, benefits and alternatives for the proposed anesthesia with the patient or authorized representative who has indicated his/her understanding and acceptance.       Plan Discussed with: CRNA  Anesthesia Plan Comments:        Anesthesia Quick Evaluation

## 2023-03-21 ENCOUNTER — Encounter (HOSPITAL_COMMUNITY): Payer: Self-pay | Admitting: Internal Medicine

## 2023-03-25 LAB — SURGICAL PATHOLOGY

## 2023-03-28 ENCOUNTER — Other Ambulatory Visit (HOSPITAL_BASED_OUTPATIENT_CLINIC_OR_DEPARTMENT_OTHER): Payer: Self-pay

## 2023-03-30 ENCOUNTER — Encounter: Payer: Self-pay | Admitting: Internal Medicine

## 2023-03-31 ENCOUNTER — Other Ambulatory Visit (HOSPITAL_BASED_OUTPATIENT_CLINIC_OR_DEPARTMENT_OTHER): Payer: Self-pay

## 2023-03-31 MED ORDER — LINZESS 290 MCG PO CAPS
290.0000 ug | ORAL_CAPSULE | Freq: Every day | ORAL | 0 refills | Status: DC
  Filled 2023-03-31: qty 30, 30d supply, fill #0

## 2023-05-01 ENCOUNTER — Encounter (INDEPENDENT_AMBULATORY_CARE_PROVIDER_SITE_OTHER): Payer: Self-pay | Admitting: Internal Medicine

## 2023-05-01 ENCOUNTER — Ambulatory Visit (INDEPENDENT_AMBULATORY_CARE_PROVIDER_SITE_OTHER): Payer: 59 | Admitting: Internal Medicine

## 2023-05-01 ENCOUNTER — Other Ambulatory Visit (HOSPITAL_BASED_OUTPATIENT_CLINIC_OR_DEPARTMENT_OTHER): Payer: Self-pay

## 2023-05-01 VITALS — BP 120/78 | HR 74 | Temp 98.5°F | Ht 63.0 in | Wt 319.0 lb

## 2023-05-01 DIAGNOSIS — I10 Essential (primary) hypertension: Secondary | ICD-10-CM

## 2023-05-01 DIAGNOSIS — Z6841 Body Mass Index (BMI) 40.0 and over, adult: Secondary | ICD-10-CM

## 2023-05-01 DIAGNOSIS — G4733 Obstructive sleep apnea (adult) (pediatric): Secondary | ICD-10-CM | POA: Diagnosis not present

## 2023-05-01 DIAGNOSIS — R7303 Prediabetes: Secondary | ICD-10-CM

## 2023-05-01 DIAGNOSIS — E66813 Obesity, class 3: Secondary | ICD-10-CM | POA: Diagnosis not present

## 2023-05-01 MED ORDER — ZEPBOUND 2.5 MG/0.5ML ~~LOC~~ SOAJ
2.5000 mg | SUBCUTANEOUS | 0 refills | Status: DC
  Filled 2023-05-01 – 2023-06-12 (×5): qty 2, 28d supply, fill #0

## 2023-05-01 NOTE — Progress Notes (Signed)
Office: [phone]  /  Fax: [phone]  Weight Summary And Biometrics  Vitals Temp: 98.5 F (36.9 C) BP: 120/78 Pulse Rate: 74 SpO2: 97 %   Anthropometric Measurements Height: 5\' 3"  (1.6 m) Weight: (!) 319 lb (144.7 kg) BMI (Calculated): 56.52 Weight at Last Visit: 315 lb Weight Lost Since Last Visit: 0 lb Weight Gained Since Last Visit: 4 lb Starting Weight: 312 lb Total Weight Loss (lbs): 0 lb (0 kg) Peak Weight: 343 lb   Body Composition  Body Fat %: 56.1 % Fat Mass (lbs): 179 lbs Muscle Mass (lbs): 133 lbs Total Body Water (lbs): 97.4 lbs Visceral Fat Rating : 24    No data recorded Today's Visit #: 6  Starting Date: 08/06/22   Subjective   Chief Complaint: Obesity  Chelsea Soto is here to discuss her progress with her obesity treatment plan. She is on the the Category 2 Plan and states she is following her eating plan approximately 0 % of the time. She states she is not exercising.  Weight Progress Since Last Visit:   History of Present Illness   The patient presents for medical weight management.  She has been off [place] since her last visit in September, which was necessary before her colonoscopy in December. She experienced digestive issues while on Wegovy, including constipation and diarrhea, which have resolved since discontinuing the medication. She has not noticed significant changes in appetite since stopping Wegovy, although she feels slightly hungrier.  During the colonoscopy in December, seven precancerous polyps were found and removed. This was her first occurrence of polyps despite previous colonoscopies. She reports improved digestive symptoms after stopping Wegovy.  She has a history of sleep apnea and uses a BiPAP machine consistently. Her sleep apnea was previously severe, with an AHI of 97. She has not had a retest in five years but continues to use the BiPAP machine nightly.  She mentions mood swings and a tendency to eat when  stressed, particularly due to recent events. She is trying to be conscious of her eating habits and is exploring options to improve her nutrition and physical activity.  She is planning to go on a [place] cruise in May and has started walking to prepare for it. Her biggest barrier to nutrition is not planning meals, especially dinner, which she often prepares to appease her husband who does not have a weight problem. She is consistent with her breakfast routine, often having a Premier shake.      Challenges affecting patient progress: strong hunger signals and/or impaired satiety / inhibitory control, having difficulty with meal prep and planning, low volume of physical activity at present , and difficulty implementing reduced calorie nutrition plan.   Orexigenic Control: Reports problems with appetite and hunger signals.  Reports problems with satiety and satiation.  Denies problems with eating patterns and portion control.  Reports abnormal cravings. Denies feeling deprived or restricted.   Pharmacotherapy for weight management: She is currently taking no anti-obesity medication and was on Wegovy but discontinued medication in the fall of last year because of gastrointestinal side effects .   Assessment and Plan   Treatment Plan For Obesity:  Recommended Dietary Goals  Chelsea Soto is currently in the action stage of change. As such, her goal is to continue weight management plan. She has agreed to: follow a tailored, multi-day, low carbohydrate, high protein plan targeting 1500 cal calories and 100 grams of protein per day.  She was provided with a 7-day meal plan generated by AI  Behavioral Health and Counseling  We discussed the following behavioral modification strategies today: increasing lean protein intake to established goals, decreasing simple carbohydrates , increasing vegetables, increasing lower glycemic fruits, increasing fiber rich foods, avoiding skipping meals,  increasing water intake , work on meal planning and preparation, keeping healthy foods at home, decreasing eating out or consumption of processed foods, and making healthy choices when eating convenient foods, avoiding temptations and identifying enticing environmental cues, continue to work on implementation of reduced calorie nutritional plan, and planning for success.  Additional education and resources provided today: New meal plan  Recommended Physical Activity Goals  Chelsea Soto has been advised to work up to 150 minutes of moderate intensity aerobic activity a week and strengthening exercises 2-3 times per week for cardiovascular health, weight loss maintenance and preservation of muscle mass.   She has agreed to :  Think about enjoyable ways to increase daily physical activity and overcoming barriers to exercise and Increase physical activity in their day and reduce sedentary time (increase NEAT).  Pharmacotherapy  We discussed various medication options to help Chelsea Soto with her weight loss efforts and we both agreed to : start anti-obesity medication.  In addition to reduced calorie nutrition plan (RCNP), behavioral strategies and physical activity, Chelsea Soto would benefit from pharmacotherapy to assist with hunger signals, satiety and cravings. This will reduce obesity-related health risks by inducing weight loss, and help reduce food consumption and adherence to [facility]) . It may also improve QOL by improving self-confidence and reduce the  setbacks associated with metabolic adaptations.  She is also affected by high risk comorbidities associated with her weight which can improve with 10 to 15% of weight loss.  These include: Severe OSA, prediabetes, hypertension coronary artery calcifications.  After discussion of treatment options, mechanisms of action, benefits, side effects, contraindications and shared decision making she is agreeable to starting Zepbound 2.5 mg once a week. Patient also  made aware that medication is indicated for long-term management of obesity and the risk of weight regain following discontinuation of treatment and hence the importance of adhering to medical weight loss plan.  We demonstrated use of device and patient using teach back method was able to demonstrate proper technique.  Associated Conditions Impacted by Obesity Treatment  Primary hypertension  Prediabetes -     Zepbound; Inject 2.5 mg into the skin once a week.  Dispense: 2 mL; Refill: 0  Class 3 severe obesity with serious comorbidity and body mass index (BMI) of 50.0 to 59.9 in adult, unspecified obesity type (HCC) -     Zepbound; Inject 2.5 mg into the skin once a week.  Dispense: 2 mL; Refill: 0  OSA on CPAP - severe - AHI 90's -     Zepbound; Inject 2.5 mg into the skin once a week.  Dispense: 2 mL; Refill: 0       Obesity Presents for medical weight management after a period of absence since September. Discontinued Wegovy before December colonoscopy, which revealed seven precancerous polyps. Reports improved digestive symptoms post-discontinuation. Gained four pounds since stopping Wegovy. Motivated to lose weight before a [place] cruise in May. Discussed importance of calorie deficit and high protein intake to prevent sarcopenic obesity. Interested in [place], FDA approved for patients with moderate to severe sleep apnea, offering approximately 22% weight loss with adherence to diet and physical activity. - Initiate Zepbound, pending prior authorization - Provide a seven-day, 1500-calorie high protein meal plan using AI - Encourage physical activity, including low-impact exercises and aquatic  programs - Follow up in three weeks to assess progress and adjust medication dosage  Sleep Apnea Severe sleep apnea with an AHI of 97, on BiPAP for five years.  Reports good compliance. Zepbound may aid in weight management for patients with moderate to severe sleep apnea. -Start Zepbound  2.5 mg once a week -Continue BiPAP -Losing 15 to 20% of body weight may reduce severity of her sleep apnea  Hypertension Blood pressure is well-controlled on lisinopril hydrochlorothiazide.  Continue current regimen losing 10% of body weight may improve blood pressure control.  General Health Maintenance Colonoscopy in December revealed seven precancerous polyps, which were removed. Digestive issues related to [facility] resolved post-discontinuation. - Continue regular colonoscopy screenings as recommended by gastroenterologist  Follow-up - Schedule follow-up appointment in three weeks.       Objective   Physical Exam:  Blood pressure 120/78, pulse 74, temperature 98.5 F (36.9 C), height 5\' 3"  (1.6 m), weight (!) 319 lb (144.7 kg), SpO2 97%. Body mass index is 56.51 kg/m.  General: She is overweight, cooperative, alert, well developed, and in no acute distress. PSYCH: Has normal mood, affect and thought process.   HEENT: EOMI, sclerae are anicteric. Lungs: Normal breathing effort, no conversational dyspnea. Extremities: No edema.  Neurologic: No gross sensory or motor deficits. No tremors or fasciculations noted.    Diagnostic Data Reviewed:  BMET    Component Value Date/Time   NA 140 08/06/2022 1224   K 4.3 08/06/2022 1224   CL 99 08/06/2022 1224   CO2 22 08/06/2022 1224   GLUCOSE 109 (H) 08/06/2022 1224   GLUCOSE 107 (H) 08/02/2021 0719   BUN 13 08/06/2022 1224   CREATININE 0.54 (L) 08/06/2022 1224   CALCIUM 10.0 08/06/2022 1224   GFRNONAA >60 08/02/2021 0719   GFRAA >60 01/18/2019 1014   Lab Results  Component Value Date   HGBA1C 6.2 (H) 06/27/2020   Lab Results  Component Value Date   INSULIN 24.1 08/06/2022   INSULIN 19.4 06/27/2020   Lab Results  Component Value Date   TSH 3.380 06/27/2020   CBC    Component Value Date/Time   WBC 8.7 08/02/2021 0719   RBC 4.66 08/02/2021 0719   HGB 12.6 08/02/2021 0719   HGB 13.5 06/27/2020 1020   HCT 39.9  08/02/2021 0719   HCT 41.1 06/27/2020 1020   PLT 284 08/02/2021 0719   PLT 337 06/27/2020 1020   MCV 85.6 08/02/2021 0719   MCV 85 06/27/2020 1020   MCH 27.0 08/02/2021 0719   MCHC 31.6 08/02/2021 0719   RDW 14.3 08/02/2021 0719   RDW 13.8 06/27/2020 1020   Iron Studies No results found for: "IRON", "TIBC", "FERRITIN", "IRONPCTSAT" Lipid Panel     Component Value Date/Time   CHOL 176 08/06/2022 1224   TRIG 194 (H) 08/06/2022 1224   HDL 41 08/06/2022 1224   CHOLHDL 3.9 06/27/2020 1020   LDLCALC 101 (H) 08/06/2022 1224   Hepatic Function Panel     Component Value Date/Time   PROT 7.4 08/06/2022 1224   ALBUMIN 4.6 08/06/2022 1224   AST 15 08/06/2022 1224   ALT 19 08/06/2022 1224   ALKPHOS 120 08/06/2022 1224   BILITOT 0.5 08/06/2022 1224   BILIDIR <0.1 09/12/2020 1010   IBILI NOT CALCULATED 09/12/2020 1010      Component Value Date/Time   TSH 3.380 06/27/2020 1020   Nutritional Lab Results  Component Value Date   VD25OH 43.5 08/06/2022   VD25OH 53.3 06/27/2020    Follow-Up  Return in about 3 weeks (around 05/22/2023) for For Weight Mangement with Dr. Rikki Spearing.Marland Kitchen She was informed of the importance of frequent follow up visits to maximize her success with intensive lifestyle modifications for her multiple health conditions.  Attestation Statement   Reviewed by clinician on day of visit: allergies, medications, problem list, medical history, surgical history, family history, social history, and previous encounter notes.     Worthy Rancher, MD

## 2023-05-07 ENCOUNTER — Other Ambulatory Visit (HOSPITAL_BASED_OUTPATIENT_CLINIC_OR_DEPARTMENT_OTHER): Payer: Self-pay

## 2023-05-07 ENCOUNTER — Other Ambulatory Visit (HOSPITAL_COMMUNITY): Payer: Self-pay

## 2023-05-08 ENCOUNTER — Telehealth: Payer: Self-pay

## 2023-05-08 ENCOUNTER — Telehealth (INDEPENDENT_AMBULATORY_CARE_PROVIDER_SITE_OTHER): Payer: Self-pay | Admitting: Internal Medicine

## 2023-05-08 ENCOUNTER — Other Ambulatory Visit (HOSPITAL_BASED_OUTPATIENT_CLINIC_OR_DEPARTMENT_OTHER): Payer: Self-pay

## 2023-05-08 NOTE — Telephone Encounter (Addendum)
 Christy (patient's assistant) reports the [facility] pharmacy faxed something that needs to be filled out so the medication Zepbound  can be approved. She also stated that she faxed over a sleep study from [facility] to show the patient has sleep apnea so that it can be submitted to the patient's insurance so that the medication can be approved.

## 2023-05-08 NOTE — Telephone Encounter (Signed)
 PA submitted through Cover My Meds for Zepbound . Awaiting insurance determination. Key: Maple Seltzer

## 2023-05-12 NOTE — Telephone Encounter (Signed)
 Sleep study was sent in the PA, pt will be notified

## 2023-05-14 ENCOUNTER — Telehealth (INDEPENDENT_AMBULATORY_CARE_PROVIDER_SITE_OTHER): Payer: Self-pay | Admitting: Internal Medicine

## 2023-05-14 ENCOUNTER — Other Ambulatory Visit (HOSPITAL_BASED_OUTPATIENT_CLINIC_OR_DEPARTMENT_OTHER): Payer: Self-pay

## 2023-05-14 ENCOUNTER — Encounter (INDEPENDENT_AMBULATORY_CARE_PROVIDER_SITE_OTHER): Payer: Self-pay

## 2023-05-14 NOTE — Telephone Encounter (Signed)
Patient called in for an update on the status of her Zepbound prior authorization. Please follow up with the patient.

## 2023-05-15 ENCOUNTER — Other Ambulatory Visit (HOSPITAL_BASED_OUTPATIENT_CLINIC_OR_DEPARTMENT_OTHER): Payer: Self-pay

## 2023-05-19 ENCOUNTER — Other Ambulatory Visit (HOSPITAL_BASED_OUTPATIENT_CLINIC_OR_DEPARTMENT_OTHER): Payer: Self-pay

## 2023-05-20 ENCOUNTER — Ambulatory Visit (INDEPENDENT_AMBULATORY_CARE_PROVIDER_SITE_OTHER): Payer: 59 | Admitting: Internal Medicine

## 2023-05-20 ENCOUNTER — Other Ambulatory Visit (HOSPITAL_BASED_OUTPATIENT_CLINIC_OR_DEPARTMENT_OTHER): Payer: Self-pay

## 2023-05-20 MED ORDER — LISINOPRIL-HYDROCHLOROTHIAZIDE 20-25 MG PO TABS
1.0000 | ORAL_TABLET | Freq: Every day | ORAL | 0 refills | Status: DC
  Filled 2023-05-20: qty 30, 30d supply, fill #0
  Filled 2023-07-07: qty 30, 30d supply, fill #1
  Filled 2023-08-28: qty 30, 30d supply, fill #2

## 2023-06-12 ENCOUNTER — Other Ambulatory Visit (HOSPITAL_BASED_OUTPATIENT_CLINIC_OR_DEPARTMENT_OTHER): Payer: Self-pay

## 2023-06-16 ENCOUNTER — Ambulatory Visit (INDEPENDENT_AMBULATORY_CARE_PROVIDER_SITE_OTHER): Payer: 59 | Admitting: Internal Medicine

## 2023-06-18 ENCOUNTER — Encounter (INDEPENDENT_AMBULATORY_CARE_PROVIDER_SITE_OTHER): Payer: Self-pay

## 2023-07-14 ENCOUNTER — Other Ambulatory Visit: Payer: Self-pay | Admitting: Obstetrics and Gynecology

## 2023-07-14 DIAGNOSIS — R928 Other abnormal and inconclusive findings on diagnostic imaging of breast: Secondary | ICD-10-CM

## 2023-07-22 ENCOUNTER — Other Ambulatory Visit (HOSPITAL_BASED_OUTPATIENT_CLINIC_OR_DEPARTMENT_OTHER): Payer: Self-pay

## 2023-07-25 ENCOUNTER — Ambulatory Visit
Admission: RE | Admit: 2023-07-25 | Discharge: 2023-07-25 | Disposition: A | Discharge status: Home / Self Care | Source: Ambulatory Visit | Attending: Obstetrics and Gynecology | Admitting: Obstetrics and Gynecology

## 2023-07-25 ENCOUNTER — Other Ambulatory Visit: Payer: Self-pay | Admitting: Obstetrics and Gynecology

## 2023-07-25 DIAGNOSIS — R928 Other abnormal and inconclusive findings on diagnostic imaging of breast: Secondary | ICD-10-CM

## 2023-07-25 DIAGNOSIS — N6320 Unspecified lump in the left breast, unspecified quadrant: Secondary | ICD-10-CM

## 2023-07-28 ENCOUNTER — Other Ambulatory Visit (HOSPITAL_BASED_OUTPATIENT_CLINIC_OR_DEPARTMENT_OTHER): Payer: Self-pay

## 2023-08-26 ENCOUNTER — Ambulatory Visit
Admission: RE | Admit: 2023-08-26 | Discharge: 2023-08-26 | Disposition: A | Discharge status: Home / Self Care | Source: Ambulatory Visit | Attending: Obstetrics and Gynecology | Admitting: Obstetrics and Gynecology

## 2023-08-26 DIAGNOSIS — N6320 Unspecified lump in the left breast, unspecified quadrant: Secondary | ICD-10-CM

## 2023-08-26 HISTORY — PX: BREAST BIOPSY: SHX20

## 2023-08-27 LAB — SURGICAL PATHOLOGY

## 2023-08-28 ENCOUNTER — Telehealth: Payer: Self-pay | Admitting: *Deleted

## 2023-08-28 NOTE — Telephone Encounter (Signed)
 Spoke to patient to confirm upcoming morning [facility] clinic appointment on 6/4, paperwork will be sent via email.  Gave location and time, also informed patient that the surgeon's office would be calling as well to get information from them similar to the packet that they will be receiving so make sure to do both.  Reminded patient that all providers will be coming to the clinic to see them HERE and if they had any questions to not hesitate to reach back out to myself or their navigators.

## 2023-09-01 ENCOUNTER — Encounter: Payer: Self-pay | Admitting: *Deleted

## 2023-09-01 DIAGNOSIS — Z170 Estrogen receptor positive status [ER+]: Secondary | ICD-10-CM | POA: Insufficient documentation

## 2023-09-03 ENCOUNTER — Inpatient Hospital Stay: Attending: Hematology and Oncology | Admitting: Hematology and Oncology

## 2023-09-03 ENCOUNTER — Inpatient Hospital Stay

## 2023-09-03 ENCOUNTER — Inpatient Hospital Stay: Admitting: Licensed Clinical Social Worker

## 2023-09-03 ENCOUNTER — Ambulatory Visit
Admission: RE | Admit: 2023-09-03 | Discharge: 2023-09-03 | Disposition: A | Discharge status: Home / Self Care | Source: Ambulatory Visit | Attending: Radiation Oncology | Admitting: Radiation Oncology

## 2023-09-03 ENCOUNTER — Other Ambulatory Visit: Payer: Self-pay

## 2023-09-03 ENCOUNTER — Ambulatory Visit: Payer: Self-pay | Admitting: Surgery

## 2023-09-03 ENCOUNTER — Inpatient Hospital Stay: Admitting: Genetic Counselor

## 2023-09-03 ENCOUNTER — Encounter: Payer: Self-pay | Admitting: Hematology and Oncology

## 2023-09-03 ENCOUNTER — Ambulatory Visit: Attending: Surgery | Admitting: Physical Therapy

## 2023-09-03 ENCOUNTER — Encounter: Payer: Self-pay | Admitting: Physical Therapy

## 2023-09-03 VITALS — BP 140/67 | HR 70 | Temp 97.5°F | Resp 18 | Ht 63.0 in | Wt 325.7 lb

## 2023-09-03 DIAGNOSIS — Z170 Estrogen receptor positive status [ER+]: Secondary | ICD-10-CM | POA: Diagnosis not present

## 2023-09-03 DIAGNOSIS — Z83438 Family history of other disorder of lipoprotein metabolism and other lipidemia: Secondary | ICD-10-CM | POA: Diagnosis not present

## 2023-09-03 DIAGNOSIS — Z8249 Family history of ischemic heart disease and other diseases of the circulatory system: Secondary | ICD-10-CM | POA: Insufficient documentation

## 2023-09-03 DIAGNOSIS — Z800 Family history of malignant neoplasm of digestive organs: Secondary | ICD-10-CM | POA: Diagnosis not present

## 2023-09-03 DIAGNOSIS — Z825 Family history of asthma and other chronic lower respiratory diseases: Secondary | ICD-10-CM | POA: Insufficient documentation

## 2023-09-03 DIAGNOSIS — Z823 Family history of stroke: Secondary | ICD-10-CM | POA: Insufficient documentation

## 2023-09-03 DIAGNOSIS — Z1732 Human epidermal growth factor receptor 2 negative status: Secondary | ICD-10-CM | POA: Insufficient documentation

## 2023-09-03 DIAGNOSIS — I10 Essential (primary) hypertension: Secondary | ICD-10-CM | POA: Diagnosis not present

## 2023-09-03 DIAGNOSIS — Z79899 Other long term (current) drug therapy: Secondary | ICD-10-CM | POA: Insufficient documentation

## 2023-09-03 DIAGNOSIS — Z8349 Family history of other endocrine, nutritional and metabolic diseases: Secondary | ICD-10-CM | POA: Diagnosis not present

## 2023-09-03 DIAGNOSIS — R293 Abnormal posture: Secondary | ICD-10-CM | POA: Insufficient documentation

## 2023-09-03 DIAGNOSIS — C50212 Malignant neoplasm of upper-inner quadrant of left female breast: Secondary | ICD-10-CM

## 2023-09-03 DIAGNOSIS — Z809 Family history of malignant neoplasm, unspecified: Secondary | ICD-10-CM | POA: Diagnosis not present

## 2023-09-03 DIAGNOSIS — Z833 Family history of diabetes mellitus: Secondary | ICD-10-CM | POA: Insufficient documentation

## 2023-09-03 DIAGNOSIS — Z83719 Family history of colon polyps, unspecified: Secondary | ICD-10-CM | POA: Insufficient documentation

## 2023-09-03 DIAGNOSIS — Z803 Family history of malignant neoplasm of breast: Secondary | ICD-10-CM

## 2023-09-03 DIAGNOSIS — Z1721 Progesterone receptor positive status: Secondary | ICD-10-CM | POA: Diagnosis not present

## 2023-09-03 DIAGNOSIS — C50912 Malignant neoplasm of unspecified site of left female breast: Secondary | ICD-10-CM

## 2023-09-03 LAB — CMP (CANCER CENTER ONLY)
ALT: 11 U/L (ref 0–44)
AST: 12 U/L — ABNORMAL LOW (ref 15–41)
Albumin: 4.3 g/dL (ref 3.5–5.0)
Alkaline Phosphatase: 89 U/L (ref 38–126)
Anion gap: 7 (ref 5–15)
BUN: 16 mg/dL (ref 6–20)
CO2: 26 mmol/L (ref 22–32)
Calcium: 9.5 mg/dL (ref 8.9–10.3)
Chloride: 102 mmol/L (ref 98–111)
Creatinine: 0.49 mg/dL (ref 0.44–1.00)
GFR, Estimated: >60 mL/min (ref >60)
Glucose, Bld: 107 mg/dL — ABNORMAL HIGH (ref 70–99)
Potassium: 3.8 mmol/L (ref 3.5–5.1)
Sodium: 135 mmol/L (ref 135–145)
Total Bilirubin: 0.3 mg/dL (ref 0.0–1.2)
Total Protein: 7.9 g/dL (ref 6.5–8.1)

## 2023-09-03 LAB — CBC WITH DIFFERENTIAL (CANCER CENTER ONLY)
Abs Immature Granulocytes: 0.03 10*3/uL (ref 0.00–0.07)
Basophils Absolute: 0.1 10*3/uL (ref 0.0–0.1)
Basophils Relative: 1 %
Eosinophils Absolute: 0.2 10*3/uL (ref 0.0–0.5)
Eosinophils Relative: 2 %
HCT: 42.1 % (ref 36.0–46.0)
Hemoglobin: 13.8 g/dL (ref 12.0–15.0)
Immature Granulocytes: 0 %
Lymphocytes Relative: 39 %
Lymphs Abs: 3.2 10*3/uL (ref 0.7–4.0)
MCH: 27.7 pg (ref 26.0–34.0)
MCHC: 32.8 g/dL (ref 30.0–36.0)
MCV: 84.4 fL (ref 80.0–100.0)
Monocytes Absolute: 0.5 10*3/uL (ref 0.1–1.0)
Monocytes Relative: 6 %
Neutro Abs: 4.3 10*3/uL (ref 1.7–7.7)
Neutrophils Relative %: 52 %
Platelet Count: 326 10*3/uL (ref 150–400)
RBC: 4.99 MIL/uL (ref 3.87–5.11)
RDW: 14.5 % (ref 11.5–15.5)
WBC Count: 8.2 10*3/uL (ref 4.0–10.5)
nRBC: 0 % (ref 0.0–0.2)

## 2023-09-03 LAB — RESEARCH LABS

## 2023-09-03 LAB — GENETIC SCREENING ORDER

## 2023-09-03 NOTE — Research (Signed)
 [employer] 2021-05 - Specimen Collection Study to Evaluate Biomarkers in Subjects with Cancer    Patient Chelsea Soto was identified by Dr. Gudena as a potential candidate for the above listed study.  This Clinical Research Coordinator met with Chelsea Soto, Chelsea Soto, on 09/03/23 in a manner and location that ensures patient privacy to discuss participation in the above listed research study.  Patient is Accompanied by her son-in-law.  A copy of the informed consent document with embedded HIPAA language was provided to the patient.  Patient reads, speaks, and understands [place].    Patient was provided with the business card of this Coordinator and encouraged to contact the research team with any questions.  Patient was provided the option of taking informed consent documents home to review and was encouraged to review at their convenience with their support network, including other care providers. Patient is comfortable with making a decision regarding study participation today.  As outlined in the informed consent form, this Coordinator and Chelsea Soto discussed the purpose of the research study, the investigational nature of the study, study procedures and requirements for study participation, potential risks and benefits of study participation, as well as alternatives to participation. This study is not blinded. The patient understands participation is voluntary and they may withdraw from study participation at any time.  This study does not involve randomization.  This study does not involve an investigational drug or device. This study does not involve a placebo. Patient understands enrollment is pending full eligibility review.   Confidentiality and how the patient's information will be used as part of study participation were discussed.  Patient was informed there is reimbursement provided for their time and effort spent on trial participation.  The patient is encouraged  to discuss research study participation with their insurance provider to determine what costs they may incur as part of study participation, including research related injury.    All questions were answered to patient's satisfaction.  The informed consent with embedded HIPAA language was reviewed page by page.  The patient's mental and emotional status is appropriate to provide informed consent, and the patient verbalizes an understanding of study participation.  Patient has agreed to participate in the above listed research study and has voluntarily signed the informed consent version 14 Apr 2020 [Revised 30 Apr 2021] with embedded HIPAA language, version version 14 Apr 2020 [Revised 30 Apr 2021] on 09/03/23 at 11:20AM.  The patient was provided with a copy of the signed informed consent form with embedded HIPAA language for their reference.  No study specific procedures were obtained prior to the signing of the informed consent document.  Approximately 20 minutes were spent with the patient reviewing the informed consent documents.  Patient was not requested to complete a Release of Information form.   Eligibility: Eligibility criteria reviewed with patient. This nurse/coordinator has reviewed this patient's inclusion and exclusion criteria and confirmed patient is eligible for study participation. Eligibility confirmed by treating investigator, who also agrees that patient should proceed with enrollment. Patient will continue with enrollment.  Data Collection: Patient was interviewed to collect the following information.  Medical History:  High Blood Pressure  Yes Coronary Artery Disease No Lupus    No Rheumatoid Arthritis  No Diabetes   No      Lynch Syndrome  No  Is the patient currently taking a magnesium supplement?   No Does the patient have a personal history of cancer (greater than 5 years ago)?  No Does  the patient have a family history of cancer in 1st or 2nd degree relatives? Yes If  yes, Relationship(s) and Cancer type(s)?  Maternal Grandmother: colon cancer, breast cancer Mother: breast cancer Maternal Uncle: colon cancer  Does the patient have history of alcohol consumption? No    Does the patient have history of cigarette, cigar, pipe, or chewing tobacco use?  No   Blood Collection: Research blood obtained by fresh venipuncture. Patient tolerated well without any adverse events. However, only two full tubes were successfully collected. This was confirmed with the study team. Blood samples will be shipped today by research specialist Fidel Huddle.  Gift Card: [phone] gift card given to patient for her participation in this study.    The patient was thanked for her time and participation in the study.   Chelsea Soto, Ph.D. Clinical Research Coordinator [phone] 09/03/2023 1:48 PM

## 2023-09-03 NOTE — Addendum Note (Signed)
 Encounter addended by: Pearlene Bouchard, PA-C on: 09/03/2023 12:44 PM  Actions taken: Clinical Note Signed

## 2023-09-03 NOTE — Progress Notes (Signed)
 [facility] CONSULT NOTE  Patient Care Team: Tristan Furlough, NP as PCP - General (Nurse Practitioner) Auther Bo, RN as Oncology Nurse Navigator Alane Hsu, RN as Oncology Nurse Navigator Sim Dryer, MD as Consulting Physician (General Surgery) Cameron Cea, MD as Consulting Physician (Hematology and Oncology) Johna Myers, MD as Consulting Physician (Radiation Oncology)  CHIEF COMPLAINTS/PURPOSE OF CONSULTATION:  Newly diagnosed breast cancer  HISTORY OF PRESENTING ILLNESS: Chelsea Soto is a 58 year old right screening mammogram detected left breast mass at 11 o'clock position measuring 0.8 cm.  Ultrasound-guided biopsy revealed grade 2 IDC ER 100% PR 100% Ki67 2%, HER2 -1+ by IHC, axilla was negative.  Patient was presented this morning in the multidiscipline tumor board and she is here today accompanied by her family to discuss the treatment plan.   I reviewed her records extensively and collaborated the history with the patient.  SUMMARY OF ONCOLOGIC HISTORY: Oncology History  Malignant neoplasm of upper-inner quadrant of left breast in female, estrogen receptor positive (HCC)  08/26/2023 Initial Diagnosis   Screening mammogram detected left breast calcifications upper central 11 o'clock position 0.8 cm, ultrasound biopsy: Grade 2 IDC ER 100%, PR 100%, Ki67 2%, HER2 -1+ by IHC, axilla negative   09/03/2023 Cancer Staging   Staging form: Breast, AJCC 8th Edition - Clinical: Stage IA (cT1b, cN0, cM0, G2, ER+, PR+, HER2-) - Signed by Cameron Cea, MD on 09/03/2023 Stage prefix: Initial diagnosis Histologic grading system: 3 grade system      MEDICAL HISTORY:  Past Medical History:  Diagnosis Date   Anxiety    Aortic atherosclerosis (HCC)    Back pain    Breast cancer (HCC)    Constipation    Depression    Diverticulosis    Dyspnea    chronic (with exertion)   Edema of both lower extremities    GERD (gastroesophageal reflux disease)    Hiatal  hernia    Hip pain    Hypercholesterolemia    Hypertension    Joint pain    Knee pain    Mood swings    Morbid obesity with BMI of 50.0-59.9, adult (HCC)    Post-menopausal bleeding    Prediabetes    Sleep apnea    Severe, on BiPAP   SOB (shortness of breath)    Umbilical hernia    Ventral hernia    Ventral hernia    Vitamin D  deficiency    Wears contact lenses     SURGICAL HISTORY: Past Surgical History:  Procedure Laterality Date   BREAST BIOPSY Left 08/26/2023   US  LT BREAST BX W LOC DEV 1ST LESION IMG BX SPEC US  GUIDE 08/26/2023 [facility] MAMMOGRAPHY   COLONOSCOPY     COLONOSCOPY WITH PROPOFOL  N/A 03/20/2023   Procedure: COLONOSCOPY WITH PROPOFOL ;  Surgeon: Nannette Babe, MD;  Location: [facility] ENDOSCOPY;  Service: Gastroenterology;  Laterality: N/A;   DILATATION & CURETTAGE/HYSTEROSCOPY WITH MYOSURE N/A 01/21/2019   Procedure: DILATATION & CURETTAGE/HYSTEROSCOPY WITH possible  MYOSURE;  Surgeon: Ashby Lawman, MD;  Location[facility] OR;  Service: Gynecology;  Laterality: N/A;   LAPAROSCOPY N/A 09/14/2020   Procedure: DIAGNOSTIC LAPAROSCOPY;  Surgeon: Melvenia Stabs, MD;  Location: [facility] OR;  Service: General;  Laterality: N/A;   LYSIS OF ADHESION N/A 09/14/2020   Procedure: LYSIS OF ADHESION;  Surgeon: Melvenia Stabs, MD;  Location: [facility] OR;  Service: General;  Laterality: N/A;   POLYPECTOMY  03/20/2023   Procedure: POLYPECTOMY;  Surgeon: Nannette Babe, MD;  Location: [facility]  ENDOSCOPY;  Service: Gastroenterology;;   VENTRAL HERNIA REPAIR N/A 09/14/2020   Procedure: OPEN REPAIR OF INCARCERATED VENTRAL HERNIA  ADULT;  Surgeon: Melvenia Stabs, MD;  Location: [facility] OR;  Service: General;  Laterality: N/A;   WISDOM TOOTH EXTRACTION      SOCIAL HISTORY: Social History   Socioeconomic History   Marital status: Married    Spouse name: Dwayne   Number of children: Not on file   Years of education: Not on file   Highest education level: Not on file  Occupational History    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  Tobacco Use   Smoking status: Never   Smokeless tobacco: Never  Vaping Use   Vaping status: Never Used  Substance and Sexual Activity   Alcohol use: Yes    Comment: rare   Drug use: Never   Sexual activity: Yes  Other Topics Concern   Not on file  Social History Narrative   Not on file   Social Drivers of Health   Financial Resource Strain: Low Risk  (07/28/2022)   Received from [facility], [facility]   Overall Financial Resource Strain (CARDIA)    Difficulty of Paying Living Expenses: Not hard at all  Food Insecurity: No Food Insecurity (07/28/2022)   Received from [facility], [facility]   Hunger Vital Sign    Worried About Running Out of Food in the Last Year: Never true    Ran Out of Food in the Last Year: Never true  Transportation Needs: No Transportation Needs (07/28/2022)   Received from [facility], [facility]   PRAPARE - Transportation    Lack of Transportation (Medical): No    Lack of Transportation (Non-Medical): No  Physical Activity: Unknown (07/28/2022)   Received from [facility]   Exercise Vital Sign    Days of Exercise per Week: 0 days    Minutes of Exercise per Session: Not on file  Recent Concern: Physical Activity - Inactive (07/28/2022)   Received from [facility], [facility]   Exercise Vital Sign    Days of Exercise per Week: 0 days    Minutes of Exercise per Session: 20 min  Stress: No Stress Concern Present (07/28/2022)   Received from [facility], [facility] - Occupational Stress Questionnaire    Feeling of Stress : Only a little  Social Connections: Moderately Integrated (07/28/2022)   Received from [facility], [facility]   Social Network    How would you rate your social network (family, work, friends)?: Adequate participation with social networks  Intimate Partner Violence: Not At Risk (07/28/2022)   Received from  [facility], [facility]   HITS    Over the last 12 months how often did your partner physically hurt you?: Never    Over the last 12 months how often did your partner insult you or talk down to you?: Never    Over the last 12 months how often did your partner threaten you with physical harm?: Never    Over the last 12 months how often did your partner scream or curse at you?: Never    FAMILY HISTORY: Family History  Problem Relation Age of Onset   Breast cancer Mother    Colon polyps Mother    Diabetes Mother    Hypertension Mother    Heart disease Mother    Cancer Mother    Depression Mother    Anxiety disorder Mother    Obesity Mother  CAD Father    Hypertension Father    Hyperlipidemia Father    Heart disease Father    Sleep apnea Father    Stroke Sister    Colon cancer Maternal Uncle    Colon cancer Maternal Grandmother    Esophageal cancer Neg Hx    Rectal cancer Neg Hx    Stomach cancer Neg Hx     ALLERGIES:  has no known allergies.  MEDICATIONS:  Current Outpatient Medications  Medication Sig Dispense Refill   acetaminophen  (TYLENOL ) 500 MG tablet Take 2 tablets (1,000 mg total) by mouth every 6 (six) hours. 30 tablet 0   ALPRAZolam  (XANAX ) 0.5 MG tablet Take 1 tablet (0.5 mg total) by mouth 3 (three) times daily as needed for sleep or anxiety. 90 tablet 0   Cholecalciferol (VITAMIN D -3) 125 MCG (5000 UT) TABS Take 5,000 Units by mouth daily.     dicyclomine  (BENTYL ) 10 MG capsule Take 1 capsule (10 mg total) by mouth 4 (four) times daily as needed. 360 capsule 0   lisinopril -hydrochlorothiazide  (ZESTORETIC ) 20-25 MG tablet Take one tablet by mouth daily. 90 tablet 0   No current facility-administered medications for this visit.    REVIEW OF SYSTEMS:   Constitutional: Denies fevers, chills or abnormal night sweats Breast:  Denies any palpable lumps or discharge All other systems were reviewed with the patient and are negative.  PHYSICAL  EXAMINATION: ECOG PERFORMANCE STATUS: 1 - Symptomatic but completely ambulatory  Vitals:   09/03/23 0800  BP: (!) 140/67  Pulse: 70  Resp: 18  Temp: (!) 97.5 F (36.4 C)  SpO2: 97%   Filed Weights   09/03/23 0800  Weight: (!) 325 lb 11.2 oz (147.7 kg)    GENERAL:alert, no distress and comfortable    LABORATORY DATA:  I have reviewed the data as listed Lab Results  Component Value Date   WBC 8.7 08/02/2021   HGB 12.6 08/02/2021   HCT 39.9 08/02/2021   MCV 85.6 08/02/2021   PLT 284 08/02/2021   Lab Results  Component Value Date   NA 140 08/06/2022   K 4.3 08/06/2022   CL 99 08/06/2022   CO2 22 08/06/2022    RADIOGRAPHIC STUDIES: I have personally reviewed the radiological reports and agreed with the findings in the report.  ASSESSMENT AND PLAN:  Malignant neoplasm of upper-inner quadrant of left breast in female, estrogen receptor positive (HCC) 08/26/2023:Screening mammogram detected left breast calcifications upper central 11 o'clock position 0.8 cm, ultrasound biopsy: Grade 2 IDC ER 100%, PR 100%, Ki67 2%, HER2 -1+ by IHC, axilla negative  Pathology and radiology counseling:Discussed with the patient, the details of pathology including the type of breast cancer,the clinical staging, the significance of ER, PR and HER-2/neu receptors and the implications for treatment. After reviewing the pathology in detail, we proceeded to discuss the different treatment options between surgery, radiation, chemotherapy, antiestrogen therapies.  Recommendations: 1. Breast conserving surgery followed by 2. Oncotype DX testing to determine if chemotherapy would be of any benefit followed by 3. Adjuvant radiation therapy followed by 4. Adjuvant antiestrogen therapy  Oncotype counseling: I discussed Oncotype DX test. I explained to the patient that this is a 21 gene panel to evaluate patient tumors DNA to calculate recurrence score. This would help determine whether patient has high  risk or low risk breast cancer. She understands that if her tumor was found to be high risk, she would benefit from systemic chemotherapy. If low risk, no need of chemotherapy.  Return to  clinic after surgery to discuss final pathology report and then determine if Oncotype DX testing will need to be sent.  Total time for clinic visit, charting, coordination of care: 60 minutes   All questions were answered. The patient knows to call the clinic with any problems, questions or concerns.    Viinay K Denorris Reust, MD 09/03/23

## 2023-09-03 NOTE — Research (Signed)
 [employer] 2021-05 - Specimen Collection Study to Evaluate Biomarkers in Subjects with Cancer   This Nurse has reviewed this patient's inclusion and exclusion criteria as a second review and confirms Chelsea Soto is eligible for study participation.  Patient may continue with enrollment.  [name], BSN, RN, [employer] Research Nurse II [phone] 09/03/2023 11:38 AM

## 2023-09-03 NOTE — Assessment & Plan Note (Signed)
 08/26/2023:Screening mammogram detected left breast calcifications upper central 11 o'clock position 0.8 cm, ultrasound biopsy: Grade 2 IDC ER 100%, PR 100%, Ki67 2%, HER2 -1+ by IHC, axilla negative  Pathology and radiology counseling:Discussed with the patient, the details of pathology including the type of breast cancer,the clinical staging, the significance of ER, PR and HER-2/neu receptors and the implications for treatment. After reviewing the pathology in detail, we proceeded to discuss the different treatment options between surgery, radiation, chemotherapy, antiestrogen therapies.  Recommendations: 1. Breast conserving surgery followed by 2. Oncotype DX testing to determine if chemotherapy would be of any benefit followed by 3. Adjuvant radiation therapy followed by 4. Adjuvant antiestrogen therapy  Oncotype counseling: I discussed Oncotype DX test. I explained to the patient that this is a 21 gene panel to evaluate patient tumors DNA to calculate recurrence score. This would help determine whether patient has high risk or low risk breast cancer. She understands that if her tumor was found to be high risk, she would benefit from systemic chemotherapy. If low risk, no need of chemotherapy.  Return to clinic after surgery to discuss final pathology report and then determine if Oncotype DX testing will need to be sent.

## 2023-09-03 NOTE — Progress Notes (Cosign Needed Addendum)
 Radiation Oncology         [phone] ________________________________  Name: Chelsea Soto        MRN: [id number]  Date of Service: 09/03/2023 DOB: January 10, 1966  WU:[id number], Quillian Brunt, NP  Sim Dryer, MD     REFERRING PHYSICIAN: Sim Dryer, MD   DIAGNOSIS: The encounter diagnosis was Malignant neoplasm of upper-inner quadrant of left breast in female, estrogen receptor positive (HCC).  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 female seen in the multidisciplinary breast clinic for a new diagnosis of left breast cancer. The patient was noted to have asymmetry on screening mammogram. She proceeded with diagnostic mammogram and ultrasound on 07/25/2023 that showed a 0.8 cm mass at the 11:00 position.  No abnormal left axilla lymph nodes were appreciated.  Accordingly, patient underwent a biopsy on 08/26/2023 that revealed grade 2 invasive ductal carcinoma that was ER and PR positive and HER2 negative with a Ki-67 2%.   She is seen today to discuss treatment recommendations of her cancer.      PREVIOUS RADIATION THERAPY: No   PAST MEDICAL HISTORY:  Past Medical History:  Diagnosis Date   Anxiety    Aortic atherosclerosis (HCC)    Back pain    Breast cancer (HCC)    Constipation    Depression    Diverticulosis    Dyspnea    chronic (with exertion)   Edema of both lower extremities    GERD (gastroesophageal reflux disease)    Hiatal hernia    Hip pain    Hypercholesterolemia    Hypertension    Joint pain    Knee pain    Mood swings    Morbid obesity with BMI of 50.0-59.9, adult (HCC)    Post-menopausal bleeding    Prediabetes    Sleep apnea    Severe, on BiPAP   SOB (shortness of breath)    Umbilical hernia    Ventral hernia    Ventral hernia    Vitamin D  deficiency    Wears contact lenses        PAST SURGICAL HISTORY: Past  Surgical History:  Procedure Laterality Date   BREAST BIOPSY Left 08/26/2023   US  LT BREAST BX W LOC DEV 1ST LESION IMG BX SPEC US  GUIDE 08/26/2023 [facility] MAMMOGRAPHY   COLONOSCOPY     COLONOSCOPY WITH PROPOFOL  N/A 03/20/2023   Procedure: COLONOSCOPY WITH PROPOFOL ;  Surgeon: Nannette Babe, MD;  Location: [facility] ENDOSCOPY;  Service: Gastroenterology;  Laterality: N/A;   DILATATION & CURETTAGE/HYSTEROSCOPY WITH MYOSURE N/A 01/21/2019   Procedure: DILATATION & CURETTAGE/HYSTEROSCOPY WITH possible  MYOSURE;  Surgeon: Ashby Lawman, MD;  Location: [facility] OR;  Service: Gynecology;  Laterality: N/A;   LAPAROSCOPY N/A 09/14/2020   Procedure: DIAGNOSTIC LAPAROSCOPY;  Surgeon: Melvenia Stabs, MD;  Location: [facility] OR;  Service: General;  Laterality: N/A;   LYSIS OF ADHESION N/A 09/14/2020   Procedure: LYSIS OF ADHESION;  Surgeon: Melvenia Stabs, MD;  Location: [facility] OR;  Service: General;  Laterality: N/A;   POLYPECTOMY  03/20/2023   Procedure: POLYPECTOMY;  Surgeon: Nannette Babe, MD;  Location: [facility] ENDOSCOPY;  Service: Gastroenterology;;   VENTRAL HERNIA REPAIR N/A 09/14/2020   Procedure: OPEN REPAIR OF INCARCERATED VENTRAL HERNIA  ADULT;  Surgeon: Melvenia Stabs, MD;  Location: [facility] OR;  Service: General;  Laterality: N/A;   WISDOM TOOTH EXTRACTION  FAMILY HISTORY:  Family History  Problem Relation Age of Onset   Breast cancer Mother    Colon polyps Mother    Diabetes Mother    Hypertension Mother    Heart disease Mother    Cancer Mother    Depression Mother    Anxiety disorder Mother    Obesity Mother    CAD Father    Hypertension Father    Hyperlipidemia Father    Heart disease Father    Sleep apnea Father    Stroke Sister    Colon cancer Maternal Uncle    Colon cancer Maternal Grandmother    Esophageal cancer Neg Hx    Rectal cancer Neg Hx    Stomach cancer Neg Hx      SOCIAL HISTORY:  reports that she has never smoked. She has never used smokeless tobacco. She  reports current alcohol use. She reports that she does not use drugs.   ALLERGIES: Patient has no known allergies.   MEDICATIONS:  Current Outpatient Medications  Medication Sig Dispense Refill   acetaminophen  (TYLENOL ) 500 MG tablet Take 2 tablets (1,000 mg total) by mouth every 6 (six) hours. 30 tablet 0   ALPRAZolam  (XANAX ) 0.5 MG tablet Take 1 tablet (0.5 mg total) by mouth 3 (three) times daily as needed for sleep or anxiety. 90 tablet 0   Cholecalciferol (VITAMIN D -3) 125 MCG (5000 UT) TABS Take 5,000 Units by mouth daily.     dicyclomine  (BENTYL ) 10 MG capsule Take 1 capsule (10 mg total) by mouth 4 (four) times daily as needed. 360 capsule 0   lisinopril -hydrochlorothiazide  (ZESTORETIC ) 20-25 MG tablet Take one tablet by mouth daily. 90 tablet 0   No current facility-administered medications for this encounter.     REVIEW OF SYSTEMS: On review of systems, the patient reports that she is doing well overall. No breast specific complaints are verbalized.        PHYSICAL EXAM:  Wt Readings from Last 3 Encounters:  09/03/23 (!) 325 lb 11.2 oz (147.7 kg)  05/01/23 (!) 319 lb (144.7 kg)  03/20/23 (!) 306 lb (138.8 kg)   Temp Readings from Last 3 Encounters:  09/03/23 (!) 97.5 F (36.4 C) (Temporal)  05/01/23 98.5 F (36.9 C)  03/20/23 97.6 F (36.4 C) (Temporal)   BP Readings from Last 3 Encounters:  09/03/23 (!) 140/67  05/01/23 120/78  03/20/23 119/73   Pulse Readings from Last 3 Encounters:  09/03/23 70  05/01/23 74  03/20/23 67    In general this is a well appearing female in no acute distress. She's alert and oriented x4 and appropriate throughout the examination. Cardiopulmonary assessment is negative for acute distress and she exhibits normal effort. Bilateral breast exam is deferred.    ECOG = 0  0 - Asymptomatic (Fully active, able to carry on all predisease activities without restriction)  1 - Symptomatic but completely ambulatory (Restricted in  physically strenuous activity but ambulatory and able to carry out work of a light or sedentary nature. For example, light housework, office work)  2 - Symptomatic, <50% in bed during the day (Ambulatory and capable of all self care but unable to carry out any work activities. Up and about more than 50% of waking hours)  3 - Symptomatic, >50% in bed, but not bedbound (Capable of only limited self-care, confined to bed or chair 50% or more of waking hours)  4 - Bedbound (Completely disabled. Cannot carry on any self-care. Totally confined to bed or chair)  5 - Death   Aurea Blossom MM, Creech RH, Tormey DC, et al. [phone]). "Toxicity and response criteria of the [facility]". Am. Hillard Lowes. Oncol. 5 (6): 649-55    LABORATORY DATA:  Lab Results  Component Value Date   WBC 8.7 08/02/2021   HGB 12.6 08/02/2021   HCT 39.9 08/02/2021   MCV 85.6 08/02/2021   PLT 284 08/02/2021   Lab Results  Component Value Date   NA 140 08/06/2022   K 4.3 08/06/2022   CL 99 08/06/2022   CO2 22 08/06/2022   Lab Results  Component Value Date   ALT 19 08/06/2022   AST 15 08/06/2022   ALKPHOS 120 08/06/2022   BILITOT 0.5 08/06/2022      RADIOGRAPHY: US  LT BREAST BX W LOC DEV 1ST LESION IMG BX SPEC US  GUIDE Addendum Date: 08/27/2023 ADDENDUM REPORT: 08/27/2023 12:09 ADDENDUM: Pathology revealed GRADE II INVASIVE MAMMARY CARCINOMA, NO SPECIAL TYPE (DUCTAL) of the LEFT breast, 11 o'clock, 7 cmfn, (ribbon clip). This was found to be concordant by Dr. Roda Cirri. Pathology results were discussed with the patient by telephone. The patient reported doing well after the biopsy with tenderness at the site. Post biopsy instructions and care were reviewed and questions were answered. The patient was encouraged to call [facility] for any additional concerns. My direct phone number was provided. The patient was referred to [facility] at  [facility] on September 03, 2023. Pathology results reported by Kraig Peru, RN on 08/27/2023. Electronically Signed   By: Roda Cirri M.D.   On: 08/27/2023 12:09   Result Date: 08/27/2023 CLINICAL DATA:  Patient presents for ultrasound-guided core needle biopsy of an 8 mm irregular mass over the 11 o'clock position of the left breast 7 cm from the nipple. EXAM: ULTRASOUND GUIDED LEFT BREAST CORE NEEDLE BIOPSY COMPARISON:  Previous exam(s). PROCEDURE: I met with the patient and we discussed the procedure of ultrasound-guided biopsy, including benefits and alternatives. We discussed the high likelihood of a successful procedure. We discussed the risks of the procedure, including infection, bleeding, tissue injury, clip migration, and inadequate sampling. Informed written consent was given. The usual time-out protocol was performed immediately prior to the procedure. Lesion quadrant: Left upper inner quadrant. Using sterile technique and 1% Lidocaine  as local anesthetic, under direct ultrasound visualization, a 12 gauge spring-loaded device was used to perform biopsy of the targeted 8 mm mass over the 11 o'clock position of the left breast 7 cm from the nipple using a lateral to medial approach. At the conclusion of the procedure a ribbon shaped tissue marker clip was deployed into the biopsy cavity. Follow up 2 view mammogram was performed and dictated separately. IMPRESSION: Ultrasound guided biopsy of a suspicious left breast mass. No apparent complications. Electronically Signed: By: Roda Cirri M.D. On: 08/26/2023 10:31   MM CLIP PLACEMENT LEFT Result Date: 08/26/2023 CLINICAL DATA:  Patient is post ultrasound-guided core needle biopsy of an 8 mm irregular mass over the 11 o'clock position of the left breast 7 cm from the nipple. EXAM: 3D DIAGNOSTIC LEFT MAMMOGRAM POST ULTRASOUND BIOPSY COMPARISON:  Previous exam(s). ACR Breast Density Category a: The breasts are almost entirely fatty.  FINDINGS: 3D Mammographic images were obtained following ultrasound guided biopsy of the targeted 8 mm mass over the 11 o'clock position of the left breast. The biopsy marking clip is in expected position at the site of biopsy. IMPRESSION: Appropriate positioning  of the ribbon shaped biopsy marking clip at the site of biopsy in the 11 o'clock position of the left breast. Final Assessment: Post Procedure Mammograms for Marker Placement Electronically Signed   By: Roda Cirri M.D.   On: 08/26/2023 10:38       IMPRESSION/PLAN: 1. 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 Jeryl Moris discussed the pathology findings and reviewed the nature of early-stage breast cancer. The consensus from the breast conference includes lumpectomy, Oncotype testing, adjuvant radiation, and antiestrogens. Dr. Jeryl Moris recommends external beam radiotherapy to the breast following her lumpectomy to reduce risks of local recurrence followed by antiestrogen therapy.  She understands that chemotherapy would precede radiation, if recommended. We discussed the risks, benefits, short, and long term effects of radiotherapy, as well as the curative intent, and the patient is interested in proceeding. Dr. Jeryl Moris discussed the delivery and logistics of radiotherapy and anticipates a course of 4 weeks of radiotherapy to the left breast with deep inspiration breath hold technique. We will see her back a few weeks after surgery to discuss the simulation process and anticipate starting radiotherapy about 4-6 weeks after surgery.   2. Possible genetic predisposition to malignancy. The patient is a candidate for genetic testing given her personal and family history. She will meet with our geneticist today in clinic.   In a visit lasting 60 minutes, greater than 50% of the time was spent face to face reviewing her case, as well as in preparation of, discussing, and coordinating the patient's  care.  The above documentation reflects my direct findings during this shared patient visit. Please see the separate note by Dr. Jeryl Moris on this date for the remainder of the patient's plan of care.    Amiel Kalata, [place]    **Disclaimer: This note was dictated with voice recognition software. Similar sounding words can inadvertently be transcribed and this note may contain transcription errors which may not have been corrected upon publication of note.**

## 2023-09-03 NOTE — Therapy (Signed)
 OUTPATIENT PHYSICAL THERAPY BREAST CANCER BASELINE EVALUATION   Patient Name: Chelsea Soto MRN: [id number] DOB:1965/10/17, 58 y.o., female Today's Date: 09/03/2023  END OF SESSION:  PT End of Session - 09/03/23 1104     Visit Number 1    Number of Visits 2    Date for PT Re-Evaluation 10/29/23    PT Start Time 1001    PT Stop Time 1016   Also saw pt from 1039-1100 for a total of 36 min   PT Time Calculation (min) 15 min    Activity Tolerance Patient tolerated treatment well    Behavior During Therapy WFL for tasks assessed/performed             Past Medical History:  Diagnosis Date   Anxiety    Aortic atherosclerosis (HCC)    Back pain    Breast cancer (HCC)    Constipation    Depression    Diverticulosis    Dyspnea    chronic (with exertion)   Edema of both lower extremities    GERD (gastroesophageal reflux disease)    Hiatal hernia    Hip pain    Hypercholesterolemia    Hypertension    Joint pain    Knee pain    Mood swings    Morbid obesity with BMI of 50.0-59.9, adult (HCC)    Post-menopausal bleeding    Prediabetes    Sleep apnea    Severe, on BiPAP   SOB (shortness of breath)    Umbilical hernia    Ventral hernia    Ventral hernia    Vitamin D  deficiency    Wears contact lenses    Past Surgical History:  Procedure Laterality Date   BREAST BIOPSY Left 08/26/2023   US  LT BREAST BX W LOC DEV 1ST LESION IMG BX SPEC US  GUIDE 08/26/2023 [facility] MAMMOGRAPHY   COLONOSCOPY     COLONOSCOPY WITH PROPOFOL  N/A 03/20/2023   Procedure: COLONOSCOPY WITH PROPOFOL ;  Surgeon: Nannette Babe, MD;  Location: [facility] ENDOSCOPY;  Service: Gastroenterology;  Laterality: N/A;   DILATATION & CURETTAGE/HYSTEROSCOPY WITH MYOSURE N/A 01/21/2019   Procedure: DILATATION & CURETTAGE/HYSTEROSCOPY WITH possible  MYOSURE;  Surgeon: Ashby Lawman, MD;  Location: [facility] OR;  Service: Gynecology;  Laterality: N/A;   LAPAROSCOPY N/A 09/14/2020   Procedure: DIAGNOSTIC LAPAROSCOPY;   Surgeon: Melvenia Stabs, MD;  Location: [facility] OR;  Service: General;  Laterality: N/A;   LYSIS OF ADHESION N/A 09/14/2020   Procedure: LYSIS OF ADHESION;  Surgeon: Melvenia Stabs, MD;  Location: [facility] OR;  Service: General;  Laterality: N/A;   POLYPECTOMY  03/20/2023   Procedure: POLYPECTOMY;  Surgeon: Nannette Babe, MD;  Location: [facility] ENDOSCOPY;  Service: Gastroenterology;;   VENTRAL HERNIA REPAIR N/A 09/14/2020   Procedure: OPEN REPAIR OF INCARCERATED VENTRAL HERNIA  ADULT;  Surgeon: Melvenia Stabs, MD;  Location: [facility] OR;  Service: General;  Laterality: N/A;   WISDOM TOOTH EXTRACTION     Patient Active Problem List   Diagnosis Date Noted   Malignant neoplasm of upper-inner quadrant of left breast in female, estrogen receptor positive (HCC) 09/01/2023   OSA on CPAP 05/01/2023   Positive colorectal cancer screening using Cologuard test 03/20/2023   Benign neoplasm of transverse colon 03/20/2023   Benign neoplasm of descending colon 03/20/2023   Benign neoplasm of sigmoid colon 03/20/2023   Benign neoplasm of rectum 03/20/2023   Constipation 11/12/2022   Coronary artery calcification seen on CAT scan 08/06/2022   Mixed hyperlipidemia 08/06/2022  Primary hypertension 08/06/2022   Class 3 severe obesity with serious comorbidity and body mass index (BMI) of 50.0 to 59.9 in adult 08/06/2022   Prediabetes 08/06/2022   Other fatigue 08/06/2022   SOB (shortness of breath) on exertion 08/06/2022   Incarcerated hernia 09/14/2020   REFERRING PROVIDER: Dr. Sim Dryer  REFERRING DIAG: Left breast cancer  THERAPY DIAG:  Malignant neoplasm of upper-inner quadrant of left breast in female, estrogen receptor positive (HCC)  Abnormal posture  Rationale for Evaluation and Treatment: Rehabilitation  ONSET DATE: 07/08/2023  SUBJECTIVE:                                                                                                                                                                                            SUBJECTIVE STATEMENT: Patient reports she is here today to be seen by her medical team for her newly diagnosed left breast cancer.   PERTINENT HISTORY:  Patient was diagnosed on 07/08/2023 with left grade 2 invasive ductal carcinoma breast cancer. It measures 8 mm and is located in the upper inner quadrant. It is ER/PR positive and HER2 negative with a Ki67 of 2%.   PATIENT GOALS:   reduce lymphedema risk and learn post op HEP.   PAIN:  Are you having pain? Yes: NPRS scale: 5-6/10 Pain location: abdomen Pain description: sore Aggravating factors: unknown Relieving factors: unknown  PRECAUTIONS: Active CA   RED FLAGS: None   HAND DOMINANCE: right  WEIGHT BEARING RESTRICTIONS: No  FALLS:  Has patient fallen in last 6 months? No  LIVING ENVIRONMENT: Patient lives with: her husband Lives in: House/apartment Has following equipment at home: None  OCCUPATION: Owns an Visual merchandiser  LEISURE: She does not exercise  PRIOR LEVEL OF FUNCTION: Independent   OBJECTIVE: Note: Objective measures were completed at Evaluation unless otherwise noted.  COGNITION: Overall cognitive status: Within functional limits for tasks assessed    POSTURE:  Forward head and rounded shoulders posture  UPPER EXTREMITY AROM/PROM:  A/PROM RIGHT   eval   Shoulder extension 55  Shoulder flexion 138  Shoulder abduction 137  Shoulder internal rotation 77  Shoulder external rotation 78    (Blank rows = not tested)  A/PROM LEFT   eval  Shoulder extension 63  Shoulder flexion 123  Shoulder abduction 145  Shoulder internal rotation 87  Shoulder external rotation 78    (Blank rows = not tested)  CERVICAL AROM: All within normal limits  UPPER EXTREMITY STRENGTH: WNL  LYMPHEDEMA ASSESSMENTS (in cm):   LANDMARK RIGHT   eval  10 cm proximal to olecranon process 38.3  Olecranon process 28.7  10  cm proximal to ulnar styloid process  26.3  Just proximal to ulnar styloid process 16.6  Across hand at thumb web space 19.6  At base of 2nd digit 6.5  (Blank rows = not tested)  LANDMARK LEFT   eval  10 cm proximal to olecranon process 40.1  Olecranon process 28.9  10 cm proximal to ulnar styloid process 25.6  Just proximal to ulnar styloid process 17  Across hand at thumb web space 19.5  At base of 2nd digit 6.6  (Blank rows = not tested)  L-DEX LYMPHEDEMA SCREENING:  The patient was assessed using the L-Dex machine today to produce a lymphedema index baseline score. The patient will be reassessed on a regular basis (typically every 3 months) to obtain new L-Dex scores. If the score is > 6.5 points away from his/her baseline score indicating onset of subclinical lymphedema, it will be recommended to wear a compression garment for 4 weeks, 12 hours per day and then be reassessed. If the score continues to be > 6.5 points from baseline at reassessment, we will initiate lymphedema treatment. Assessing in this manner has a 95% rate of preventing clinically significant lymphedema.   L-DEX FLOWSHEETS - 09/03/23 1100       L-DEX LYMPHEDEMA SCREENING   Measurement Type Unilateral    L-DEX MEASUREMENT EXTREMITY Upper Extremity    POSITION  Standing    DOMINANT SIDE Right    At Risk Side Left    BASELINE SCORE (UNILATERAL) 6             QUICK DASH SURVEY:  Cindia Crease - 09/03/23 0001     Open a tight or new jar Mild difficulty    Do heavy household chores (wash walls, wash floors) Moderate difficulty    Carry a shopping bag or briefcase No difficulty    Wash your back Moderate difficulty    Use a knife to cut food No difficulty    Recreational activities in which you take some force or impact through your arm, shoulder, or hand (golf, hammering, tennis) Mild difficulty    During the past week, to what extent has your arm, shoulder or hand problem interfered with your normal social activities with family, friends,  neighbors, or groups? Slightly    During the past week, to what extent has your arm, shoulder or hand problem limited your work or other regular daily activities Slightly    Arm, shoulder, or hand pain. Mild    Tingling (pins and needles) in your arm, shoulder, or hand None    Difficulty Sleeping No difficulty    DASH Score 20.45 %              PATIENT EDUCATION:  Education details: Time spent educating patient on aspects of self-care to maximize post op recovery. Patient was educated on where and how to get a post op compression bra to use to reduce post op edema. Patient was also educated on the use of SOZO screenings and surveillance principles for early identification of lymphedema onset. She was instructed to use the post op pillow in the axilla for pressure and pain relief. Patient educated on lymphedema risk reduction and post op shoulder/posture HEP. Person educated: Patient Education method: Explanation, Demonstration, Handout Education comprehension: Patient verbalized understanding and returned demonstration  HOME EXERCISE PROGRAM: Patient was instructed today in a home exercise program today for post op shoulder range of motion. These included active assist shoulder flexion in sitting, scapular retraction, wall walking with shoulder abduction,  and hands behind head external rotation.  She was encouraged to do these twice a day, holding 3 seconds and repeating 5 times when permitted by her physician.   ASSESSMENT:  CLINICAL IMPRESSION: Patient was diagnosed on 07/08/2023 with left grade 2 invasive ductal carcinoma breast cancer. It measures 8 mm and is located in the upper inner quadrant. It is ER/PR positive and HER2 negative with a Ki67 of 2%. Her multidisciplinary medical team met prior to her assessments to determine a recommended treatment plan. She is planning to have a left lumpectomy and sentinel node biopsy followed by Oncotype testing, radiation, and anti-estrogen  therapy. Aaron Aas She will benefit from a post op PT reassessment to determine needs and from L-Dex screens every 3 months for 2 years to detect subclinical lymphedema.  Pt will benefit from skilled therapeutic intervention to improve on the following deficits: Decreased knowledge of precautions, impaired UE functional use, pain, decreased ROM, postural dysfunction.   PT treatment/interventions: ADL/self-care home management, pt/family education, therapeutic exercise  REHAB POTENTIAL: Excellent  CLINICAL DECISION MAKING: Stable/uncomplicated  EVALUATION COMPLEXITY: Low   GOALS: Goals reviewed with patient? YES  LONG TERM GOALS: (STG=LTG)    Name Target Date Goal status  1 Pt will be able to verbalize understanding of pertinent lymphedema risk reduction practices relevant to her dx specifically related to skin care.  Baseline:  No knowledge 09/03/2023 Achieved at eval  2 Pt will be able to return demo and/or verbalize understanding of the post op HEP related to regaining shoulder ROM. Baseline:  No knowledge 09/03/2023 Achieved at eval  3 Pt will be able to verbalize understanding of the importance of viewing the post op After Breast CA Class video for further lymphedema risk reduction education and therapeutic exercise.  Baseline:  No knowledge 09/03/2023 Achieved at eval  4 Pt will demo she has regained full shoulder ROM and function post operatively compared to baselines.  Baseline: See objective measurements taken today. 10/29/2023     PLAN:  PT FREQUENCY/DURATION: EVAL and 1 follow up appointment.   PLAN FOR NEXT SESSION: will reassess 3-4 weeks post op to determine needs.   Patient will follow up at outpatient cancer rehab 3-4 weeks following surgery.  If the patient requires physical therapy at that time, a specific plan will be dictated and sent to the referring physician for approval. The patient was educated today on appropriate basic range of motion exercises to begin post operatively  and the importance of viewing the After Breast Cancer class video following surgery.  Patient was educated today on lymphedema risk reduction practices as it pertains to recommendations that will benefit the patient immediately following surgery.  She verbalized good understanding.    Physical Therapy Information for After Breast Cancer Surgery/Treatment:  Lymphedema is a swelling condition that you may be at risk for in your arm if you have lymph nodes removed from the armpit area.  After a sentinel node biopsy, the risk is approximately 5-9% and is higher after an axillary node dissection.  There is treatment available for this condition and it is not life-threatening.  Contact your physician or physical therapist with concerns. You may begin the 4 shoulder/posture exercises (see additional sheet) when permitted by your physician (typically a week after surgery).  If you have drains, you may need to wait until those are removed before beginning range of motion exercises.  A general recommendation is to not lift your arms above shoulder height until drains are removed.  These exercises should  be done to your tolerance and gently.  This is not a "no pain/no gain" type of recovery so listen to your body and stretch into the range of motion that you can tolerate, stopping if you have pain.  If you are having immediate reconstruction, ask your plastic surgeon about doing exercises as he or she may want you to wait. We encourage you to view the After Breast Cancer class video following surgery.  You will learn information related to lymphedema risk, prevention and treatment and additional exercises to regain mobility following surgery.   While undergoing any medical procedure or treatment, try to avoid blood pressure being taken or needle sticks from occurring on the arm on the side of cancer.   This recommendation begins after surgery and continues for the rest of your life.  This may help reduce your risk of  getting lymphedema (swelling in your arm). An excellent resource for those seeking information on lymphedema is the National Lymphedema Network's web site. It can be accessed at www.lymphnet.org If you notice swelling in your hand, arm or breast at any time following surgery (even if it is many years from now), please contact your doctor or physical therapist to discuss this.  Lymphedema can be treated at any time but it is easier for you if it is treated early on.  If you feel like your shoulder motion is not returning to normal in a reasonable amount of time, please contact your surgeon or physical therapist.  [facility] [phone]. [address], [address], [place] [address]  [facility] CLASS After Breast Cancer Class  After Breast Cancer Class is a specially designed exercise class video to assist you in a safe recover after having breast cancer surgery.  In this video you will learn how to get back to full function whether your drains were just removed or if you had surgery a month ago. The video can be viewed on this page: [email or web address].boyd-meyer.org/ or on YouTube here: [email or web address].  Class Goals  Understand specific stretches to improve the flexibility of you chest and shoulder. Learn ways to safely strengthen your upper body and improve your posture. Understand the warning signs of infection and why you may be at risk for an arm infection. Learn about Lymphedema and prevention.  ** You do not need to view this video until after surgery.  Drains should be removed to participate in the recommended exercises on the video.  Patient was instructed today in a home exercise program today for post op shoulder range of motion. These included active assist shoulder flexion in sitting, scapular retraction, wall walking with shoulder abduction, and hands behind head external rotation.  She was  encouraged to do these twice a day, holding 3 seconds and repeating 5 times when permitted by her physician.  Rollin Clock, [place] 09/03/23 11:43 AM

## 2023-09-03 NOTE — Progress Notes (Signed)
 [facility] Clinical Social Work  Initial Assessment   Chelsea Soto is a 57 y.o. year old female accompanied by son-in-law, Madelyne Schiff. Clinical Social Work was referred by [facility] for assessment of psychosocial needs.   SDOH (Social Determinants of Health) assessments performed: Yes SDOH Interventions    Flowsheet Row Clinical Support from 09/03/2023 in [facility] Dept Of [place]. [facility]  SDOH Interventions   Food Insecurity Interventions Intervention Not Indicated  Housing Interventions Intervention Not Indicated  Transportation Interventions Intervention Not Indicated  Utilities Interventions Intervention Not Indicated       SDOH Screenings   Food Insecurity: No Food Insecurity (09/03/2023)  Housing: Low Risk  (09/03/2023)  Transportation Needs: No Transportation Needs (09/03/2023)  Utilities: Not At Risk (09/03/2023)  Depression (PHQ2-9): Low Risk  (09/03/2023)  Financial Resource Strain: Low Risk  (07/28/2022)   Received from [facility]  Physical Activity: Unknown (07/28/2022)   Received from [facility]  Recent Concern: Physical Activity - Inactive (07/28/2022)   Received from [facility], [facility]  Social Connections: Moderately Integrated (07/28/2022)   Received from [facility], [facility]  Stress: No Stress Concern Present (07/28/2022)   Received from [facility], [facility]  Tobacco Use: Low Risk  (09/03/2023)   Received from [facility]     Distress Screen completed: No     No data to display            Family/Social Information:  Housing Arrangement: patient lives with spouse. Daughter [name] Bennett and son in law Madelyne Schiff live nearby. Daughter Lynnea Satchel is moving to [employer] persons in your life? 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  Income source: Employment Financial concerns: No Type of concern: None Food  access concerns: no Religious or spiritual practice: Not known Advanced directives: Yes-not on file Services Currently in place:  [employer]  Coping/ Adjustment to diagnosis: Patient understands treatment plan and what happens next? yes, pt reports feeling little to no stress over diagnosis Patient reported stressors: Children Patient enjoys time with family/ friends Current coping skills/ strengths: Ability for insight , Active sense of humor , Capable of independent living , Communication skills , Motivation for treatment/growth , and Supportive family/friends     SUMMARY: Current SDOH Barriers:  No major barriers identified today  Clinical Social Work Clinical Goal(s):  No clinical social work goals at this time  Interventions: Discussed common feeling and emotions when being diagnosed with cancer, and the importance of support during treatment Informed patient of the support team roles and support services at [facility] Provided CSW contact information and encouraged patient to call with any questions or concerns Provided patient with information about local counselors for family member   Follow Up Plan: Patient will contact CSW with any support or resource needs Patient verbalizes understanding of plan: Yes    Manon Banbury E Jaqueline Uber, LCSW Clinical Social Worker [facility]

## 2023-09-04 ENCOUNTER — Other Ambulatory Visit: Payer: Self-pay | Admitting: Surgery

## 2023-09-04 ENCOUNTER — Encounter: Payer: Self-pay | Admitting: Genetic Counselor

## 2023-09-04 DIAGNOSIS — C50912 Malignant neoplasm of unspecified site of left female breast: Secondary | ICD-10-CM

## 2023-09-04 NOTE — Progress Notes (Signed)
 REFERRING PROVIDER: Cameron Cea, MD  PRIMARY PROVIDER:  Tristan Furlough, NP  PRIMARY REASON FOR VISIT:  1. Malignant neoplasm of upper-inner quadrant of left breast in female, estrogen receptor positive (HCC)   2. Family history of breast cancer   3. Family history of colon cancer    HISTORY OF PRESENT ILLNESS:   Ms. Vanwyk, a 58 y.o. female, was seen for a [place] cancer genetics consultation during the breast multidisciplinary clinic at the request of Dr. Lee Public due to a personal and family history of cancer.  Ms. Raju presents to clinic today to discuss the possibility of a hereditary predisposition to cancer, to discuss genetic testing, and to further clarify her future cancer risks, as well as potential cancer risks for family members.   In May 2025, at the age of 47, Ms. Kanaan was diagnosed with invasive ductal carcinoma of the left breast (ER/PR positive, HER2 negative).   CANCER HISTORY:  Oncology History  Malignant neoplasm of upper-inner quadrant of left breast in female, estrogen receptor positive (HCC)  08/26/2023 Initial Diagnosis   Screening mammogram detected left breast calcifications upper central 11 o'clock position 0.8 cm, ultrasound biopsy: Grade 2 IDC ER 100%, PR 100%, Ki67 2%, HER2 -1+ by IHC, axilla negative   09/03/2023 Cancer Staging   Staging form: Breast, AJCC 8th Edition - Clinical: Stage IA (cT1b, cN0, cM0, G2, ER+, PR+, HER2-) - Signed by Cameron Cea, MD on 09/03/2023 Stage prefix: Initial diagnosis Histologic grading system: 3 grade system    Past Medical History:  Diagnosis Date   Anxiety    Aortic atherosclerosis (HCC)    Back pain    Breast cancer (HCC)    Constipation    Depression    Diverticulosis    Dyspnea    chronic (with exertion)   Edema of both lower extremities    GERD (gastroesophageal reflux disease)    Hiatal hernia    Hip pain    Hypercholesterolemia    Hypertension    Joint pain    Knee pain    Mood  swings    Morbid obesity with BMI of 50.0-59.9, adult (HCC)    Post-menopausal bleeding    Prediabetes    Sleep apnea    Severe, on BiPAP   SOB (shortness of breath)    Umbilical hernia    Ventral hernia    Ventral hernia    Vitamin D  deficiency    Wears contact lenses     Past Surgical History:  Procedure Laterality Date   BREAST BIOPSY Left 08/26/2023   US  LT BREAST BX W LOC DEV 1ST LESION IMG BX SPEC US  GUIDE 08/26/2023 [facility] MAMMOGRAPHY   COLONOSCOPY     COLONOSCOPY WITH PROPOFOL  N/A 03/20/2023   Procedure: COLONOSCOPY WITH PROPOFOL ;  Surgeon: Nannette Babe, MD;  Location: [facility] ENDOSCOPY;  Service: Gastroenterology;  Laterality: N/A;   DILATATION & CURETTAGE/HYSTEROSCOPY WITH MYOSURE N/A 01/21/2019   Procedure: DILATATION & CURETTAGE/HYSTEROSCOPY WITH possible  MYOSURE;  Surgeon: Ashby Lawman, MD;  Location: [facility] OR;  Service: Gynecology;  Laterality: N/A;   LAPAROSCOPY N/A 09/14/2020   Procedure: DIAGNOSTIC LAPAROSCOPY;  Surgeon: Melvenia Stabs, MD;  Location: [facility] OR;  Service: General;  Laterality: N/A;   LYSIS OF ADHESION N/A 09/14/2020   Procedure: LYSIS OF ADHESION;  Surgeon: Melvenia Stabs, MD;  Location: [facility] OR;  Service: General;  Laterality: N/A;   POLYPECTOMY  03/20/2023   Procedure: POLYPECTOMY;  Surgeon: Nannette Babe, MD;  Location: [facility] ENDOSCOPY;  Service: Gastroenterology;;   VENTRAL HERNIA REPAIR N/A 09/14/2020   Procedure: OPEN REPAIR OF INCARCERATED VENTRAL HERNIA  ADULT;  Surgeon: Melvenia Stabs, MD;  Location: [facility] OR;  Service: General;  Laterality: N/A;   WISDOM TOOTH EXTRACTION      Social History   Socioeconomic History   Marital status: Married    Spouse name: Dwayne   Number of children: Not on file   Years of education: Not on file   Highest education level: Not on file  Occupational History   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  Tobacco Use   Smoking status: Never   Smokeless tobacco: Never  Vaping Use   Vaping  status: Never Used  Substance and Sexual Activity   Alcohol use: Yes    Comment: rare   Drug use: Never   Sexual activity: Yes  Other Topics Concern   Not on file  Social History Narrative   Not on file   Social Drivers of Health   Financial Resource Strain: Low Risk  (07/28/2022)   Received from [facility], [facility]   Overall Financial Resource Strain (CARDIA)    Difficulty of Paying Living Expenses: Not hard at all  Food Insecurity: No Food Insecurity (09/03/2023)   Hunger Vital Sign    Worried About Running Out of Food in the Last Year: Never true    Ran Out of Food in the Last Year: Never true  Transportation Needs: No Transportation Needs (09/03/2023)   PRAPARE - Administrator, Civil Service (Medical): No    Lack of Transportation (Non-Medical): No  Physical Activity: Unknown (07/28/2022)   Received from [facility]   Exercise Vital Sign    Days of Exercise per Week: 0 days    Minutes of Exercise per Session: Not on file  Recent Concern: Physical Activity - Inactive (07/28/2022)   Received from [facility], [facility]   Exercise Vital Sign    Days of Exercise per Week: 0 days    Minutes of Exercise per Session: 20 min  Stress: No Stress Concern Present (07/28/2022)   Received from [facility], [facility]ational [facility] - Occupational Stress Questionnaire    Feeling of Stress : Only a little  Social Connections: Moderately Integrated (07/28/2022)   Received from [facility], [facility]   Social Network    How would you rate your social network (family, work, friends)?: Adequate participation with social networks     FAMILY HISTORY:  We obtained a detailed, 4-generation family history.  Significant diagnoses are listed below: Family History  Problem Relation Age of Onset   Breast cancer Mother 80   Colon polyps Mother    Colon cancer Maternal Uncle 45   Colon cancer Maternal Grandmother 72   Breast cancer  Maternal Grandmother 36   Cancer Maternal Grandfather        unknown type     Ms. Grieshop reports her sister had hereditary cancer genetic testing recently and was found to have a BRIP1 gene mutation (we do not have a report to confirm). There is no reported Ashkenazi Jewish ancestry.   GENETIC COUNSELING ASSESSMENT: Ms. Dahan is a 58 y.o. female with a personal and family history of cancer which is somewhat suggestive of a hereditary cancer syndrome and predisposition to cancer. We, therefore, discussed and recommended the following at today's visit.   DISCUSSION: We discussed that 5 - 10% of cancer is hereditary, with most cases of hereditary breast  cancer associated with mutations in BRCA1/2.  There are other genes that can be associated with hereditary breast cancer syndromes. Type of cancer risk and level of risk are gene-specific. We discussed that testing is beneficial for several reasons including knowing how to follow individuals after completing their treatment, identifying whether potential treatment options would be beneficial, and understanding if other family members could be at risk for cancer and allowing them to undergo genetic testing.   We reviewed the characteristics, features and inheritance patterns of hereditary cancer syndromes. We also discussed genetic testing, including the appropriate family members to test, the process of testing, insurance coverage and turn-around-time for results. We discussed the implications of a negative, positive and/or variant of uncertain significant result. In order to get genetic test results in a timely manner so that Ms. Lindvall can use these genetic test results for surgical decisions, we recommended Ms. Shearer pursue genetic testing for the [facility] panel. Once complete, we recommend Ms. Lenhardt pursue reflex genetic testing to a more comprehensive gene panel.   Ms. Ranes was offered a common hereditary cancer panel (40 genes) and  an expanded pan-cancer panel (77 genes). Ms. Dickison was informed of the benefits and limitations of each panel, including that expanded pan-cancer panels contain genes that do not have clear management guidelines at this point in time.  We also discussed that as the number of genes included on a panel increases, the chances of variants of uncertain significance increases.  After considering the benefits and limitations of each gene panel, Ms. Pare elected to have [facility] CancerNext-Expanded Panel.  The CancerNext-Expanded gene panel offered by [facility] and includes sequencing, rearrangement, and RNA analysis for the following 77 genes: AIP, ALK, APC, ATM, AXIN2, BAP1, BARD1, BMPR1A, BRCA1, BRCA2, BRIP1, CDC73, CDH1, CDK4, CDKN1B, CDKN2A, CEBPA, CHEK2, CTNNA1, DDX41, DICER1, ETV6, FH, FLCN, GATA2, LZTR1, MAX, MBD4, MEN1, MET, MLH1, MSH2, MSH3, MSH6, MUTYH, NF1, NF2, NTHL1, PALB2, PHOX2B, PMS2, POT1, PRKAR1A, PTCH1, PTEN, RAD51C, RAD51D, RB1, RET, RPS20, RUNX1, SDHA, SDHAF2, SDHB, SDHC, SDHD, SMAD4, SMARCA4, SMARCB1, SMARCE1, STK11, SUFU, TMEM127, TP53, TSC1, TSC2, VHL, and WT1 (sequencing and deletion/duplication); EGFR, HOXB13, KIT, MITF, PDGFRA, POLD1, and POLE (sequencing only); EPCAM and GREM1 (deletion/duplication only).    Based on Ms. Hosek's personal and family history of cancer, she meets medical criteria for genetic testing. Despite that she meets criteria, she may still have an out of pocket cost. We discussed that if her out of pocket cost for testing is over $100, the laboratory should contact them to discuss self-pay prices, patient pay assistance programs, if applicable, and other billing options.   PLAN: After considering the risks, benefits, and limitations, Ms. Blumer provided informed consent to pursue genetic testing and the blood sample was sent to [facility] for analysis of the CancerNext-Expanded Panel. Results should be available within approximately 1-2 weeks'  time, at which point they will be disclosed by telephone to Ms. Elsey, as will any additional recommendations warranted by these results. Ms. Juma will receive a summary of her genetic counseling visit and a copy of her results once available. This information will also be available in Epic.   Ms. Herrada questions were answered to her satisfaction today. Our contact information was provided should additional questions or concerns arise. Thank you for the referral and allowing us  to share in the care of your patient.   Sue Mcalexander, MS, [facility] Genetic Counselor [email or web address] (P) [phone]  30 minutes were spent on the date of the encounter in service  to the patient including preparation, face-to-face consultation, documentation and care coordination. The patient brought her son-in-law. Drs. Gudena and/or Maryalice Smaller were available to discuss this case as needed.  _______________________________________________________________________ For Office Staff:  Number of people involved in session: 2 Was an Intern/ student involved with case: no

## 2023-09-05 ENCOUNTER — Telehealth: Payer: Self-pay | Admitting: *Deleted

## 2023-09-05 ENCOUNTER — Encounter: Payer: Self-pay | Admitting: *Deleted

## 2023-09-05 NOTE — Telephone Encounter (Signed)
 Spoke to pt concerning [facility] from 09/03/23. Denies questions or concerns regarding dx or treatment care plan. Encourage pt to call with needs. Received verbal understanding.

## 2023-09-05 NOTE — Addendum Note (Signed)
 Encounter addended by: Johna Myers, MD on: 09/05/2023 7:29 AM  Actions taken: Edit attestation on clinical note

## 2023-09-10 ENCOUNTER — Telehealth: Payer: Self-pay

## 2023-09-10 NOTE — Telephone Encounter (Signed)
 [employer] 2021-05 - Specimen Collection Study to Evaluate Biomarkers in Subjects with Cancer    Pt was contacted regarding the blood recollection for the above study. Pt was willing to have blood drawn again at 8:30AM on Thursday, 09/18/2023. Appointment was scheduled accordingly.   Pt was thanked for her time and participation. Contact information was provided and she was informed to reach out with any questions or concerns related to the study.   Devontre Siedschlag, Ph.D. Clinical Research Coordinator [phone] 09/10/2023 12:13 PM

## 2023-09-12 ENCOUNTER — Encounter: Payer: Self-pay | Admitting: Hematology and Oncology

## 2023-09-15 ENCOUNTER — Encounter: Payer: Self-pay | Admitting: Genetic Counselor

## 2023-09-15 ENCOUNTER — Telehealth: Payer: Self-pay | Admitting: Genetic Counselor

## 2023-09-15 DIAGNOSIS — Z1379 Encounter for other screening for genetic and chromosomal anomalies: Secondary | ICD-10-CM | POA: Insufficient documentation

## 2023-09-15 NOTE — Telephone Encounter (Signed)
 I contacted Ms. Hoobler to discuss her genetic testing results. No pathogenic variants were identified in the first 13 genes analyzed. Of note, we are still waiting on the pan-cancer panel. Detailed clinic note to follow.  The test report has been scanned into EPIC and is located under the Molecular Pathology section of the Results Review tab.  A portion of the result report is included below for reference.   Angila Wombles, MS, [facility] Genetic Counselor [email or web address] (P) [phone]

## 2023-09-17 ENCOUNTER — Other Ambulatory Visit: Payer: Self-pay | Admitting: *Deleted

## 2023-09-17 DIAGNOSIS — C50212 Malignant neoplasm of upper-inner quadrant of left female breast: Secondary | ICD-10-CM

## 2023-09-18 ENCOUNTER — Telehealth: Payer: Self-pay | Admitting: Genetic Counselor

## 2023-09-18 ENCOUNTER — Inpatient Hospital Stay

## 2023-09-18 ENCOUNTER — Encounter: Admitting: *Deleted

## 2023-09-18 NOTE — Telephone Encounter (Signed)
 I contacted Ms. Beg to discuss her genetic testing results. 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 The remaining 76 genes analyzed were negative. Detailed clinic note to follow.  The test report has been scanned into EPIC and is located under the Molecular Pathology section of the Results Review tab.  A portion of the result report is included below for reference.   Woodrow Drab, MS, [facility] Genetic Counselor [email or web address] (P) [phone]

## 2023-09-22 ENCOUNTER — Ambulatory Visit: Payer: Self-pay | Admitting: Genetic Counselor

## 2023-09-22 ENCOUNTER — Telehealth: Payer: Self-pay

## 2023-09-22 DIAGNOSIS — Z1379 Encounter for other screening for genetic and chromosomal anomalies: Secondary | ICD-10-CM

## 2023-09-22 DIAGNOSIS — Z1589 Genetic susceptibility to other disease: Secondary | ICD-10-CM | POA: Insufficient documentation

## 2023-09-22 DIAGNOSIS — C50212 Malignant neoplasm of upper-inner quadrant of left female breast: Secondary | ICD-10-CM

## 2023-09-22 NOTE — Progress Notes (Signed)
 HPI:   Chelsea Soto was previously seen in the [place] [facility] Genetics clinic due to a personal and family history of cancer and concerns regarding a hereditary predisposition to cancer. Please refer to our prior cancer genetics clinic note for more information regarding our discussion, assessment and recommendations, at the time. Chelsea Soto recent genetic test results were disclosed to her, as were recommendations warranted by these results. These results and recommendations are discussed in more detail below.  CANCER HISTORY:  Oncology History  Malignant neoplasm of upper-inner quadrant of left breast in female, estrogen receptor positive (HCC)  08/26/2023 Initial Diagnosis   Screening mammogram detected left breast calcifications upper central 11 o'clock position 0.8 cm, ultrasound biopsy: Grade 2 IDC ER 100%, PR 100%, Ki67 2%, HER2 -1+ by IHC, axilla negative   09/03/2023 Cancer Staging   Staging form: Breast, AJCC 8th Edition - Clinical: Stage IA (cT1b, cN0, cM0, G2, ER+, PR+, HER2-) - Signed by Odean Potts, MD on 09/03/2023 Stage prefix: Initial diagnosis Histologic grading system: 3 grade system    Genetic Testing   [facility] CancerNext-Expanded Panel+RNA was Positive. 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 Report date is 09/15/2023.   The CancerNext-Expanded gene panel offered by [facility] and includes sequencing, rearrangement, and RNA analysis for the following 77 genes: AIP, ALK, APC, ATM, AXIN2, BAP1, BARD1, BMPR1A, BRCA1, BRCA2, BRIP1, CDC73, CDH1, CDK4, CDKN1B, CDKN2A, CEBPA, CHEK2, CTNNA1, DDX41, DICER1, ETV6, FH, FLCN, GATA2, LZTR1, MAX, MBD4, MEN1, MET, MLH1, MSH2, MSH3, MSH6, MUTYH, NF1, NF2, NTHL1, PALB2, PHOX2B, PMS2, POT1, PRKAR1A, PTCH1, PTEN, RAD51C, RAD51D, RB1, RET, RPS20, RUNX1, SDHA, SDHAF2, SDHB, SDHC, SDHD, SMAD4, SMARCA4, SMARCB1, SMARCE1, STK11, SUFU, TMEM127, TP53, TSC1, TSC2, VHL, and WT1 (sequencing and deletion/duplication);  EGFR, HOXB13, KIT, MITF, PDGFRA, POLD1, and POLE (sequencing only); EPCAM and GREM1 (deletion/duplication only).       FAMILY HISTORY:  We obtained a detailed, 4-generation family history.  Significant diagnoses are listed below:      Family History  Problem Relation Age of Onset   Breast cancer Mother 39   Colon polyps Mother     Colon cancer Maternal Uncle 45   Colon cancer Maternal Grandmother 26   Breast cancer Maternal Grandmother 23   Cancer Maternal Grandfather          unknown type       Chelsea Soto reports her sister had hereditary cancer genetic testing recently and was found to have a BRIP1 gene mutation (we do not have a report to confirm). There is no reported Ashkenazi Jewish ancestry.   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   The test report has been scanned into EPIC and is located under the Molecular Pathology section of the Results Review tab.  A portion of the result report is included below for reference. Genetic testing reported out on 09/15/2023.           Cancer Risks for BRIP1: Ovarian cancer, 5-15% Currently, there is no known increase in cancer risk for males with a BRIP1 mutation.   Management Recommendations:   Ovarian Cancer Screening/Risk Reduction: A risk-reducing salpingo oophorectomy (RRSO), removal of the ovaries and fallopian tubes, is recommended starting at age 33-50 or earlier based on a specific family history of an earlier onset ovarian cancer. Having a RRSO is estimated to reduce the risk of ovarian cancer by up to 96%. There is still a small risk of developing an ovarian-like  cancer in the lining of the abdomen, called the peritoneum.   This information is based on current understanding of the gene and may change in the future.   Implications for Family Members: Hereditary predisposition to cancer due to pathogenic variants in the BRIP1 gene has autosomal dominant  inheritance. This means that an individual with a pathogenic variant has a 50% chance of passing the condition on to his/her offspring. Identification of a pathogenic variant allows for the recognition of at-risk relatives who can pursue testing for the familial variant.   Family members are encouraged to consider genetic testing for this familial pathogenic variant. As there are generally no childhood cancer risks associated with pathogenic variants in the BRIP1 gene, individuals in the family are not recommended to have testing until they reach at least 60 years of age. They may contact our office at [phone] for more information or to schedule an appointment.  Complimentary testing for the familial variant is available for 90 days from the report date.  Family members who live outside of the area are encouraged to find a genetic counselor in their area by visiting: [email or web address].  Additional Information: There is currently no well-established association between the BRIP1 gene and breast cancer. Even though a pathogenic variant was not identified that explains her personal history of breast cancer, possible explanations may include: Chelsea Soto cancer may not be hereditary. It may be due to other genetic or environmental factors. There may be a gene mutation in one of these genes that current testing methods cannot detect, but that chance is small. There could be another gene that has not yet been discovered, or that we have not yet tested, that is responsible for her personal history of breast cancer.  It is also possible we learn in the future that BRIP1 is associated with breast cancer. There is no well-established association at this time.  Our contact number was provided. Chelsea Soto questions were answered to her satisfaction, and she knows she is welcome to call us  at anytime with additional questions or concerns.   Silas Sedam, MS, [facility] Genetic  Counselor [email or web address] (P) [phone]

## 2023-09-22 NOTE — Telephone Encounter (Signed)
 [employer] 2021-05 - Specimen Collection Study to Evaluate Biomarkers in Subjects with Cancer    This CRC attempted to contact Mrs. Cerutti regarding the blood sample recollection. There was no response. A voice message was left with a callback number.   Keion Neels, Ph.D. Clinical Research Coordinator [phone] 09/22/2023 4:05 PM

## 2023-10-01 ENCOUNTER — Encounter: Payer: Self-pay | Admitting: Hematology and Oncology

## 2023-10-06 ENCOUNTER — Other Ambulatory Visit (HOSPITAL_BASED_OUTPATIENT_CLINIC_OR_DEPARTMENT_OTHER): Payer: Self-pay

## 2023-10-06 MED ORDER — LISINOPRIL-HYDROCHLOROTHIAZIDE 20-25 MG PO TABS
1.0000 | ORAL_TABLET | Freq: Every day | ORAL | 0 refills | Status: DC
  Filled 2023-10-06: qty 30, 30d supply, fill #0
  Filled 2023-11-11: qty 30, 30d supply, fill #1

## 2023-10-09 NOTE — Progress Notes (Signed)
 LVM with Tonya at [facility] to make her aware patient will need to be moved to Main OR due to BMI >55 (as of 08/2023)

## 2023-10-14 ENCOUNTER — Encounter

## 2023-10-15 NOTE — Telephone Encounter (Signed)
 [employer] 2021-05 - Specimen Collection Study to Evaluate Biomarkers in Subjects with Cancer    Chelsea Soto was contacted by phone at 12:12PM today regarding the blood re-collection for the above study. Pt stated that she would like to have blood drawn at 8:30 on Monday, 7/21. Appts have been scheduled accordingly.   Pt was thanked for her time and continued support of research.   Alisan Dokes, Ph.D. Clinical Research Coordinator [phone] 10/15/2023 12:30PM

## 2023-10-16 ENCOUNTER — Encounter

## 2023-10-16 ENCOUNTER — Other Ambulatory Visit (HOSPITAL_BASED_OUTPATIENT_CLINIC_OR_DEPARTMENT_OTHER): Payer: Self-pay | Admitting: Family Medicine

## 2023-10-16 DIAGNOSIS — E785 Hyperlipidemia, unspecified: Secondary | ICD-10-CM

## 2023-10-17 DIAGNOSIS — Z170 Estrogen receptor positive status [ER+]: Secondary | ICD-10-CM

## 2023-10-17 NOTE — Research (Signed)
 [employer] 2021-05 - Specimen Collection Study to Evaluate Biomarkers in Subjects with Cancer     This Nurse has reviewed this patient's inclusion and exclusion criteria as a second review and confirms Chelsea Soto is eligible for study participation.  Patient may continue with enrollment.  Reena Romans, RN 10/17/23 12:21 PM

## 2023-10-20 ENCOUNTER — Encounter: Attending: Hematology and Oncology

## 2023-10-20 ENCOUNTER — Inpatient Hospital Stay

## 2023-10-21 ENCOUNTER — Encounter: Payer: Self-pay | Admitting: *Deleted

## 2023-10-23 ENCOUNTER — Ambulatory Visit (HOSPITAL_BASED_OUTPATIENT_CLINIC_OR_DEPARTMENT_OTHER)
Admission: RE | Admit: 2023-10-23 | Discharge: 2023-10-23 | Disposition: A | Discharge status: Home / Self Care | Payer: Self-pay | Source: Ambulatory Visit | Attending: Family Medicine | Admitting: Family Medicine

## 2023-10-23 DIAGNOSIS — E785 Hyperlipidemia, unspecified: Secondary | ICD-10-CM | POA: Insufficient documentation

## 2023-10-23 NOTE — Progress Notes (Signed)
 Surgical Instructions   Your procedure is scheduled on Thurday July 31. Report to [facility] Main Entrance A at 5:30 A.M., then check in with the Admitting office. Any questions or running late day of surgery: call [phone]  Questions prior to your surgery date: call [phone], Monday-Friday, 8am-4pm. If you experience any cold or flu symptoms such as cough, fever, chills, shortness of breath, etc. between now and your scheduled surgery, please notify us  at the above number.     Remember:  Do not eat after midnight the night before your surgery   You may drink clear liquids until 4:30am the morning of your surgery.   Clear liquids allowed are: Water, Non-Citrus Juices (without pulp), Carbonated Beverages, Clear Tea (no milk, honey, etc.), Black Coffee Only (NO MILK, CREAM OR POWDERED CREAMER of any kind), and Gatorade.    Take these medicines the morning of surgery with A SIP OF WATER: none   May take these medicines IF NEEDED: dicyclomine  (BENTYL )    One week prior to surgery, STOP taking any Aspirin (unless otherwise instructed by your surgeon) Aleve, Naproxen, Ibuprofen , Motrin , Advil , Goody's, BC's, all herbal medications, fish oil, and non-prescription vitamins.                     Do NOT Smoke (Tobacco/Vaping) for 24 hours prior to your procedure.  If you use a CPAP at night, you may bring your mask/headgear for your overnight stay.   You will be asked to remove any contacts, glasses, piercing's, hearing aid's, dentures/partials prior to surgery. Please bring cases for these items if needed.    Patients discharged the day of surgery will not be allowed to drive home, and someone needs to stay with them for 24 hours.  SURGICAL WAITING ROOM VISITATION Patients may have no more than 2 support people in the waiting area - these visitors may rotate.   Pre-op nurse will coordinate an appropriate time for 1 ADULT support person, who may not rotate, to accompany patient  in pre-op.  Children under the age of 35 must have an adult with them who is not the patient and must remain in the main waiting area with an adult.  If the patient needs to stay at the hospital during part of their recovery, the visitor guidelines for inpatient rooms apply.  Please refer to the [place] [facility] website for the visitor guidelines for any additional information.   If you received a COVID test during your pre-op visit  it is requested that you wear a mask when out in public, stay away from anyone that may not be feeling well and notify your surgeon if you develop symptoms. If you have been in contact with anyone that has tested positive in the last 10 days please notify you surgeon.      Pre-operative CHG Bathing Instructions   You can play a key role in reducing the risk of infection after surgery. Your skin needs to be as free of germs as possible. You can reduce the number of germs on your skin by washing with CHG (chlorhexidine  gluconate) soap before surgery. CHG is an antiseptic soap that kills germs and continues to kill germs even after washing.   DO NOT use if you have an allergy to chlorhexidine /CHG or antibacterial soaps. If your skin becomes reddened or irritated, stop using the CHG and notify one of our RNs at [phone].              TAKE A  SHOWER THE NIGHT BEFORE SURGERY AND THE DAY OF SURGERY    Please keep in mind the following:  DO NOT shave, including legs and underarms, 48 hours prior to surgery.   You may shave your face before/day of surgery.  Place clean sheets on your bed the night before surgery Use a clean washcloth (not used since being washed) for each shower. DO NOT sleep with pet's night before surgery.  CHG Shower Instructions:  Wash your face and private area with normal soap. If you choose to wash your hair, wash first with your normal shampoo.  After you use shampoo/soap, rinse your hair and body thoroughly to remove shampoo/soap residue.   Turn the water OFF and apply half the bottle of CHG soap to a CLEAN washcloth.  Apply CHG soap ONLY FROM YOUR NECK DOWN TO YOUR TOES (washing for 3-5 minutes)  DO NOT use CHG soap on face, private areas, open wounds, or sores.  Pay special attention to the area where your surgery is being performed.  If you are having back surgery, having someone wash your back for you may be helpful. Wait 2 minutes after CHG soap is applied, then you may rinse off the CHG soap.  Pat dry with a clean towel  Put on clean pajamas    Additional instructions for the day of surgery: DO NOT APPLY any lotions, deodorants, cologne, or perfumes.   Do not wear jewelry or makeup Do not wear nail polish, gel polish, artificial nails, or any other type of covering on natural nails (fingers and toes) Do not bring valuables to the hospital. [facility] is not responsible for valuables/personal belongings. Put on clean/comfortable clothes.  Please brush your teeth.  Ask your nurse before applying any prescription medications to the skin.

## 2023-10-24 ENCOUNTER — Encounter (HOSPITAL_COMMUNITY): Payer: Self-pay

## 2023-10-24 ENCOUNTER — Other Ambulatory Visit: Payer: Self-pay

## 2023-10-24 ENCOUNTER — Encounter (HOSPITAL_COMMUNITY)
Admission: RE | Admit: 2023-10-24 | Discharge: 2023-10-24 | Disposition: A | Discharge status: Home / Self Care | Source: Ambulatory Visit | Attending: Surgery | Admitting: Surgery

## 2023-10-24 VITALS — BP 133/68 | HR 64 | Temp 98.4°F | Resp 17 | Ht 63.0 in | Wt 331.6 lb

## 2023-10-24 DIAGNOSIS — Z170 Estrogen receptor positive status [ER+]: Secondary | ICD-10-CM | POA: Diagnosis not present

## 2023-10-24 DIAGNOSIS — G4733 Obstructive sleep apnea (adult) (pediatric): Secondary | ICD-10-CM | POA: Diagnosis not present

## 2023-10-24 DIAGNOSIS — I700 Atherosclerosis of aorta: Secondary | ICD-10-CM | POA: Insufficient documentation

## 2023-10-24 DIAGNOSIS — I10 Essential (primary) hypertension: Secondary | ICD-10-CM | POA: Diagnosis not present

## 2023-10-24 DIAGNOSIS — Z01818 Encounter for other preprocedural examination: Secondary | ICD-10-CM | POA: Diagnosis present

## 2023-10-24 DIAGNOSIS — C50212 Malignant neoplasm of upper-inner quadrant of left female breast: Secondary | ICD-10-CM | POA: Diagnosis not present

## 2023-10-24 DIAGNOSIS — R7303 Prediabetes: Secondary | ICD-10-CM | POA: Diagnosis not present

## 2023-10-24 LAB — CBC
HCT: 41.4 % (ref 36.0–46.0)
Hemoglobin: 13.4 g/dL (ref 12.0–15.0)
MCH: 28.2 pg (ref 26.0–34.0)
MCHC: 32.4 g/dL (ref 30.0–36.0)
MCV: 87.2 fL (ref 80.0–100.0)
Platelets: 292 K/uL (ref 150–400)
RBC: 4.75 MIL/uL (ref 3.87–5.11)
RDW: 14.3 % (ref 11.5–15.5)
WBC: 8.1 K/uL (ref 4.0–10.5)
nRBC: 0 % (ref 0.0–0.2)

## 2023-10-24 LAB — BASIC METABOLIC PANEL WITH GFR
Anion gap: 10 (ref 5–15)
BUN: 17 mg/dL (ref 6–20)
CO2: 23 mmol/L (ref 22–32)
Calcium: 9 mg/dL (ref 8.9–10.3)
Chloride: 102 mmol/L (ref 98–111)
Creatinine, Ser: 0.49 mg/dL (ref 0.44–1.00)
GFR, Estimated: >60 mL/min (ref >60)
Glucose, Bld: 114 mg/dL — ABNORMAL HIGH (ref 70–99)
Potassium: 3.8 mmol/L (ref 3.5–5.1)
Sodium: 135 mmol/L (ref 135–145)

## 2023-10-24 NOTE — Progress Notes (Signed)
 PCP - KATHRYN TIMBERLAKE  Cardiologist -   PPM/ICD - denies Device Orders - n/a Rep Notified - n/a  Chest x-ray -  EKG - 10-24-23 Stress Test - 2022 Robie, Youlanda, MD)  (per patient was done routine as check up) ECHO - 08-15-20 (CE) Cardiac Cath -  PFT- 06-06-23 (CE) requested Per patient was repeated for insurance purposes to obtain new machine  Sleep Study - 03-2018 CPAP - uses Bipap  DM- denies  Blood Thinner Instructions: denies Aspirin Instructions:denies  ERAS Protcol - clear liquids until  4:30 am. PRE-SURGERY Ensure or G2-   COVID TEST-    Anesthesia review: yes Hx of HTN, OSA uses Bipap   Patient denies shortness of breath, fever, cough and chest pain at PAT appointment   All instructions explained to the patient, with a verbal understanding of the material. Patient agrees to go over the instructions while at home for a better understanding. Patient also instructed to self quarantine after being tested for COVID-19. The opportunity to ask questions was provided.

## 2023-10-27 NOTE — H&P (Signed)
 History of Present Illness: Chelsea Soto is a 58 y.o. female who is seen today as an office consultation for evaluation of Breast Cancer  Patient seen in [facility] today for newly diagnosed left breast cancer upper inner quadrant measuring 8 mm. This is ER positive, PR positive, HER2/neu negative. She has no other complaints today.  Review of Systems: A complete review of systems was obtained from the patient. I have reviewed this information and discussed as appropriate with the patient. See HPI as well for other ROS.    Medical History: Past Medical History:  Diagnosis Date  Anxiety  GERD (gastroesophageal reflux disease)  History of cancer  Hyperlipidemia 2012  Hypertension 2012  IFG (impaired fasting glucose)  Obesity, morbid, BMI 50 or higher (CMS/HHS-HCC)  Sleep apnea   Patient Active Problem List  Diagnosis  IFG (impaired fasting glucose)  Hyperlipidemia  Hypertension  Obesity, morbid, BMI 50 or higher (CMS/HHS-HCC)  Hypertensive disorder  Malignant neoplasm of upper-inner quadrant of left breast in female, estrogen receptor positive (CMS/HHS-HCC)   Past Surgical History:  Procedure Laterality Date  COLONOSCOPY N/A 03/20/2023  polypectomy N/A 03/20/2023  .Left Breast Biopsy Left 08/26/2023  REPAIR INCISIONAL/VENTRAL HERNIA    No Known Allergies  Current Outpatient Medications on File Prior to Visit  Medication Sig Dispense Refill  aspirin 81 MG EC tablet Take 81 mg by mouth once daily.  cholecalciferol, vitamin D3, (VITAMIN D3) 125 mcg (5,000 unit) tablet Take 5,000 Units by mouth once daily.  dicyclomine  (BENTYL ) 10 mg capsule Take by mouth 4 (four) times daily as needed  krill-om-3-dha-epa-phospho-ast 1,000-170-50-80 mg Cap Take 1 capsule by mouth once daily. (Patient not taking: Reported on 11/26/2021)  lactobacillus comb no.10 20 billion cell Cap Take 1 capsule by mouth once daily. (Patient not taking: Reported on 11/26/2021)  lisinopriL  (ZESTRIL ) 1 mg/mL oral  suspension lisinopril   lisinopril -hydrochlorothiazide  (PRINZIDE ,ZESTORETIC ) 20-25 mg tablet  meloxicam (MOBIC) 15 MG tablet once daily  nutritional supplements Liqd Take by mouth once daily. (Patient not taking: Reported on 11/26/2021)  omeprazole  (PRILOSEC) 40 MG DR capsule once daily  rosuvastatin  (CRESTOR ) 20 MG tablet once daily  rosuvastatin  (CRESTOR ) 20 MG tablet (Patient not taking: Reported on 11/26/2021)  semaglutide  (WEGOVY ) 2.4 mg/0.75 mL pen injector Inject subcutaneously once a week   No current facility-administered medications on file prior to visit.   Family History  Problem Relation Age of Onset  Skin cancer Mother  Obesity Mother  High blood pressure (Hypertension) Mother  Diabetes Mother  Breast cancer Mother  Diabetes type II Mother  Skin cancer Father  High blood pressure (Hypertension) Father  Hyperlipidemia (Elevated cholesterol) Father  Coronary Artery Disease (Blocked arteries around heart) Father  Colon cancer Sister  Stroke Sister  Skin cancer Sister  Diabetes type II Sister  Obesity Sister  No Known Problems Sister  No Known Problems Sister  No Known Problems Daughter  No Known Problems Daughter    Social History   Tobacco Use  Smoking Status Never  Smokeless Tobacco Never    Social History   Socioeconomic History  Marital status: Married  Tobacco Use  Smoking status: Never  Smokeless tobacco: Never  Vaping Use  Vaping status: Never Used  Substance and Sexual Activity  Alcohol use: Not Currently  Drug use: Not Currently  Social History Narrative  ** Merged History Encounter **    Social Drivers of Health   Financial Resource Strain: Low Risk (07/28/2022)  Received from [facility]  Overall Financial Resource Strain (CARDIA)  Difficulty of  Paying Living Expenses: Not hard at all  Food Insecurity: No Food Insecurity (07/28/2022)  Received from [facility]  Hunger Vital Sign  Worried About Running Out of Food in the Last Year:  Never true  Ran Out of Food in the Last Year: Never true  Transportation Needs: No Transportation Needs (07/28/2022)  Received from [facility] - Transportation  Lack of Transportation (Medical): No  Lack of Transportation (Non-Medical): No  Physical Activity: Unknown (07/28/2022)  Received from [facility]  Exercise Vital Sign  Days of Exercise per Week: 0 days  Stress: No Stress Concern Present (07/28/2022)  Received from [facility] - Occupational Stress Questionnaire  Feeling of Stress : Only a little  Social Connections: Moderately Integrated (07/28/2022)  Received from [facility]  Social Network  How would you rate your social network (family, work, friends)?: Adequate participation with social networks  Housing Stability: Low Risk (07/28/2022)  Received from [facility] Stability Vital Sign  Unable to Pay for Housing in the Last Year: No  Number of Places Lived in the Last Year: 1  Unstable Housing in the Last Year: No   Objective:  There were no vitals filed for this visit.  There is no height or weight on file to calculate BMI.  Physical Exam Exam conducted with a chaperone present.  HENT:  Head: Normocephalic.  Eyes:  Pupils: Pupils are equal, round, and reactive to light.  Cardiovascular:  Rate and Rhythm: Normal rate.  Pulmonary:  Effort: Pulmonary effort is normal.  Chest:  Breasts: Right: Normal. No mass.  Left: Normal. No mass.  Musculoskeletal:  Cervical back: Normal range of motion.  Lymphadenopathy:  Upper Body:  Right upper body: No supraclavicular or axillary adenopathy.  Left upper body: No supraclavicular or axillary adenopathy.  Skin: General: Skin is warm.  Neurological:  General: No focal deficit present.  Mental Status: She is alert.     Labs, Imaging and Diagnostic Testing:  FINAL DIAGNOSIS   1. 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  - TUBULE FORMATION: SCORE 3  - NUCLEAR PLEOMORPHISM: SCORE 2  - MITOTIC COUNT: SCORE 1  - TOTAL SCORE: 6  - OVERALL GRADE: 2  - LYMPHOVASCULAR INVASION: NOT IDENTIFIED  - CANCER LENGTH: 8 MM  - CALCIFICATIONS: NOT IDENTIFIED  - DUCTAL CARCINOMA IN SITU: NOT IDENTIFIED  - SEE NOTE.   Diagnosis Note : These results were communicated to Hendricks Benders, RN at [facility] [place] on 08/27/2023.  ER, PR, HER2, and Ki-67 will be performed on block 1A and reported in an  addendum.  This case underwent intradepartmental consultation and Dr. Rebbecca concurs with  the interpretation.   DATE SIGNED OUT: 08/27/2023  ELECTRONIC SIGNATURE : Janel Md, Rexene , Pathologist, Electronic Signature   MICROSCOPIC DESCRIPTION   CASE COMMENTS  STAINS USED IN DIAGNOSIS:  H&E-2  H&E-3  H&E-4  H&E  *RECUT 1 SLIDE  Stains used in diagnosis 1 Her2 by IHC, 1 ER-ACIS, 1 KI-67-ACIS, 1 PR-ACIS  IHC scores are reported using ASCO/CAP scoring criteria. An IHC Score of 0 or  1+ is NEGATIVE for HER2, 3+ is POSITIVE for HER2, and 2+ is EQUIVOCAL.  Equivocal results are reflexed to either FISH or IHC testing. Specimens are  fixed in 10% Neutral Buffered Formalin for  at least 6 hours and up to 72 hours.  These tests have not be validated on decalcified tissue. Results should be  interpreted with caution given the possibility of false negative results on  decalcified specimens. Antibody Clone for HER2 is 4B5 (PATHWAY). Some of these  immunohistochemical stains may have been developed and the performance  characteristics determined by [facility]. Some may not have been  cleared or approved by the U.S. Food and Drug Administration. The FDA has  determined that such clearance or approval is not necessary. This test is used  for clinical purposes. It should not be regarded as investigational  or for  research. This laboratory is certified under the Clinical Laboratory  Improvement Amendments of 1988 (CLIA-88) as qualified to perform high complexity  clinical laboratory testing.  Estrogen receptor (6F11), immunohistochemical stains are performed on formalin  fixed, paraffin embedded tissue using a 3,3-diaminobenzidine (DAB) chromogen  and Leica Bond Autostainer System. The staining intensity of the nucleus is  scored manually and is reported as the percentage of tumor cell nuclei  demonstrating specific nuclear staining.Specimens are fixed in 10% Neutral  Buffered Formalin for at least 6 hours and up to 72 hours. These tests have not  be validated on decalcified tissue. Results should be interpreted with caution  given the possibility of false negative results on decalcified specimens.  Ki-67 (MM1), immunohistochemical stains are performed on formalin fixed,  paraffin embedded tissue using a 3,3-diaminobenzidine (DAB) chromogen and Leica  Bond Autostainer System. The staining intensity of the nucleus is scored  manually and is reported as the percentage of tumor cell nuclei demonstrating  specific nuclear staining.Specimens are fixed in 10% Neutral Buffered Formalin  for at least 6 hours and up to 72 hours. These tests have not be validated on  decalcified tissue. Results should be interpreted with caution given the  possibility of false negative results on decalcified specimens.  PR progesterone receptor (16), immunohistochemical stains are performed on  formalin fixed, paraffin embedded tissue using a 3,3-diaminobenzidine (DAB)  chromogen and Leica Bond Autostainer System. The staining intensity of the  nucleus is scored manually and is reported as the percentage of tumor cell  nuclei demonstrating specific nuclear staining.Specimens are fixed in 10%  Neutral Buffered Formalin for at least 6 hours and up to 72 hours. These tests  have not be validated on decalcified tissue.  Results should be interpreted with  caution given the possibility of false negative results on decalcified  specimens.   ADDENDUM  Breast, left, needle core biopsy  PROGNOSTIC INDICATORS  Results:  IMMUNOHISTOCHEMICAL AND MORPHOMETRIC ANALYSIS PERFORMED MANUALLY  The tumor cells are negative for Her2 (1+).  Estrogen Receptor: 100%, positive, strong staining intensity  Progesterone Receptor: 100%, positive, strong staining intensity  Proliferation Marker Ki67: 2%  COMMENT: The negative hormone receptor study(ies) in this case has an internal positive control.   REFERENCE RANGE ESTROGEN RECEPTOR  NEGATIVE 0%  POSITIVE =>1%  REFERENCE RANGE PROGESTERONE RECEPTOR  NEGATIVE 0%  POSITIVE =>1%  All controls stained appropriately  Pepper Dutton Md, Pathologist, Electronic Signature  ( Signed 05 29 2025)   CLINICAL HISTORY   SPECIMEN(S) OBTAINED  1. Breast, left, needle core biopsy, 11 O'clock, 7 Cmfn Irreg 8 Mm Mass, Suspect  [facility] Likely B9 FN, Fibrosis Etc   SPECIMEN COMMENTS:  1. In formalin: 10:25, CIT < 30 sec  SPECIMEN CLINICAL INFORMATION:   Gross Description  1. Received in formalin labeled with the patient's name Klani Caridi)  and  LTBR 11 ock 7 cmfn are four pieces of yellow-tan fibrofatty tissue ranging  from 0.5 x 0.2 x 0.2 cm to 1.5 x 0.2 x 0.2 cm, submitted in toto in a single  cassette. TIF 10:25, CIT <30 seconds. (LEF, 08/26/2023)   CLINICAL DATA: 58 year old female presents for further evaluation  of possible LEFT breast mass on screening mammogram.   EXAM:  DIGITAL DIAGNOSTIC UNILATERAL LEFT MAMMOGRAM WITH TOMOSYNTHESIS AND  CAD; ULTRASOUND LEFT BREAST LIMITED   TECHNIQUE:  Left digital diagnostic mammography and breast tomosynthesis was  performed. The images were evaluated with computer-aided detection.  ; Targeted ultrasound examination of the left breast was performed.   COMPARISON: Previous exam(s).   ACR Breast Density Category a: The  breasts are almost entirely  fatty.   FINDINGS:  Spot compression views of the LEFT breast demonstrate a persistent  irregular mass within the middle depth UPPER LEFT breast.   Targeted ultrasound is performed, showing a 0.8 x 0.6 x 0.7 cm  irregular hypoechoic mass at the 11 o'clock position of the LEFT  breast 7 cm from the nipple.   No abnormal LEFT axillary lymph nodes identified.   IMPRESSION:  Suspicious 0.8 cm UPPER INNER LEFT breast mass. Ultrasound-guided  biopsy is recommended. No abnormal appearing LEFT axillary lymph  nodes.   RECOMMENDATION:  Ultrasound-guided LEFT breast biopsy, which will be scheduled.   I have discussed the findings and recommendations with the patient.  If applicable, a reminder letter will be sent to the patient  regarding the next appointment.   BI-RADS CATEGORY 4: Suspicious.   Assessment and Plan:   Diagnoses and all orders for this visit:  Malignant neoplasm of upper-inner quadrant of left breast in female, estrogen receptor positive (CMS/HHS-HCC)   Reviewed surgical options today.  Reviewed mastectomy reconstruction, breast conserving surgery, local regional recurrence, survival and overall quality of life.  Patient is opted for left breast seed localized lumpectomy with sentinel lymph node mapping. Discussed risk of operating as well as cosmesis and lymphedema risk.The procedure has been discussed with the patient. Alternatives to surgery have been discussed with the patient. Risks of surgery include bleeding, Infection, Seroma formation, death, and the need for further surgery. The patient understands and wishes to proceed.    DEBBY CURTISTINE SHIPPER, MD

## 2023-10-27 NOTE — Progress Notes (Signed)
 Anesthesia Chart Review:  Case: [id number] Date/Time: 10/30/23 0715   Procedure: BREAST LUMPECTOMY WITH RADIOACTIVE SEED AND SENTINEL LYMPH NODE BIOPSY (Left: Breast) - LEFT BREAST SEED LUMPECTOMY LEFT AXILLARY SENTINEL LYMPH NODE MAPPING   Anesthesia type: General   Diagnosis: Malignant neoplasm of upper-inner quadrant of left breast in female, estrogen receptor positive (HCC) [C50.212, Z17.0]   Pre-op diagnosis: LEFT BREAST CANCER   [facility] / [facility]   Surgeons: Vanderbilt Ned, MD       DISCUSSION: Patient is a 58 year old female scheduled for the above procedure. Case was scheduled at [facility] but moved to Main OR due to BMI > 55.   History includes never smoker, HTN, hypercholesterolemia, aortic atherosclerosis, chronic DOE, GERD, hiatal hernia, OSA (uses BiPAP), morbid obesity, prediabetes, back pain, LE edema, hernia (s/p [facility] 09/14/20), left breast cancer (invasive mammary carcinoma 08/26/23) , anxiety.  PCP Dr. Chrystal had ordered a CT coronary calcium  score for HLD (LDL1 150 in July 2025). Patient contacted Dr. Chrystal in early July to let her know that breast cancer surgery planned and wanting to know about timing of test. Per notes in Care Everywhere, she was told your scan can definitely wait until after your surgery, it's not urgent at all. You can send us  a message when you are ready to schedule it, even if its months from now... She ended up getting the study done on 10/23/23, but the result is not yet available. She did have a non-ischemic stress echo in 2022. Known chronic DOE. She denied chest pain and SOB at PAT visit. 10/24/23 EKG showed NSR.  RSL date is not documented (date pending at time case was booked). She is post-menopausal. Anesthesia team to evaluate on the day of surgery.    VS: BP 133/68   Pulse 64   Temp 36.9 C   Resp 17   Ht 5' 3 (1.6 m)   Wt (!) 150.4 kg   SpO2 97%   BMI 58.74 kg/m   PROVIDERS: Chrystal Lamarr RAMAN, MD is PCP  Adnan,  Javaid, MD is pulmonologist. Last APP visit 06/06/23. Using BiPAP for OSA.     LABS: Labs reviewed: Acceptable for surgery. AST 12 and ALT 11 on 09/03/23.  (all labs ordered are listed, but only abnormal results are displayed)  Labs Reviewed  BASIC METABOLIC PANEL WITH GFR - Abnormal; Notable for the following components:      Result Value   Glucose, Bld 114 (*)    All other components within normal limits  CBC   Labs results from [facility] (see [facility]) include TSH 3.59 reviewed 09/18/23 and Lipid Panel reviewed 10/03/23 that showed TC 222, HDL 43, LDL 150.    OTHER: Spirometry 06/06/23 ([facility]): The FEV1 and FVC are mildly reduced and suggest a restrictive lung defect.  The TLC is mildly reduced.  There is a mild reduction of the diffusing capacity.   Split Night Sleep Study 03/10/18 ([facility]): The patient was in for a split-night PSG. Study showed severe OSA and snoring. Patient met split protocol and was started on CPAP at 5cm. CPAP was increased to 15cm but patient continued to have events. CPAP was switched to BiPAP at 16/12cm and increased up to 22/18cm with good results. Some light snoring was noted at the end but patient asked to end the study. The ResMed AirFit F30 small full face mask was used during the titration.    IMAGES: CTA Chest 08/02/21: IMPRESSION: 1. Negative for pulmonary embolism  or other acute intrathoracic findings. 2. Stable 1.7 cm benign left adrenal adenoma. 3. Aortic and coronary artery atherosclerosis (ICD10-I70.0).  EKG: 10/24/2023: Normal sinus rhythm   CV: CT Cardiac Scoring 10/23/23: Report is still in process.   Exercise Stress Echo 08/15/20 ([facility]):   Post-stress Impression: The ECG and Echo portions of the stress study  are concordant with no evidence of inducible myocardial ischemia. Post stress EF 75%.    Left Ventricle: There is borderline concentric hypertrophy.   No significant valvular disease noted.    Past Medical History:   Diagnosis Date   Anxiety    Aortic atherosclerosis (HCC)    Back pain    Breast cancer (HCC)    Constipation    Depression    Diverticulosis    Dyspnea    chronic (with exertion)   Edema of both lower extremities    GERD (gastroesophageal reflux disease)    Hiatal hernia    Hip pain    Hypercholesterolemia    Hypertension    Joint pain    Knee pain    Mood swings    Morbid obesity with BMI of 50.0-59.9, adult (HCC)    Post-menopausal bleeding    Prediabetes    Sleep apnea    Severe, on BiPAP   SOB (shortness of breath)    Umbilical hernia    Ventral hernia    Ventral hernia    Vitamin D  deficiency    Wears contact lenses     Past Surgical History:  Procedure Laterality Date   BREAST BIOPSY Left 08/26/2023   US  LT BREAST BX W LOC DEV 1ST LESION IMG BX SPEC US  GUIDE 08/26/2023 [facility]MO[facility]   COLONOSCOPY     COLONOSCOPY WITH PROPOFOL  N/A 03/20/2023   Procedure: COLONOSCOPY WITH PROPOFOL ;  Surgeon: Albertus Gordy HERO, MD;  Location: [facility] ENDOSCOPY;  Service: Gastroenterology;  Laterality: N/A;   DILATATION & CURETTAGE/HYSTEROSCOPY WITH MYOSURE N/A 01/21/2019   Procedure: DILATATION & CURETTAGE/HYSTEROSCOPY WITH possible  MYOSURE;  Surgeon: Latisha Medford, MD;  Location: [facility] OR;  Service: Gynecology;  Laterality: N/A;   LAPAROSCOPY N/A 09/14/2020   Procedure: DIAGNOSTIC LAPAROSCOPY;  Surgeon: Teresa Lonni HERO, MD;  Location[facility];  Service: General;  Laterality: N/A;   LYSIS OF ADHESION N/A 09/14/2020   Procedure: LYSIS OF ADHESION;  Surgeon: Teresa Lonni HERO, MD;  Location: [facility] OR;  Service: General;  Laterality: N/A;   POLYPECTOMY  03/20/2023   Procedure: POLYPECTOMY;  Surgeon: Albertus Gordy HERO, MD;  Location: [facility] ENDOSCOPY;  Service: Gastroenterology;;   VENTRAL HERNIA REPAIR N/A 09/14/2020   Procedure: OPEN REPAIR OF INCARCERATED VENTRAL HERNIA  ADULT;  Surgeon: Teresa Lonni HERO, MD;  Location: [facility] OR;  Service: General;  Laterality: N/A;   WISDOM TOOTH  EXTRACTION      MEDICATIONS:  Cholecalciferol (VITAMIN D -3) 125 MCG (5000 UT) TABS   dicyclomine  (BENTYL ) 10 MG capsule   lisinopril -hydrochlorothiazide  (ZESTORETIC ) 20-25 MG tablet   Multiple Vitamin (MULTIVITAMIN WITH MINERALS) TABS tablet   No current facility-administered medications for this encounter.   Isaiah Ruder, PA-C Surgical Short Stay/Anesthesiology [facility] Phone [phone] [facility] Phone [phone] 10/27/2023 5:18 PM

## 2023-10-27 NOTE — Anesthesia Preprocedure Evaluation (Signed)
 Anesthesia Evaluation  Patient identified by MRN, date of birth, ID band Patient awake    Reviewed: Allergy & Precautions, H&P , NPO status , Patient's Chart, lab work & pertinent test results  Airway Mallampati: II  TM Distance: >3 FB Neck ROM: Full    Dental no notable dental hx.    Pulmonary sleep apnea    Pulmonary exam normal breath sounds clear to auscultation       Cardiovascular hypertension, + CAD  Normal cardiovascular exam Rhythm:Regular Rate:Normal     Neuro/Psych  PSYCHIATRIC DISORDERS Anxiety Depression    negative neurological ROS     GI/Hepatic Neg liver ROS, hiatal hernia,GERD  ,,  Endo/Other  negative endocrine ROS    Renal/GU negative Renal ROS   Breast cancer    Musculoskeletal negative musculoskeletal ROS (+)    Abdominal   Peds negative pediatric ROS (+)  Hematology negative hematology ROS (+)   Anesthesia Other Findings   Reproductive/Obstetrics negative OB ROS                              Anesthesia Physical Anesthesia Plan  ASA: 3  Anesthesia Plan: General   Post-op Pain Management: Ofirmev  IV (intra-op)*   Induction: Intravenous  PONV Risk Score and Plan: 3 and Ondansetron , Dexamethasone , TIVA and Treatment may vary due to age or medical condition  Airway Management Planned: Oral ETT  Additional Equipment: None  Intra-op Plan:   Post-operative Plan: Extubation in OR  Informed Consent: I have reviewed the patients History and Physical, chart, labs and discussed the procedure including the risks, benefits and alternatives for the proposed anesthesia with the patient or authorized representative who has indicated his/her understanding and acceptance.     Dental advisory given  Plan Discussed with: CRNA  Anesthesia Plan Comments: (PAT note written 10/27/2023 by Kaly Mcquary, PA-C.   Patient is a 58 year old female scheduled for the above  procedure. Case was scheduled at [facility] but moved to [facility] OR due to BMI > 55.    History includes never smoker, HTN, hypercholesterolemia, aortic atherosclerosis, chronic DOE, GERD, hiatal hernia, OSA (uses BiPAP), morbid obesity, prediabetes, back pain, LE edema, hernia (s/p [facility] 09/14/20), left breast cancer (invasive mammary carcinoma 08/26/23) , anxiety.   PCP Dr. Chrystal had ordered a CT coronary calcium  score for HLD (LDL1 849 in July 2025). Patient contacted Dr. Chrystal in early July to let her know that breast cancer surgery planned and wanting to know about timing of test. Per notes in Care Everywhere, she was told your scan can definitely wait until after your surgery, it's not urgent at all. You can send us  a message when you are ready to schedule it, even if its months from now... She ended up getting the study done on 10/23/23, but the result is not yet available. She did have a non-ischemic stress echo in 2022. Known chronic DOE. She denied chest pain and SOB at PAT visit. 10/24/23 EKG showed NSR.   RSL date is not documented (date pending at time case was booked). She is post-menopausal. Anesthesia team to evaluate on the day of surgery.  )         Anesthesia Quick Evaluation

## 2023-10-28 ENCOUNTER — Other Ambulatory Visit: Payer: Self-pay | Admitting: Surgery

## 2023-10-28 ENCOUNTER — Ambulatory Visit
Admission: RE | Admit: 2023-10-28 | Discharge: 2023-10-28 | Disposition: A | Discharge status: Home / Self Care | Source: Ambulatory Visit | Attending: Surgery | Admitting: Surgery

## 2023-10-28 ENCOUNTER — Other Ambulatory Visit (HOSPITAL_BASED_OUTPATIENT_CLINIC_OR_DEPARTMENT_OTHER): Payer: Self-pay

## 2023-10-28 DIAGNOSIS — C50912 Malignant neoplasm of unspecified site of left female breast: Secondary | ICD-10-CM

## 2023-10-28 HISTORY — PX: BREAST BIOPSY: SHX20

## 2023-10-28 MED ORDER — LISINOPRIL-HYDROCHLOROTHIAZIDE 20-25 MG PO TABS
1.0000 | ORAL_TABLET | Freq: Every day | ORAL | 3 refills | Status: DC
  Filled 2023-10-28: qty 30, 30d supply, fill #0

## 2023-10-30 ENCOUNTER — Ambulatory Visit (HOSPITAL_COMMUNITY)
Admission: RE | Admit: 2023-10-30 | Discharge: 2023-10-30 | Disposition: A | Discharge status: Home / Self Care | Attending: Surgery | Admitting: Surgery

## 2023-10-30 ENCOUNTER — Ambulatory Visit (HOSPITAL_BASED_OUTPATIENT_CLINIC_OR_DEPARTMENT_OTHER): Admitting: Certified Registered Nurse Anesthetist

## 2023-10-30 ENCOUNTER — Ambulatory Visit (HOSPITAL_COMMUNITY): Admitting: Vascular Surgery

## 2023-10-30 ENCOUNTER — Encounter (HOSPITAL_COMMUNITY)
Admission: RE | Disposition: A | Discharge status: Home / Self Care | Payer: Self-pay | Source: Home / Self Care | Attending: Surgery

## 2023-10-30 ENCOUNTER — Encounter (HOSPITAL_COMMUNITY): Payer: Self-pay | Admitting: Surgery

## 2023-10-30 ENCOUNTER — Ambulatory Visit
Admission: RE | Admit: 2023-10-30 | Discharge: 2023-10-30 | Disposition: A | Discharge status: Home / Self Care | Source: Ambulatory Visit | Attending: Surgery | Admitting: Surgery

## 2023-10-30 ENCOUNTER — Other Ambulatory Visit: Payer: Self-pay

## 2023-10-30 DIAGNOSIS — G4730 Sleep apnea, unspecified: Secondary | ICD-10-CM | POA: Insufficient documentation

## 2023-10-30 DIAGNOSIS — F32A Depression, unspecified: Secondary | ICD-10-CM | POA: Diagnosis not present

## 2023-10-30 DIAGNOSIS — Z170 Estrogen receptor positive status [ER+]: Secondary | ICD-10-CM

## 2023-10-30 DIAGNOSIS — I10 Essential (primary) hypertension: Secondary | ICD-10-CM | POA: Insufficient documentation

## 2023-10-30 DIAGNOSIS — K449 Diaphragmatic hernia without obstruction or gangrene: Secondary | ICD-10-CM | POA: Insufficient documentation

## 2023-10-30 DIAGNOSIS — Z1721 Progesterone receptor positive status: Secondary | ICD-10-CM | POA: Insufficient documentation

## 2023-10-30 DIAGNOSIS — I2510 Atherosclerotic heart disease of native coronary artery without angina pectoris: Secondary | ICD-10-CM | POA: Diagnosis not present

## 2023-10-30 DIAGNOSIS — K219 Gastro-esophageal reflux disease without esophagitis: Secondary | ICD-10-CM | POA: Insufficient documentation

## 2023-10-30 DIAGNOSIS — Z7982 Long term (current) use of aspirin: Secondary | ICD-10-CM | POA: Insufficient documentation

## 2023-10-30 DIAGNOSIS — F418 Other specified anxiety disorders: Secondary | ICD-10-CM

## 2023-10-30 DIAGNOSIS — C50212 Malignant neoplasm of upper-inner quadrant of left female breast: Secondary | ICD-10-CM | POA: Diagnosis present

## 2023-10-30 DIAGNOSIS — Z79899 Other long term (current) drug therapy: Secondary | ICD-10-CM | POA: Diagnosis not present

## 2023-10-30 DIAGNOSIS — F419 Anxiety disorder, unspecified: Secondary | ICD-10-CM | POA: Diagnosis not present

## 2023-10-30 DIAGNOSIS — Z1732 Human epidermal growth factor receptor 2 negative status: Secondary | ICD-10-CM | POA: Diagnosis not present

## 2023-10-30 DIAGNOSIS — C50912 Malignant neoplasm of unspecified site of left female breast: Secondary | ICD-10-CM

## 2023-10-30 HISTORY — PX: SENTINEL NODE BIOPSY: SHX6608

## 2023-10-30 HISTORY — PX: BREAST LUMPECTOMY WITH RADIOACTIVE SEED AND SENTINEL LYMPH NODE BIOPSY: SHX6550

## 2023-10-30 SURGERY — BREAST LUMPECTOMY WITH RADIOACTIVE SEED AND SENTINEL LYMPH NODE BIOPSY
Anesthesia: General | Site: Breast | Laterality: Left

## 2023-10-30 MED ORDER — BUPIVACAINE-EPINEPHRINE (PF) 0.25% -1:200000 IJ SOLN
INTRAMUSCULAR | Status: AC
  Filled 2023-10-30: qty 30

## 2023-10-30 MED ORDER — CHLORHEXIDINE GLUCONATE CLOTH 2 % EX PADS: 6.0000 | MEDICATED_PAD | Freq: Once | CUTANEOUS | Status: DC

## 2023-10-30 MED ORDER — DEXAMETHASONE SODIUM PHOSPHATE 10 MG/ML IJ SOLN
INTRAMUSCULAR | Status: DC | PRN
  Administered 2023-10-30: 5 mg via INTRAVENOUS

## 2023-10-30 MED ORDER — FENTANYL CITRATE (PF) 250 MCG/5ML IJ SOLN
INTRAMUSCULAR | Status: AC
  Filled 2023-10-30: qty 5

## 2023-10-30 MED ORDER — HEMOSTATIC AGENTS (NO CHARGE) OPTIME
TOPICAL | Status: DC | PRN
  Administered 2023-10-30: 1 via TOPICAL

## 2023-10-30 MED ORDER — ACETAMINOPHEN 10 MG/ML IV SOLN: 1000.0000 mg | Freq: Once | INTRAVENOUS | Status: DC | PRN

## 2023-10-30 MED ORDER — DEXAMETHASONE SODIUM PHOSPHATE 10 MG/ML IJ SOLN
INTRAMUSCULAR | Status: AC
  Filled 2023-10-30: qty 1

## 2023-10-30 MED ORDER — FENTANYL CITRATE (PF) 250 MCG/5ML IJ SOLN
INTRAMUSCULAR | Status: DC | PRN
  Administered 2023-10-30 (×4): 50 ug via INTRAVENOUS

## 2023-10-30 MED ORDER — FENTANYL CITRATE (PF) 100 MCG/2ML IJ SOLN
25.0000 ug | INTRAMUSCULAR | Status: DC | PRN
  Administered 2023-10-30: 50 ug via INTRAVENOUS
  Administered 2023-10-30: 25 ug via INTRAVENOUS
  Administered 2023-10-30: 50 ug via INTRAVENOUS
  Administered 2023-10-30: 25 ug via INTRAVENOUS

## 2023-10-30 MED ORDER — BUPIVACAINE-EPINEPHRINE 0.25% -1:200000 IJ SOLN
INTRAMUSCULAR | Status: DC | PRN
  Administered 2023-10-30: 28 mL

## 2023-10-30 MED ORDER — MAGTRACE LYMPHATIC TRACER
INTRAMUSCULAR | Status: DC | PRN
  Administered 2023-10-30: 2 mL via INTRAMUSCULAR

## 2023-10-30 MED ORDER — ONDANSETRON HCL 4 MG/2ML IJ SOLN
INTRAMUSCULAR | Status: AC
  Filled 2023-10-30: qty 2

## 2023-10-30 MED ORDER — PROPOFOL 10 MG/ML IV BOLUS
INTRAVENOUS | Status: AC
  Filled 2023-10-30: qty 20

## 2023-10-30 MED ORDER — CHLORHEXIDINE GLUCONATE 0.12 % MT SOLN
15.0000 mL | Freq: Once | OROMUCOSAL | Status: AC
  Administered 2023-10-30: 15 mL via OROMUCOSAL
  Filled 2023-10-30: qty 15

## 2023-10-30 MED ORDER — ONDANSETRON HCL 4 MG/2ML IJ SOLN
INTRAMUSCULAR | Status: DC | PRN
  Administered 2023-10-30: 4 mg via INTRAVENOUS

## 2023-10-30 MED ORDER — MIDAZOLAM HCL 2 MG/2ML IJ SOLN
INTRAMUSCULAR | Status: AC
  Filled 2023-10-30: qty 2

## 2023-10-30 MED ORDER — 0.9 % SODIUM CHLORIDE (POUR BTL) OPTIME
TOPICAL | Status: DC | PRN
  Administered 2023-10-30: 1000 mL

## 2023-10-30 MED ORDER — FENTANYL CITRATE (PF) 100 MCG/2ML IJ SOLN
INTRAMUSCULAR | Status: AC
Start: 2023-10-30 — End: 2023-10-30
  Filled 2023-10-30: qty 2

## 2023-10-30 MED ORDER — LACTATED RINGERS IV SOLN: INTRAVENOUS | Status: DC

## 2023-10-30 MED ORDER — FENTANYL CITRATE (PF) 100 MCG/2ML IJ SOLN
INTRAMUSCULAR | Status: AC
  Filled 2023-10-30: qty 2

## 2023-10-30 MED ORDER — DROPERIDOL 2.5 MG/ML IJ SOLN
INTRAMUSCULAR | Status: AC
  Filled 2023-10-30: qty 2

## 2023-10-30 MED ORDER — IBUPROFEN 800 MG PO TABS: 800.0000 mg | ORAL_TABLET | Freq: Three times a day (TID) | ORAL | 0 refills | Status: DC | PRN

## 2023-10-30 MED ORDER — LIDOCAINE 2% (20 MG/ML) 5 ML SYRINGE
INTRAMUSCULAR | Status: AC
  Filled 2023-10-30: qty 5

## 2023-10-30 MED ORDER — OXYCODONE HCL 5 MG/5ML PO SOLN: 5.0000 mg | Freq: Once | ORAL | Status: AC | PRN

## 2023-10-30 MED ORDER — LIDOCAINE 2% (20 MG/ML) 5 ML SYRINGE
INTRAMUSCULAR | Status: DC | PRN
  Administered 2023-10-30: 100 mg via INTRAVENOUS

## 2023-10-30 MED ORDER — SUCCINYLCHOLINE CHLORIDE 200 MG/10ML IV SOSY
PREFILLED_SYRINGE | INTRAVENOUS | Status: DC | PRN
  Administered 2023-10-30: 160 mg via INTRAVENOUS

## 2023-10-30 MED ORDER — DROPERIDOL 2.5 MG/ML IJ SOLN
0.6250 mg | Freq: Once | INTRAMUSCULAR | Status: AC | PRN
  Administered 2023-10-30: 0.625 mg via INTRAVENOUS

## 2023-10-30 MED ORDER — SUCCINYLCHOLINE CHLORIDE 200 MG/10ML IV SOSY
PREFILLED_SYRINGE | INTRAVENOUS | Status: AC
  Filled 2023-10-30: qty 10

## 2023-10-30 MED ORDER — OXYCODONE HCL 5 MG PO TABS
ORAL_TABLET | ORAL | Status: AC
  Filled 2023-10-30: qty 1

## 2023-10-30 MED ORDER — OXYCODONE HCL 5 MG PO TABS
5.0000 mg | ORAL_TABLET | Freq: Once | ORAL | Status: AC | PRN
  Administered 2023-10-30: 5 mg via ORAL

## 2023-10-30 MED ORDER — BUPIVACAINE HCL (PF) 0.25 % IJ SOLN
INTRAMUSCULAR | Status: DC | PRN
  Administered 2023-10-30: 30 mL via EPIDURAL

## 2023-10-30 MED ORDER — CEFAZOLIN SODIUM-DEXTROSE 3-4 GM/150ML-% IV SOLN
3.0000 g | INTRAVENOUS | Status: AC
  Administered 2023-10-30: 3 g via INTRAVENOUS
  Filled 2023-10-30: qty 150

## 2023-10-30 MED ORDER — PROPOFOL 10 MG/ML IV BOLUS
INTRAVENOUS | Status: DC | PRN
  Administered 2023-10-30: 200 mg via INTRAVENOUS

## 2023-10-30 MED ORDER — ORAL CARE MOUTH RINSE: 15.0000 mL | Freq: Once | OROMUCOSAL | Status: AC

## 2023-10-30 MED ORDER — MIDAZOLAM HCL 5 MG/5ML IJ SOLN
INTRAMUSCULAR | Status: DC | PRN
  Administered 2023-10-30 (×2): 1 mg via INTRAVENOUS

## 2023-10-30 MED ORDER — OXYCODONE HCL 5 MG PO TABS: 5.0000 mg | ORAL_TABLET | Freq: Four times a day (QID) | ORAL | 0 refills | Status: DC | PRN

## 2023-10-30 SURGICAL SUPPLY — 39 items
BAG COUNTER SPONGE SURGICOUNT (BAG) IMPLANT
BINDER BREAST 3XL (GAUZE/BANDAGES/DRESSINGS) IMPLANT
BINDER BREAST LRG (GAUZE/BANDAGES/DRESSINGS) IMPLANT
BINDER BREAST XLRG (GAUZE/BANDAGES/DRESSINGS) IMPLANT
CANISTER SUCTION 3000ML PPV (SUCTIONS) ×2 IMPLANT
CHLORAPREP W/TINT 26 (MISCELLANEOUS) ×2 IMPLANT
CLIP APPLIE 9.375 MED OPEN (MISCELLANEOUS) ×2 IMPLANT
CNTNR URN SCR LID CUP LEK RST (MISCELLANEOUS) IMPLANT
COVER PROBE W GEL 5X96 (DRAPES) ×2 IMPLANT
COVER SURGICAL LIGHT HANDLE (MISCELLANEOUS) ×2 IMPLANT
DERMABOND ADVANCED .7 DNX12 (GAUZE/BANDAGES/DRESSINGS) ×2 IMPLANT
DEVICE DUBIN SPECIMEN MAMMOGRA (MISCELLANEOUS) ×2 IMPLANT
DRAPE CHEST BREAST 15X10 FENES (DRAPES) ×2 IMPLANT
ELECT CAUTERY BLADE 6.4 (BLADE) ×2 IMPLANT
ELECTRODE REM PT RTRN 9FT ADLT (ELECTROSURGICAL) ×2 IMPLANT
GAUZE PAD ABD 8X10 STRL (GAUZE/BANDAGES/DRESSINGS) ×2 IMPLANT
GLOVE BIO SURGEON STRL SZ8 (GLOVE) ×2 IMPLANT
GLOVE BIOGEL PI IND STRL 8 (GLOVE) ×2 IMPLANT
GOWN STRL REUS W/ TWL LRG LVL3 (GOWN DISPOSABLE) ×2 IMPLANT
GOWN STRL REUS W/ TWL XL LVL3 (GOWN DISPOSABLE) ×2 IMPLANT
HEMOSTAT ARISTA ABSORB 3G PWDR (HEMOSTASIS) IMPLANT
KIT BASIN OR (CUSTOM PROCEDURE TRAY) ×2 IMPLANT
KIT MARKER MARGIN INK (KITS) ×2 IMPLANT
LIGHT WAVEGUIDE WIDE FLAT (MISCELLANEOUS) IMPLANT
NDL 18GX1X1/2 (RX/OR ONLY) (NEEDLE) IMPLANT
NDL FILTER BLUNT 18X1 1/2 (NEEDLE) IMPLANT
NDL HYPO 25GX1X1/2 BEV (NEEDLE) ×2 IMPLANT
NEEDLE 18GX1X1/2 (RX/OR ONLY) (NEEDLE) IMPLANT
NEEDLE FILTER BLUNT 18X1 1/2 (NEEDLE) ×2 IMPLANT
NEEDLE HYPO 25GX1X1/2 BEV (NEEDLE) ×4 IMPLANT
NS IRRIG 1000ML POUR BTL (IV SOLUTION) ×2 IMPLANT
PACK GENERAL/GYN (CUSTOM PROCEDURE TRAY) ×2 IMPLANT
PAD ABD 8X10 STRL (GAUZE/BANDAGES/DRESSINGS) IMPLANT
SUT MNCRL AB 4-0 PS2 18 (SUTURE) ×2 IMPLANT
SUT VIC AB 3-0 SH 18 (SUTURE) ×2 IMPLANT
SYR 3ML LL SCALE MARK (SYRINGE) IMPLANT
SYR CONTROL 10ML LL (SYRINGE) ×2 IMPLANT
TOWEL GREEN STERILE (TOWEL DISPOSABLE) ×2 IMPLANT
TOWEL GREEN STERILE FF (TOWEL DISPOSABLE) ×2 IMPLANT

## 2023-10-30 NOTE — Discharge Instructions (Signed)

## 2023-10-30 NOTE — Anesthesia Procedure Notes (Signed)
 Procedure Name: Intubation Date/Time: 10/30/2023 7:43 AM  Performed by: Claudene Arlin LABOR, CRNAPre-anesthesia Checklist: Patient identified, Emergency Drugs available, Suction available and Patient being monitored Patient Re-evaluated:Patient Re-evaluated prior to induction Oxygen Delivery Method: Circle system utilized Preoxygenation: Pre-oxygenation with 100% oxygen Induction Type: IV induction and Rapid sequence Laryngoscope Size: Glidescope and 3 Grade View: Grade I Tube type: Oral Tube size: 7.0 mm Number of attempts: 1 Airway Equipment and Method: Rigid stylet and Video-laryngoscopy Placement Confirmation: ETT inserted through vocal cords under direct vision, positive ETCO2 and breath sounds checked- equal and bilateral Secured at: 21 cm Tube secured with: Tape Dental Injury: Teeth and Oropharynx as per pre-operative assessment  Difficulty Due To: Difficulty was anticipated, Difficult Airway- due to large tongue, Difficult Airway- due to reduced neck mobility and Difficult Airway- due to limited oral opening Future Recommendations: Recommend- induction with short-acting agent, and alternative techniques readily available

## 2023-10-30 NOTE — Op Note (Signed)
 Preoperative diagnosis: Stage I left breast cancer upper inner quadrant ER positive  Postoperative diagnosis: Same  Procedure: Left breast seed localized lumpectomy with injection of mag trace and deep left axillary sentinel lymph node mapping  Surgeon: Debby Shipper, MD  Anesthesia: LMA with 0.25% Marcaine  with epinephrine  and pectoral block per anesthesia protocol  Drains: None  EBL: 10 cc  Specimen: Left breast tissue with seed and clip verified by Faxitron, additional left anterior lateral margin, 3 left axillary deep sentinel nodes  Indications for procedure: The patient is a 58 year old female with stage I left breast cancer.  She was seen in the multidisciplinary clinic and opted for breast conserving surgery after reviewing all of her medical / surgical options. The procedure has been discussed with the patient. Alternatives to surgery have been discussed with the patient.  Risks of surgery include bleeding,  Infection,  Seroma formation, death,  and the need for further surgery.   The patient understands and wishes to proceed. Sentinel lymph node mapping and dissection has been discussed with the patient.  Risk of bleeding,  Infection,  Seroma formation,  Additional procedures, lymphedema,  Shoulder weakness ,  Shoulder stiffness,  Nerve and blood vessel injury and reaction to the mapping dyes have been discussed.  Alternatives to surgery have been discussed with the patient.  The patient agrees to proceed.    Description of procedure: The patient was met in the holding area and all questions were answered.  The left breast was marked as the correct site.  Of note, she underwent seed placement as an outpatient in her left breast and films were available for review.  A left pectoral block was administered by anesthesia staff.  She was taken back to the operating room placed supine upon the operating room table.  After induction of general anesthesia, under sterile conditions 2 cc of mag  trace were injected into the deep left breast massaged for 5 minutes.  The left breast was then prepped and draped in a sterile fashion and a second timeout performed.  Neoprobe was used to identify the seed in the upper inner quadrant.  A transverse incision was made of the signal in all tissue and the seed and clip were excised with a grossly negative margin.  Upon reviewing the imaging, I felt the anterior lateral margin was close therefore this was excised separately.  All tissue was oriented with ink.  Hemostasis was achieved.  Clips were used to mark the cavity.  Irrigation was used and suctioned out.  There is no bleeding.  The deep tissue planes were approximated with 3-0 Vicryl.  4 Monocryl was used to close the skin.  The mag trace probe was then used.  An area of increased activity was detected in the left axilla.  A 5 cm incision was made along the inferior border of the axillary hairline on the left and dissection was carried down into the deep left Eksir contents level 1.  There were 3 nodes that had slight uptake of the tracer.  The background counts though showed no spike whatsoever after removal of these nodes.  The long thoracic nerve, thoracodorsal trunk and axillary vein were all preserved.  Hemostasis achieved with cautery and Arista.  Irrigation was used prior to this there is no signs of any bleeding.  Local anesthetic was infiltrated throughout the subcu tissues.  The deep tissue planes were approximated with 3-0 Vicryl.  4 Monocryl was used to close the skin.  Dermabond applied to both.  Breast binder placed.  All counts found to be correct.  The patient was awoke extubated taken to recovery in satisfactory condition.

## 2023-10-30 NOTE — Transfer of Care (Signed)
 Immediate Anesthesia Transfer of Care Note  Patient: Chelsea Soto  Procedure(s) Performed: BREAST LUMPECTOMY WITH RADIOACTIVE SEED (Left: Breast) BIOPSY, LYMPH NODE, SENTINEL (Left: Axilla)  Patient Location: PACU  Anesthesia Type:GA combined with regional for post-op pain  Level of Consciousness: awake, alert, oriented  Airway & Oxygen Therapy: Patient Spontanous Breathing and Patient connected to face mask oxygen  Post-op Assessment: Report given to RN and Post -op Vital signs reviewed and stable  Post vital signs: Reviewed and stable  Last Vitals:  Vitals Value Taken Time  BP    Temp    Pulse 87 10/30/23 09:28  Resp 15 10/30/23 09:28  SpO2 96 % 10/30/23 09:28  Vitals shown include unfiled device data.  Last Pain:  Vitals:   10/30/23 0619  TempSrc:   PainSc: 0-No pain         Complications: No notable events documented.

## 2023-10-30 NOTE — Anesthesia Procedure Notes (Signed)
 Anesthesia Regional Block: Pectoralis block   Pre-Anesthetic Checklist: , timeout performed,  Correct Patient, Correct Site, Correct Laterality,  Correct Procedure, Correct Position, site marked,  Risks and benefits discussed,  Surgical consent,  Pre-op evaluation,  At surgeon's request and post-op pain management  Laterality: Left  Prep: chloraprep       Needles:  Injection technique: Single-shot  Needle Type: Echogenic Stimulator Needle     Needle Length: 9cm  Needle Gauge: 21     Additional Needles:   Procedures:,,,, ultrasound used (permanent image in chart),,    Narrative:  Start time: 10/30/2023 7:10 AM End time: 10/30/2023 7:20 AM Injection made incrementally with aspirations every 5 mL.  Performed by: Personally  Anesthesiologist: Erma Thom SAUNDERS, MD  Additional Notes: Discussed risks and benefits of the nerve block in detail, including but not limited vascular injury, permanent nerve damage and infection.   Patient tolerated the procedure well. Local anesthetic introduced in an incremental fashion under minimal resistance after negative aspirations. No paresthesias were elicited. After completion of the procedure, no acute issues were identified and patient continued to be monitored by RN.

## 2023-10-30 NOTE — Interval H&P Note (Signed)
 History and Physical Interval Note:  10/30/2023 7:14 AM  Chelsea Soto Pouch  has presented today for surgery, with the diagnosis of LEFT BREAST CANCER.  The various methods of treatment have been discussed with the patient and family. After consideration of risks, benefits and other options for treatment, the patient has consented to  Procedure(s) with comments: BREAST LUMPECTOMY WITH RADIOACTIVE SEED AND SENTINEL LYMPH NODE BIOPSY (Left) - LEFT BREAST SEED LUMPECTOMY LEFT AXILLARY SENTINEL LYMPH NODE MAPPING as a surgical intervention.  The patient's history has been reviewed, patient examined, no change in status, stable for surgery.  I have reviewed the patient's chart and labs.  Questions were answered to the patient's satisfaction.     Anshu Wehner A Ndia Sampath

## 2023-10-31 ENCOUNTER — Other Ambulatory Visit (HOSPITAL_BASED_OUTPATIENT_CLINIC_OR_DEPARTMENT_OTHER): Payer: Self-pay

## 2023-10-31 ENCOUNTER — Encounter (HOSPITAL_COMMUNITY): Payer: Self-pay | Admitting: Surgery

## 2023-10-31 NOTE — Anesthesia Postprocedure Evaluation (Signed)
 Anesthesia Post Note  Patient: Chelsea Soto  Procedure(s) Performed: BREAST LUMPECTOMY WITH RADIOACTIVE SEED (Left: Breast) BIOPSY, LYMPH NODE, SENTINEL (Left: Axilla)     Patient location during evaluation: PACU Anesthesia Type: General Level of consciousness: awake and alert Pain management: pain level controlled Vital Signs Assessment: post-procedure vital signs reviewed and stable Respiratory status: spontaneous breathing, nonlabored ventilation, respiratory function stable and patient connected to nasal cannula oxygen Cardiovascular status: blood pressure returned to baseline and stable Postop Assessment: no apparent nausea or vomiting Anesthetic complications: no   No notable events documented.  Last Vitals:  Vitals:   10/30/23 1015 10/30/23 1030  BP: (!) 152/78 (!) 147/78  Pulse: 75 77  Resp: 19 (!) 22  Temp:    SpO2: 91% 94%    Last Pain:  Vitals:   10/30/23 1023  TempSrc:   PainSc: 4                  Chelsea Soto

## 2023-11-03 ENCOUNTER — Ambulatory Visit: Payer: Self-pay | Admitting: Surgery

## 2023-11-03 LAB — SURGICAL PATHOLOGY

## 2023-11-04 NOTE — Therapy (Signed)
 OUTPATIENT PHYSICAL THERAPY BREAST CANCER POST OP FOLLOW UP   Patient Name: Chelsea Soto MRN: [id number] DOB:1965/10/27, 58 y.o., female Today's Date: 11/06/2023  END OF SESSION:  PT End of Session - 11/06/23 1404     Visit Number 2    Number of Visits 10    Date for PT Re-Evaluation 12/04/23    PT Start Time 1403    PT Stop Time 1430    PT Time Calculation (min) 27 min    Activity Tolerance Patient tolerated treatment well    Behavior During Therapy WFL for tasks assessed/performed          Past Medical History:  Diagnosis Date   Anxiety    Aortic atherosclerosis (HCC)    Back pain    Breast cancer (HCC)    Constipation    Depression    Diverticulosis    Dyspnea    chronic (with exertion)   Edema of both lower extremities    GERD (gastroesophageal reflux disease)    Hiatal hernia    Hip pain    Hypercholesterolemia    Hypertension    Joint pain    Knee pain    Mood swings    Morbid obesity with BMI of 50.0-59.9, adult (HCC)    Post-menopausal bleeding    Prediabetes    Sleep apnea    Severe, on BiPAP   SOB (shortness of breath)    Umbilical hernia    Ventral hernia    Ventral hernia    Vitamin D  deficiency    Wears contact lenses    Past Surgical History:  Procedure Laterality Date   BREAST BIOPSY Left 08/26/2023   US  LT BREAST BX W LOC DEV 1ST LESION IMG BX SPEC US  GUIDE 08/26/2023 [facility]   BREAST BIOPSY Left 10/28/2023   US  LT RADIOACTIVE SEED LOC 10/28/2023 [facility]   BREAST LUMPECTOMY WITH RADIOACTIVE SEED AND SENTINEL LYMPH NODE BIOPSY Left 10/30/2023   Procedure: BREAST LUMPECTOMY WITH RADIOACTIVE SEED;  Surgeon: Vanderbilt Ned, MD;  Location: [facility] OR;  Service: General;  Laterality: Left;  LEFT BREAST SEED LUMPECTOMY LEFT AXILLARY SENTINEL LYMPH NODE MAPPING   COLONOSCOPY     COLONOSCOPY WITH PROPOFOL  N/A 03/20/2023   Procedure: COLONOSCOPY WITH PROPOFOL ;  Surgeon: Albertus Gordy HERO, MD;  Location: [facility] ENDOSCOPY;  Service:  Gastroenterology;  Laterality: N/A;   DILATATION & CURETTAGE/HYSTEROSCOPY WITH MYOSURE N/A 01/21/2019   Procedure: DILATATION & CURETTAGE/HYSTEROSCOPY WITH possible  MYOSURE;  Surgeon: Latisha Medford, MD;  Location: [facility] OR;  Service: Gynecology;  Laterality: N/A;   LAPAROSCOPY N/A 09/14/2020   Procedure: DIAGNOSTIC LAPAROSCOPY;  Surgeon: Teresa Lonni HERO, MD;  Location: [facility] OR;  Service: General;  Laterality: N/A;   LYSIS OF ADHESION N/A 09/14/2020   Procedure: LYSIS OF ADHESION;  Surgeon: Teresa Lonni HERO, MD;  Location: [facility] OR;  Service: General;  Laterality: N/A;   POLYPECTOMY  03/20/2023   Procedure: POLYPECTOMY;  Surgeon: Albertus Gordy HERO, MD;  Location: [facility] ENDOSCOPY;  Service: Gastroenterology;;   SENTINEL NODE BIOPSY Left 10/30/2023   Procedure: BIOPSY, LYMPH NODE, SENTINEL;  Surgeon: Vanderbilt Ned, MD;  Location: [facility] OR;  Service: General;  Laterality: Left;   VENTRAL HERNIA REPAIR N/A 09/14/2020   Procedure: OPEN REPAIR OF INCARCERATED VENTRAL HERNIA  ADULT;  Surgeon: Teresa Lonni HERO, MD;  Location: [facility] OR;  Service: General;  Laterality: N/A;   WISDOM TOOTH EXTRACTION     Patient Active Problem List   Diagnosis Date Noted   BRIP1 gene mutation  positive 09/22/2023   Genetic testing 09/15/2023   Malignant neoplasm of upper-inner quadrant of left breast in female, estrogen receptor positive (HCC) 09/01/2023   OSA on CPAP 05/01/2023   Positive colorectal cancer screening using Cologuard test 03/20/2023   Benign neoplasm of transverse colon 03/20/2023   Benign neoplasm of descending colon 03/20/2023   Benign neoplasm of sigmoid colon 03/20/2023   Benign neoplasm of rectum 03/20/2023   Constipation 11/12/2022   Coronary artery calcification seen on CAT scan 08/06/2022   Mixed hyperlipidemia 08/06/2022   Primary hypertension 08/06/2022   Class 3 severe obesity with serious comorbidity and body mass index (BMI) of 50.0 to 59.9 in adult 08/06/2022   Prediabetes 08/06/2022    Other fatigue 08/06/2022   SOB (shortness of breath) on exertion 08/06/2022   Incarcerated hernia 09/14/2020    PCP: Lamarr Rotunda MD  REFERRING PROVIDER: Dr. Debby Shipper   REFERRING DIAG: Left breast cancer  THERAPY DIAG:  Stiffness of left shoulder, not elsewhere classified  Abnormal posture  Malignant neoplasm of upper-inner quadrant of left breast in female, estrogen receptor positive (HCC)  Rationale for Evaluation and Treatment: Rehabilitation  ONSET DATE: 07/08/23  SUBJECTIVE:                                                                                                                                                                                           SUBJECTIVE STATEMENT: I am having pain under my arm. I just saw the surgeon's PA and I was prescribed antibiotics for an infection. It started the day before yesterday.  PERTINENT HISTORY:  Patient was diagnosed on 07/08/2023 with left grade 2 invasive ductal carcinoma breast cancer. It measures 8 mm and is located in the upper inner quadrant. It is ER/PR positive and HER2 negative with a Ki67 of 2%. 10/30/23- L breast lumpectomy and SLNB 0/2  PATIENT GOALS:  Reassess how my recovery is going related to arm function, pain, and swelling.  PAIN:  Are you having pain? Yes: NPRS scale: 3/10 Pain location: L axilla Pain description: tender Aggravating factors: clothing rubbing it Relieving factors: ibuprofen   PRECAUTIONS: Recent Surgery, left UE Lymphedema risk,   RED FLAGS: None   ACTIVITY LEVEL / LEISURE: walking on treadmill, 3x/wk for 30-35 min   OBJECTIVE:   PATIENT SURVEYS:  QUICK DASH:  Quick Dash - 11/06/23 0001     Open a tight or new jar Mild difficulty    Do heavy household chores (wash walls, wash floors) Moderate difficulty    Carry a shopping bag or briefcase No difficulty    Wash your back Moderate difficulty    Use a  knife to cut food No difficulty    Recreational activities in which  you take some force or impact through your arm, shoulder, or hand (golf, hammering, tennis) Mild difficulty    During the past week, to what extent has your arm, shoulder or hand problem interfered with your normal social activities with family, friends, neighbors, or groups? Modererately    During the past week, to what extent has your arm, shoulder or hand problem limited your work or other regular daily activities Not at all    Arm, shoulder, or hand pain. None    Tingling (pins and needles) in your arm, shoulder, or hand None    Difficulty Sleeping Moderate difficulty    DASH Score 22.73 %           OBSERVATIONS: Healing lumpectomy scar with glue intact, healing SLNB scar with redness surrounding it due to infection which pt was just prescribed antibiotics  POSTURE:  Forward head and rounded shoulders posture   UPPER EXTREMITY AROM/PROM:   A/PROM RIGHT   eval    Shoulder extension 55  Shoulder flexion 138  Shoulder abduction 137  Shoulder internal rotation 77  Shoulder external rotation 78                          (Blank rows = not tested)   A/PROM LEFT   Eval 09/03/23 LEFT 11/06/23  Shoulder extension 63 58  Shoulder flexion 123 130  Shoulder abduction 145 110  Shoulder internal rotation 87 75  Shoulder external rotation 78 85                          (Blank rows = not tested)   CERVICAL AROM: All within normal limits   UPPER EXTREMITY STRENGTH: WNL   LYMPHEDEMA ASSESSMENTS (in cm):    LANDMARK RIGHT   eval  10 cm proximal to olecranon process 38.3  Olecranon process 28.7  10 cm proximal to ulnar styloid process 26.3  Just proximal to ulnar styloid process 16.6  Across hand at thumb web space 19.6  At base of 2nd digit 6.5  (Blank rows = not tested)   LANDMARK LEFT   Eval 09/03/23 LEFT 11/06/23  10 cm proximal to olecranon process 40.1 39.6  Olecranon process 28.9 28.5  10 cm proximal to ulnar styloid process 25.6 24.6  Just proximal to ulnar styloid  process 17 17.6  Across hand at thumb web space 19.5 19.8  At base of 2nd digit 6.6 6.1  (Blank rows = not tested)  Surgery type/Date: 10/30/23- L breast lumpectomy and SLNB Number of lymph nodes removed: 0/2 Current/past treatment (chemo, radiation, hormone therapy): will require radiation and hormone therapy  Other symptoms:  Heaviness/tightness No Pain Yes Pitting edema No Infections Yes currently infection in L axilla Decreased scar mobility Yes healing scars Stemmer sign No  PATIENT EDUCATION:  Education details: ABC class, lymphedema risk reduction, post op exercises, scar mobilization Person educated: Patient Education method: Explanation and Handouts Education comprehension: verbalized understanding  HOME EXERCISE PROGRAM: Reviewed previously given post op HEP.   ASSESSMENT:  CLINICAL IMPRESSION: Pt returns to PT after undergoing a L lumpectomy and SLNB on 10/30/23. She developed an infection at the lymph node biopsy site and was just prescribed antibiotics prior to this appointment. Her L shoulder ROM was limited today. She would benefit from skilled PT services to improve L shoulder ROM and to be instructed in  the Strength ABC program.   Pt will benefit from skilled therapeutic intervention to improve on the following deficits: Decreased knowledge of precautions, impaired UE functional use, pain, decreased ROM, postural dysfunction.   PT treatment/interventions: ADL/Self care home management, [phone]- PT Re-evaluation, 97110-Therapeutic exercises, 97530- Therapeutic activity, [id number]- Neuromuscular re-education, 97535- Self Care, [id number]- Manual therapy, [id number]- Orthotic Initial, and [id number]- Orthotic/Prosthetic subsequent   GOALS: Goals reviewed with patient? Yes  LONG TERM GOALS:  (STG=LTG)  GOALS Name Target Date  Goal status  1 Pt will demonstrate she has regained full shoulder ROM and function post operatively compared to baselines.  Baseline: 12/04/23 INITIAL  2 Pt  will be independent in Strength ABC program for continued stretching and strengthening.  12/04/23 INITIAL     PLAN:  PT FREQUENCY/DURATION: 2x/wk for 4 weeks  PLAN FOR NEXT SESSION: remeasure ROM, PROM/AAROM/AROM exercises, teach Strength ABC program, re print post op handout and give to pt   [facility]  [address], [address]  [place] [address]  [phone]  After Breast Cancer Class Video It is recommended you view the ABC class video to be educated on lymphedema risk reduction. This video lasts for about 30 minutes. It can be viewed on our website here: [email or web address]  Scar massage You can begin gentle scar massage to you incision sites. Gently place one hand on the incision and move the skin (without sliding on the skin) in various directions. Do this for a few minutes and then you can gently massage either coconut oil or vitamin E cream into the scars.  Compression garment You should continue wearing your compression bra until you feel like you no longer have swelling.  Home exercise Program Continue doing the exercises you were given until you feel like you can do them without feeling any tightness at the end.   Walking Program Studies show that 30 minutes of walking per day (fast enough to elevate your heart rate) can significantly reduce the risk of a cancer recurrence. If you can't walk due to other medical reasons, we encourage you to find another activity you could do (like a stationary bike or water exercise).  Posture After breast cancer surgery, people frequently sit with rounded shoulders posture because it puts their incisions on slack and feels better. If you sit like this and scar tissue forms in that position, you can become very tight and have pain sitting or standing with good posture. Try to be aware of your posture and sit and stand up tall to heal  properly.  Follow up PT: It is recommended you return every 3 months for the first 3 years following surgery to be assessed on the SOZO machine for an L-Dex score. This helps prevent clinically significant lymphedema in 95% of patients. These follow up screens are 10 minute appointments that you are not billed for.  [facility] [place], PT 11/06/2023, 2:36 PM

## 2023-11-05 ENCOUNTER — Encounter: Payer: Self-pay | Admitting: *Deleted

## 2023-11-05 ENCOUNTER — Telehealth: Payer: Self-pay | Admitting: *Deleted

## 2023-11-05 NOTE — Telephone Encounter (Signed)
 Ordered oncotype per Dr. Pamelia Hoit. Sent requisition to pathology and exact sciences.

## 2023-11-06 ENCOUNTER — Ambulatory Visit: Attending: Surgery | Admitting: Physical Therapy

## 2023-11-06 ENCOUNTER — Encounter: Payer: Self-pay | Admitting: Physical Therapy

## 2023-11-06 DIAGNOSIS — Z170 Estrogen receptor positive status [ER+]: Secondary | ICD-10-CM | POA: Insufficient documentation

## 2023-11-06 DIAGNOSIS — C50212 Malignant neoplasm of upper-inner quadrant of left female breast: Secondary | ICD-10-CM | POA: Diagnosis present

## 2023-11-06 DIAGNOSIS — M25612 Stiffness of left shoulder, not elsewhere classified: Secondary | ICD-10-CM | POA: Diagnosis present

## 2023-11-06 DIAGNOSIS — R293 Abnormal posture: Secondary | ICD-10-CM | POA: Insufficient documentation

## 2023-11-10 ENCOUNTER — Inpatient Hospital Stay: Admitting: Hematology and Oncology

## 2023-11-10 ENCOUNTER — Other Ambulatory Visit (HOSPITAL_BASED_OUTPATIENT_CLINIC_OR_DEPARTMENT_OTHER): Payer: Self-pay

## 2023-11-10 MED ORDER — ROSUVASTATIN CALCIUM 10 MG PO TABS
10.0000 mg | ORAL_TABLET | Freq: Every day | ORAL | 0 refills | Status: DC
  Filled 2023-11-10: qty 30, 30d supply, fill #0
  Filled 2023-12-22: qty 30, 30d supply, fill #1
  Filled 2024-02-02: qty 30, 30d supply, fill #2

## 2023-11-14 ENCOUNTER — Encounter: Payer: Self-pay | Admitting: *Deleted

## 2023-11-14 ENCOUNTER — Encounter (HOSPITAL_COMMUNITY): Payer: Self-pay

## 2023-11-14 ENCOUNTER — Telehealth: Payer: Self-pay | Admitting: *Deleted

## 2023-11-14 DIAGNOSIS — C50212 Malignant neoplasm of upper-inner quadrant of left female breast: Secondary | ICD-10-CM

## 2023-11-14 NOTE — Telephone Encounter (Signed)
 Received oncotype results 6/3%. Patient aware. Referral placed for Dr. Dewey

## 2023-11-18 ENCOUNTER — Other Ambulatory Visit: Payer: Self-pay

## 2023-11-18 ENCOUNTER — Emergency Department (HOSPITAL_COMMUNITY)

## 2023-11-18 ENCOUNTER — Inpatient Hospital Stay (HOSPITAL_COMMUNITY)
Admission: EM | Admit: 2023-11-18 | Discharge: 2023-11-21 | DRG: 394 | Disposition: A | Discharge status: Home / Self Care | Attending: Internal Medicine | Admitting: Internal Medicine

## 2023-11-18 ENCOUNTER — Ambulatory Visit: Payer: Self-pay | Admitting: Physical Therapy

## 2023-11-18 ENCOUNTER — Encounter: Payer: Self-pay | Admitting: Physical Therapy

## 2023-11-18 ENCOUNTER — Encounter (HOSPITAL_COMMUNITY): Payer: Self-pay

## 2023-11-18 DIAGNOSIS — Z1152 Encounter for screening for COVID-19: Secondary | ICD-10-CM

## 2023-11-18 DIAGNOSIS — K436 Other and unspecified ventral hernia with obstruction, without gangrene: Principal | ICD-10-CM | POA: Diagnosis present

## 2023-11-18 DIAGNOSIS — K439 Ventral hernia without obstruction or gangrene: Secondary | ICD-10-CM

## 2023-11-18 DIAGNOSIS — F419 Anxiety disorder, unspecified: Secondary | ICD-10-CM | POA: Diagnosis present

## 2023-11-18 DIAGNOSIS — Z79899 Other long term (current) drug therapy: Secondary | ICD-10-CM

## 2023-11-18 DIAGNOSIS — Z853 Personal history of malignant neoplasm of breast: Secondary | ICD-10-CM

## 2023-11-18 DIAGNOSIS — K219 Gastro-esophageal reflux disease without esophagitis: Secondary | ICD-10-CM | POA: Diagnosis present

## 2023-11-18 DIAGNOSIS — E872 Acidosis, unspecified: Secondary | ICD-10-CM | POA: Diagnosis present

## 2023-11-18 DIAGNOSIS — K5730 Diverticulosis of large intestine without perforation or abscess without bleeding: Secondary | ICD-10-CM | POA: Diagnosis present

## 2023-11-18 DIAGNOSIS — E66813 Obesity, class 3: Secondary | ICD-10-CM | POA: Diagnosis present

## 2023-11-18 DIAGNOSIS — C50212 Malignant neoplasm of upper-inner quadrant of left female breast: Secondary | ICD-10-CM | POA: Diagnosis present

## 2023-11-18 DIAGNOSIS — M25612 Stiffness of left shoulder, not elsewhere classified: Secondary | ICD-10-CM

## 2023-11-18 DIAGNOSIS — Z8249 Family history of ischemic heart disease and other diseases of the circulatory system: Secondary | ICD-10-CM

## 2023-11-18 DIAGNOSIS — R519 Headache, unspecified: Secondary | ICD-10-CM | POA: Diagnosis present

## 2023-11-18 DIAGNOSIS — Z823 Family history of stroke: Secondary | ICD-10-CM

## 2023-11-18 DIAGNOSIS — Z83719 Family history of colon polyps, unspecified: Secondary | ICD-10-CM

## 2023-11-18 DIAGNOSIS — K56609 Unspecified intestinal obstruction, unspecified as to partial versus complete obstruction: Secondary | ICD-10-CM | POA: Diagnosis present

## 2023-11-18 DIAGNOSIS — Z6841 Body Mass Index (BMI) 40.0 and over, adult: Secondary | ICD-10-CM

## 2023-11-18 DIAGNOSIS — I10 Essential (primary) hypertension: Secondary | ICD-10-CM | POA: Diagnosis present

## 2023-11-18 DIAGNOSIS — Z170 Estrogen receptor positive status [ER+]: Secondary | ICD-10-CM

## 2023-11-18 DIAGNOSIS — Z803 Family history of malignant neoplasm of breast: Secondary | ICD-10-CM

## 2023-11-18 DIAGNOSIS — R1032 Left lower quadrant pain: Principal | ICD-10-CM

## 2023-11-18 DIAGNOSIS — R293 Abnormal posture: Secondary | ICD-10-CM

## 2023-11-18 DIAGNOSIS — D72829 Elevated white blood cell count, unspecified: Secondary | ICD-10-CM | POA: Diagnosis present

## 2023-11-18 DIAGNOSIS — F32A Depression, unspecified: Secondary | ICD-10-CM | POA: Diagnosis present

## 2023-11-18 DIAGNOSIS — E782 Mixed hyperlipidemia: Secondary | ICD-10-CM | POA: Diagnosis present

## 2023-11-18 DIAGNOSIS — Z800 Family history of malignant neoplasm of digestive organs: Secondary | ICD-10-CM

## 2023-11-18 DIAGNOSIS — K460 Unspecified abdominal hernia with obstruction, without gangrene: Secondary | ICD-10-CM | POA: Diagnosis present

## 2023-11-18 DIAGNOSIS — Z833 Family history of diabetes mellitus: Secondary | ICD-10-CM

## 2023-11-18 DIAGNOSIS — D3502 Benign neoplasm of left adrenal gland: Secondary | ICD-10-CM | POA: Diagnosis present

## 2023-11-18 DIAGNOSIS — G4733 Obstructive sleep apnea (adult) (pediatric): Secondary | ICD-10-CM | POA: Diagnosis present

## 2023-11-18 DIAGNOSIS — Z818 Family history of other mental and behavioral disorders: Secondary | ICD-10-CM

## 2023-11-18 DIAGNOSIS — Z83438 Family history of other disorder of lipoprotein metabolism and other lipidemia: Secondary | ICD-10-CM

## 2023-11-18 LAB — COMPREHENSIVE METABOLIC PANEL WITH GFR
ALT: 17 U/L (ref 0–44)
AST: 21 U/L (ref 15–41)
Albumin: 3.6 g/dL (ref 3.5–5.0)
Alkaline Phosphatase: 86 U/L (ref 38–126)
Anion gap: 14 (ref 5–15)
BUN: 15 mg/dL (ref 6–20)
CO2: 22 mmol/L (ref 22–32)
Calcium: 9.5 mg/dL (ref 8.9–10.3)
Chloride: 101 mmol/L (ref 98–111)
Creatinine, Ser: 0.74 mg/dL (ref 0.44–1.00)
GFR, Estimated: >60 mL/min (ref >60)
Glucose, Bld: 137 mg/dL — ABNORMAL HIGH (ref 70–99)
Potassium: 3.9 mmol/L (ref 3.5–5.1)
Sodium: 137 mmol/L (ref 135–145)
Total Bilirubin: 0.4 mg/dL (ref 0.0–1.2)
Total Protein: 7.4 g/dL (ref 6.5–8.1)

## 2023-11-18 LAB — CBC
HCT: 43.6 % (ref 36.0–46.0)
Hemoglobin: 14.2 g/dL (ref 12.0–15.0)
MCH: 27.5 pg (ref 26.0–34.0)
MCHC: 32.6 g/dL (ref 30.0–36.0)
MCV: 84.3 fL (ref 80.0–100.0)
Platelets: 455 K/uL — ABNORMAL HIGH (ref 150–400)
RBC: 5.17 MIL/uL — ABNORMAL HIGH (ref 3.87–5.11)
RDW: 13.8 % (ref 11.5–15.5)
WBC: 14.2 K/uL — ABNORMAL HIGH (ref 4.0–10.5)
nRBC: 0 % (ref 0.0–0.2)

## 2023-11-18 LAB — LACTIC ACID, PLASMA: Lactic Acid, Venous: 2 mmol/L (ref 0.5–1.9)

## 2023-11-18 LAB — LIPASE, BLOOD: Lipase: 27 U/L (ref 11–51)

## 2023-11-18 MED ORDER — HYDROMORPHONE HCL 1 MG/ML IJ SOLN
1.0000 mg | Freq: Once | INTRAMUSCULAR | Status: AC
  Administered 2023-11-18: 1 mg via INTRAVENOUS
  Filled 2023-11-18: qty 1

## 2023-11-18 MED ORDER — ONDANSETRON HCL 4 MG/2ML IJ SOLN
4.0000 mg | Freq: Once | INTRAMUSCULAR | Status: AC
  Administered 2023-11-18: 4 mg via INTRAVENOUS
  Filled 2023-11-18: qty 2

## 2023-11-18 MED ORDER — LACTATED RINGERS IV BOLUS
1000.0000 mL | Freq: Once | INTRAVENOUS | Status: AC
  Administered 2023-11-19: 1000 mL via INTRAVENOUS

## 2023-11-18 MED ORDER — LACTATED RINGERS IV BOLUS
500.0000 mL | Freq: Once | INTRAVENOUS | Status: AC
  Administered 2023-11-18: 500 mL via INTRAVENOUS

## 2023-11-18 MED ORDER — IOHEXOL 350 MG/ML SOLN
100.0000 mL | Freq: Once | INTRAVENOUS | Status: AC | PRN
  Administered 2023-11-18: 100 mL via INTRAVENOUS

## 2023-11-18 NOTE — ED Provider Triage Note (Signed)
 Emergency Medicine Provider Triage Evaluation Note  Chelsea Soto , a 58 y.o. female  was evaluated in triage.  Pt complains of left lower quadrant  abdominal pain that has been progressively worse since PT this afternoon, noting that she feels that the pain that she is experiencing in the left lower quadrant feels similar to her incarcerated hernia previously, noting to have felt a pop during physical therapy in her left lower quadrant of her abdomen.  Since then pain has been progressively worse, accompanied with nausea and persistent vomiting.  Dry heaving currently and pain has been uncontrolled.  Denies fever, chest pain, shortness of breath, dysuria, diarrhea  Review of Systems  Positive: N/a Negative: N/a  Physical Exam  BP (!) 118/93 (BP Location: Right Arm)   Pulse 96   Temp 97.8 F (36.6 C)   Resp (!) 40   Ht 5' 3 (1.6 m)   Wt (!) 145.2 kg   SpO2 98%   BMI 56.69 kg/m  Gen:   Awake, no distress   Resp:  Normal effort  MSK:   Moves extremities without difficulty  Other:  Abdomen is tender to left lower quadrant, nontender to all other quadrants.  Obvious herniation noted.   Medical Decision Making  Medically screening exam initiated at 8:08 PM.  Appropriate orders placed.  LASHEBA STEVENS was informed that the remainder of the evaluation will be completed by another provider, this initial triage assessment does not replace that evaluation, and the importance of remaining in the ED until their evaluation is complete.     Beola Terrall RAMAN, [place] 11/18/23 2009

## 2023-11-18 NOTE — Consult Note (Incomplete)
 Reason for Consult/Chief Complaint: SBO 2/2 ventral hernia Consultant: Gennaro, DO  Chelsea Soto is an 58 y.o. female.   HPI: 26F with acute onset abdominal pain after physical therapy session ~1600 this afternoon. Associated nausea and vomiting. Last BM ~1630 today. H/o prior open primary VHR 2/2 incarcerated, colon containing, 7x3cm VH in 2022 by Dr. Teresa. She reports that she was referred to another facility to undergo [facility] with abdominal wall reconstruction, however the surgeon required her to be under a certain weight, a criterion she has not yet met. Of note, she underwent lumpectomy with SLNBx of the left breast for stage 1 breast cancer on 7/31 by Dr. Vanderbilt. She reports a high calcium  score with a plan for further o/p workup by cardiology.    Past Medical History:  Diagnosis Date   Anxiety    Aortic atherosclerosis (HCC)    Back pain    Breast cancer (HCC)    Constipation    Depression    Diverticulosis    Dyspnea    chronic (with exertion)   Edema of both lower extremities    GERD (gastroesophageal reflux disease)    Hiatal hernia    Hip pain    Hypercholesterolemia    Hypertension    Joint pain    Knee pain    Mood swings    Morbid obesity with BMI of 50.0-59.9, adult (HCC)    Post-menopausal bleeding    Prediabetes    Sleep apnea    Severe, on BiPAP   SOB (shortness of breath)    Umbilical hernia    Ventral hernia    Ventral hernia    Vitamin D  deficiency    Wears contact lenses     Past Surgical History:  Procedure Laterality Date   BREAST BIOPSY Left 08/26/2023   US  LT BREAST BX W LOC DEV 1ST LESION IMG BX SPEC US  GUIDE 08/26/2023 [facility]   BREAST BIOPSY Left 10/28/2023   US  LT RADIOACTIVE SEED LOC 10/28/2023 [facility]AMMOGR[facility]   BREAST LUMPECTOMY WITH RADIOACTIVE SEED AND SENTINEL LYMPH NODE BIOPSY Left 10/30/2023   Procedure: BREAST LUMPECTOMY WITH RADIOACTIVE SEED;  Surgeon: Vanderbilt Ned, MD;  Location: [facility] OR;  Service: General;   Laterality: Left;  LEFT BREAST SEED LUMPECTOMY LEFT AXILLARY SENTINEL LYMPH NODE MAPPING   COLONOSCOPY     COLONOSCOPY WITH PROPOFOL  N/A 03/20/2023   Procedure: COLONOSCOPY WITH PROPOFOL ;  Surgeon: Albertus Gordy HERO, MD;  Location: [facility] ENDOSCOPY;  Service: Gastroenterology;  Laterality: N/A;   DILATATION & CURETTAGE/HYSTEROSCOPY WITH MYOSURE N/A 01/21/2019   Procedure: DILATATION & CURETTAGE/HYSTEROSCOPY WITH possible  MYOSURE;  Surgeon: Latisha Medford, MD;  Location: [facility] OR;  Service: Gynecology;  Laterality: N/A;   LAPAROSCOPY N/A 09/14/2020   Procedure: DIAGNOSTIC LAPAROSCOPY;  Surgeon: Teresa Lonni HERO, MD;  Location: [facility] OR;  Service: General;  Laterality: N/A;   LYSIS OF ADHESION N/A 09/14/2020   Procedure: LYSIS OF ADHESION;  Surgeon: Teresa Lonni HERO, MD;  Location: [facility] OR;  Service: General;  Laterality: N/A;   POLYPECTOMY  03/20/2023   Procedure: POLYPECTOMY;  Surgeon: Albertus Gordy HERO, MD;  Location: [facility] ENDOSCOPY;  Service: Gastroenterology;;   SENTINEL NODE BIOPSY Left 10/30/2023   Procedure: BIOPSY, LYMPH NODE, SENTINEL;  Surgeon: Vanderbilt Ned, MD;  Location: [facility] OR;  Service: General;  Laterality: Left;   VENTRAL HERNIA REPAIR N/A 09/14/2020   Procedure: OPEN REPAIR OF INCARCERATED VENTRAL HERNIA  ADULT;  Surgeon: Teresa Lonni HERO, MD;  Location: [facility] OR;  Service: General;  Laterality: N/A;   WISDOM TOOTH EXTRACTION      Family History  Problem Relation Age of Onset   Breast cancer Mother 20   Colon polyps Mother    Diabetes Mother    Hypertension Mother    Heart disease Mother    Depression Mother    Anxiety disorder Mother    Obesity Mother    CAD Father    Hypertension Father    Hyperlipidemia Father    Heart disease Father    Sleep apnea Father    Other Sister        BRIP1+   Stroke Sister    Colon cancer Maternal Grandmother 72   Breast cancer Maternal Grandmother 65   Cancer Maternal Grandfather        unknown type   Colon cancer Maternal Uncle 45    Esophageal cancer Neg Hx    Rectal cancer Neg Hx    Stomach cancer Neg Hx     Social History:  reports that she has never smoked. She has never used smokeless tobacco. She reports current alcohol use. She reports that she does not use drugs.  Allergies: No Known Allergies  Medications: I have reviewed the patient's current medications.  Results for orders placed or performed during the hospital encounter of 11/18/23 (from the past 48 hours)  Lipase, blood     Status: None   Collection Time: 11/18/23  8:02 PM  Result Value Ref Range   Lipase 27 11 - 51 U/L    Comment: Performed at [facility], [address] [place], [place] [address]  Comprehensive metabolic panel     Status: Abnormal   Collection Time: 11/18/23  8:02 PM  Result Value Ref Range   Sodium 137 135 - 145 mmol/L   Potassium 3.9 3.5 - 5.1 mmol/L   Chloride 101 98 - 111 mmol/L   CO2 22 22 - 32 mmol/L   Glucose, Bld 137 (H) 70 - 99 mg/dL    Comment: Glucose reference range applies only to samples taken after fasting for at least 8 hours.   BUN 15 6 - 20 mg/dL   Creatinine, Ser 9.25 0.44 - 1.00 mg/dL   Calcium  9.5 8.9 - 10.3 mg/dL   Total Protein 7.4 6.5 - 8.1 g/dL   Albumin  3.6 3.5 - 5.0 g/dL   AST 21 15 - 41 U/L   ALT 17 0 - 44 U/L   Alkaline Phosphatase 86 38 - 126 U/L   Total Bilirubin 0.4 0.0 - 1.2 mg/dL   GFR, Estimated >39 >39 mL/min    Comment: (NOTE) Calculated using the CKD-EPI Creatinine Equation (2021)    Anion gap 14 5 - 15    Comment: Performed at [facility], [address] [place], [place] [address]  CBC     Status: Abnormal   Collection Time: 11/18/23  8:02 PM  Result Value Ref Range   WBC 14.2 (H) 4.0 - 10.5 K/uL   RBC 5.17 (H) 3.87 - 5.11 MIL/uL   Hemoglobin 14.2 12.0 - 15.0 g/dL   HCT 56.3 63.9 - 53.9 %   MCV 84.3 80.0 - 100.0 fL   MCH 27.5 26.0 - 34.0 pg   MCHC 32.6 30.0 - 36.0 g/dL   RDW 86.1 88.4 - 84.4 %   Platelets 455 (H) 150 - 400 K/uL   nRBC 0.0 0.0 - 0.2 %     Comment: Performed at [facility], [address] [place],  [place] [address]  Lactic acid, plasma     Status: Abnormal   Collection Time: 11/18/23  9:27 PM  Result Value Ref Range   Lactic Acid, Venous 2.0 (HH) 0.5 - 1.9 mmol/L    Comment: CRITICAL RESULT CALLED TO, READ BACK BY AND VERIFIED WITH JESUS DASEN. RN @2226  11/18/23 SATRAINR Performed at [facility], [address], [place], [place] [address]     CT ABDOMEN PELVIS W CONTRAST Result Date: 11/18/2023 CLINICAL DATA:  Bowel obstruction suspected LLQ abdominal pain suspected incarcerated hernia EXAM: CT ABDOMEN AND PELVIS WITH CONTRAST TECHNIQUE: Multidetector CT imaging of the abdomen and pelvis was performed using the standard protocol following bolus administration of intravenous contrast. RADIATION DOSE REDUCTION: This exam was performed according to the departmental dose-optimization program which includes automated exposure control, adjustment of the mA and/or kV according to patient size and/or use of iterative reconstruction technique. CONTRAST:  OMNIPAQUE  IOHEXOL  350 MG/ML SOLN COMPARISON:  CT virtual colonoscopy 09/06/2021 trauma CT abdomen pelvis 06/14/2020. FINDINGS: Lower chest: No acute abnormality. Hepatobiliary: No focal liver abnormality. No gallstones, gallbladder wall thickening, or pericholecystic fluid. No biliary dilatation. Pancreas: No focal lesion. Normal pancreatic contour. No surrounding inflammatory changes. No main pancreatic ductal dilatation. Spleen: Normal in size without focal abnormality. Adrenals/Urinary Tract: Interval increase in size of a 2.2 cm (from 1.8 cm) left adrenal gland nodule with a density of 57 Hounsfield units. Bilateral kidneys enhance symmetrically. No hydronephrosis. No hydroureter. The urinary bladder is unremarkable. Stomach/Bowel: Stomach is within normal limits. No evidence of large bowel wall thickening or dilatation. Dilatation of a short segment of small bowel within the  left portion of the abdominal hernia (2.3 cm in caliber) with fecalized material within the lumen. No pneumatosis. Colonic diverticulosis. Vague fat stranding along a mid sigmoid diverticula (7:82). Appendix appears normal. Vascular/Lymphatic: No abdominal aorta or iliac aneurysm. Mild to moderate atherosclerotic plaque of the aorta and its branches. No abdominal, pelvic, or inguinal lymphadenopathy. Reproductive: Uterus and bilateral adnexa are unremarkable. Other: Trace free fluid distal to the obstructed loop of small bowel. No intraperitoneal free gas. No organized fluid collection. Musculoskeletal: Large lobulated infraumbilical ventral hernia containing the transverse colon, ascending colon, appendix, and medullary of the small bowel. No suspicious lytic or blastic osseous lesions. No acute displaced fracture. Multilevel degenerative changes of the spine. IMPRESSION: 1. Short segment small bowel obstruction in the setting of a large lobulated infraumbilical ventral hernia. Dilatation of a short segment of small bowel within the left portion of the abdominal hernia (2.3 cm in caliber) with fecalized material within the lumen. Associated trace free fluid within the hernia along the obstructed small bowel. 2. Colonic diverticulosis with vague fat stranding along a focal mid sigmoid diverticula. No associated bowel wall thickening. Findings could represent developing diverticulitis. 3. Interval increase in size of a 2.2 cm (from 1.8 cm) left adrenal gland nodule. Probable adenoma. Recommend adrenal washout CT or chemical shift MRI. JACR 2017 Aug; 14(8):1038-44, JCAT 2016 Mar-Apr; 40(2):194-200, Urol J 2006 Spring; 3(2):71-4. 4.  Aortic Atherosclerosis (ICD10-I70.0). Electronically Signed   By: Morgane  Naveau M.D.   On: 11/18/2023 23:10    ROS 10 point review of systems is negative except as listed above in HPI.   Physical Exam Blood pressure (!) 152/72, pulse 75, temperature 98.7 F (37.1 C), temperature  source Oral, resp. rate 15, height 5' 3 (1.6 m), weight (!) 145.2 kg, SpO2 98%. Constitutional: well-developed, well-nourished HEENT: pupils equal, round, reactive to light, 2mm b/l, moist  conjunctiva, external inspection of ears and nose normal, hearing intact Oropharynx: normal oropharyngeal mucosa, poor dentition Neck: no thyromegaly, trachea midline Chest: breath sounds equal bilaterally, normal respiratory effort, no midline or lateral chest wall tenderness to palpation/deformity, healing incisions of L breast and L axilla without evidence of infection Abdomen: soft, NT, L-sided focal abdominal tenderness and firmness, no peritoneal signs, no bruising, no hepatosplenomegaly Extremities: 2+ radial and pedal pulses bilaterally, intact motor and sensation bilateral UE and LE, no peripheral edema MSK: unable to assess gait/station, no clubbing/cyanosis of fingers/toes, normal ROM of all four extremities Skin: warm, dry, no rashes Psych: normal memory, normal mood/affect     Assessment/Plan: Incarcerated ventral hernia - patient has a wide mouthed-VH, but appears to have a short segment of bowel within the hernia sac with an abnormal appearance - thickened wall, fluid around it, and intraluminal fecalization. These radiographic findings coupled with focal tenderness on exam, leukocytosis, and LA of 2 raise my concern for an adhesive band causing a short segment obstruction +/- strangulation, but an emergent indication for surgery is not entirely clear. I will plan to allow the patient to complete the liter of fluids already infusing and repeat her lactate. If LA continues to rise, will plan to proceed with urgent surgical exploration. Proceed with NGT placement to LIWS as ordered by EDP. Due to the complexity of decision-making, I have discussed this case with my senior partner.  Post-op lumpectomy and SLNBx - surgical sites healing well  FEN - NPO except sips/chips DVT - SCDs Dispo - per primary     Dreama GEANNIE Hanger, MD General and Trauma Surgery [facility]

## 2023-11-18 NOTE — ED Triage Notes (Signed)
 Pt coming in today after physical therapy with extreme pain. Pt reports that she has a hernia and that she thinks she poped something at physical therapy.

## 2023-11-18 NOTE — ED Notes (Signed)
 Physician bedside.

## 2023-11-18 NOTE — ED Notes (Signed)
 Patient transported to CT

## 2023-11-18 NOTE — ED Notes (Signed)
 Positional IV placed by IV ultrasound was blown per CT. IV team consulted to place another one.

## 2023-11-18 NOTE — ED Provider Notes (Signed)
 [place] [facility] Provider Note   CSN: [id number] Arrival date & time: 11/18/23  8044     Patient presents with: Abdominal Pain and Hernia   Chelsea Soto is a 58 y.o. female.  Abdominal Pain Chelsea Soto , a 58 y.o. female.  Pt complains of left lower quadrant  abdominal pain that has been progressively worse since PT this afternoon, noting that she feels that the pain that she is experiencing in the left lower quadrant feels similar to her incarcerated hernia previously, noting to have felt a pop during physical therapy in her left lower quadrant of her abdomen.  Since then pain has been progressively worse, accompanied with nausea and persistent vomiting.  Dry heaving currently and pain has been uncontrolled.  Previous medical history of GERD, diverticulosis, ventral hernia, incarcerated hernia, constipation, anxiety, breast cancer status postlumpectomy, not having undergone radiation therapy yet.   Denies fever, cough, congestion, hemoptysis, hematemesis, chest pain, shortness of breath, dysuria, diarrhea, hematochezia, melena, vaginal discharge, vaginal bleeding, lower leg swelling.     Prior to Admission medications   Medication Sig Start Date End Date Taking? Authorizing Provider  Cholecalciferol (VITAMIN D -3) 125 MCG (5000 UT) TABS Take 5,000 Units by mouth daily. Patient not taking: Reported on 11/06/2023    [provider]  dicyclomine  (BENTYL ) 10 MG capsule Take 1 capsule (10 mg total) by mouth 4 (four) times daily as needed. Patient not taking: Reported on 11/06/2023 08/28/22     ibuprofen  (ADVIL ) 800 MG tablet Take 1 tablet (800 mg total) by mouth every 8 (eight) hours as needed. Patient not taking: Reported on 11/06/2023 10/30/23   Vanderbilt Ned, MD  lisinopril -hydrochlorothiazide  (ZESTORETIC ) 20-25 MG tablet Take one tablet by mouth daily. 10/06/23     lisinopril -hydrochlorothiazide  (ZESTORETIC ) 20-25 MG tablet Take 1  tablet by mouth daily. Patient not taking: Reported on 11/06/2023 10/28/23     Multiple Vitamin (MULTIVITAMIN WITH MINERALS) TABS tablet Take 1 tablet by mouth daily. Patient not taking: Reported on 11/06/2023    [provider]  oxyCODONE  (OXY IR/ROXICODONE ) 5 MG immediate release tablet Take 1 tablet (5 mg total) by mouth every 6 (six) hours as needed for severe pain (pain score 7-10). Patient not taking: Reported on 11/06/2023 10/30/23   Vanderbilt Ned, MD  rosuvastatin  (CRESTOR ) 10 MG tablet Take 1 tablet (10 mg total) by mouth daily. 11/10/23       Allergies: Patient has no known allergies.    Review of Systems  Gastrointestinal:  Positive for abdominal pain.  All other systems reviewed and are negative.   Updated Vital Signs BP (!) 118/93 (BP Location: Right Arm)   Pulse 96   Temp 97.8 F (36.6 C)   Resp (!) 40   Ht 5' 3 (1.6 m)   Wt (!) 145.2 kg   SpO2 98%   BMI 56.69 kg/m   Physical Exam Vitals and nursing note reviewed.  Constitutional:      General: She is in acute distress.     Appearance: Normal appearance. She is obese. She is ill-appearing.  HENT:     Head: Normocephalic and atraumatic.  Eyes:     General: No scleral icterus.       Right eye: No discharge.        Left eye: No discharge.     Extraocular Movements: Extraocular movements intact.     Conjunctiva/sclera: Conjunctivae normal.  Cardiovascular:     Rate and Rhythm: Normal rate and regular  rhythm.     Pulses: Normal pulses.     Heart sounds: Normal heart sounds. No murmur heard.    No friction rub. No gallop.  Pulmonary:     Effort: Pulmonary effort is normal. No respiratory distress.     Breath sounds: No stridor. No wheezing, rhonchi or rales.  Chest:     Chest wall: No tenderness.  Abdominal:     General: Abdomen is flat and protuberant. There is distension.     Palpations: Abdomen is soft. There is no pulsatile mass.     Tenderness: There is abdominal tenderness in the left lower  quadrant. There is guarding. There is no right CVA tenderness or left CVA tenderness.     Hernia: A hernia is present. Hernia is present in the ventral area.  Musculoskeletal:        General: No swelling, deformity or signs of injury.     Cervical back: Normal range of motion. No rigidity.     Right lower leg: No edema.     Left lower leg: No edema.  Skin:    General: Skin is warm and dry.     Findings: No bruising, erythema or lesion.  Neurological:     General: No focal deficit present.     Mental Status: She is alert and oriented to person, place, and time. Mental status is at baseline.     Sensory: No sensory deficit.     Motor: No weakness.  Psychiatric:        Mood and Affect: Mood normal.     (all labs ordered are listed, but only abnormal results are displayed) Labs Reviewed  COMPREHENSIVE METABOLIC PANEL WITH GFR - Abnormal; Notable for the following components:      Result Value   Glucose, Bld 137 (*)    All other components within normal limits  CBC - Abnormal; Notable for the following components:   WBC 14.2 (*)    RBC 5.17 (*)    Platelets 455 (*)    All other components within normal limits  LIPASE, BLOOD  URINALYSIS, ROUTINE W REFLEX MICROSCOPIC  LACTIC ACID, PLASMA  LACTIC ACID, PLASMA    EKG: None  Radiology: No results found.   Procedures   Medications Ordered in the ED  lactated ringers  bolus 500 mL (has no administration in time range)  HYDROmorphone  (DILAUDID ) injection 1 mg (1 mg Intravenous Given 11/18/23 2104)  ondansetron  (ZOFRAN ) injection 4 mg (4 mg Intravenous Given 11/18/23 2104)                                  Medical Decision Making Amount and/or Complexity of Data Reviewed Labs: ordered. Radiology: ordered.  Risk Prescription drug management.   This patient is a 58 year old female who presents to the ED for concern of left lower quadrant abdominal pain, similar to her previous incarcerated hernia in the past.  Noted that  this pain began progressively after going to PT and feeling a pop in her left lower quadrant.  Pain then progressively became worse, noting that she had nausea and vomiting repeatedly since then. Her last bowel movement was 3 hours before her arrival to the emergency department and was normal at that time.  Vomit has been nonbloody.  Patient is in acute distress and pain.  On physical exam, patient is afebrile, alert and orient x 4, speaking in full sentences, nontachycardic.  Acute distress secondary to pain,  tachypneic secondary to pain.  Abdomen is exquisitely tender to left lower quadrant, with no tenderness noted to all other quadrants.  No CVA tenderness.  No lower leg edema.  Notably able to feel hernia to left lower quadrant.  However is difficult to assess size due to habitus.  Unremarkable exam otherwise.  Immediately provided pain medications and ordered labs.  After Dilaudid , on reevaluation, patient notes that her pain is controlled at this time.  Zofran  controlling nausea.  Attempted to reduce hernia, with patient laid in the Trendelenburg position.  Patient requested fluids due to her feeling dehydrated, will provide 500 mL of LR.  No longer in acute distress.  CBC does not leukocytosis with CMP being unremarkable.  Lipase unremarkable.  Pending labs at this time.  Pending imaging.  Patient care transferred over to Dr. Gennaro pending labs and CT scans.  With patient's pain controlled, nausea controlled.  Differential diagnoses prior to evaluation: The emergent differential diagnosis includes, but is not limited to, incarcerated hernia, diverticulitis, ovarian torsion, ovarian cyst, gastroenteritis, Mesenteric ischemia, diverticulitis, nephrolithiasis, constipation, bowel obstruction, IBD, PID this is not an exhaustive differential.   Past Medical History / Co-morbidities / Social History: GERD, diverticulosis, ventral hernia, incarcerated hernia, constipation, anxiety, breast  cancer.  Additional history: Chart reviewed. Pertinent results include:   Last seen by cardiology on 11/11/2023, patient noted to have exertional dyspnea at that time, which suspected to be may be anginal equivalent.  Echocardiogram ordered.  Last seen for [place] surgery 11/06/2023, noted to have underwent a left breast lumpectomy and left axillary sentinel node lymph node mapping on 10/30/2023.  Scheduled for radiation but has not undergone.  Started on doxycycline at that time.  Lab Tests/Imaging studies: I personally interpreted labs/imaging and the pertinent results include:    CBC notes a leukocytosis of 14.2 and elevated RBC of 5.17 and elevated platelets of 455. CMP unremarkable  Lipase unremarkable Lactic acid pending UA pending CT abdomen and pelvis with contrast pending  Cardiac monitoring: EKG obtained and interpreted by myself and attending physician which shows: Sinus rhythm with borderline T wave abnormalities   Medications: I ordered medication including Dilaudid , Zofran , LR.  I have reviewed the patients home medicines and have made adjustments as needed.  Critical Interventions: None  Social Determinants of Health: Son is at bedside, assisting her.   Disposition: 9:48 PM Care of Chelsea Soto transferred to Dr. Gennaro at the end of my shift as the patient will require reassessment once labs/imaging have resulted. Patient presentation, ED course, and plan of care discussed with review of all pertinent labs and imaging. Please see his/her note for further details regarding further ED course and disposition. Plan at time of handoff is await CT, likely admit secondary to either diverticulitis versus incarcerated hernia. This may be altered or completely changed at the discretion of the oncoming team pending results of further workup.   Final diagnoses:  LLQ abdominal pain    ED Discharge Orders     None          Chelsea Soto 11/18/23 2153     Gennaro Bouchard L, DO 11/20/23 [phone]

## 2023-11-18 NOTE — Therapy (Addendum)
 OUTPATIENT PHYSICAL THERAPY BREAST CANCER POST OP FOLLOW UP   Patient Name: Chelsea Soto MRN: [id number] DOB:1965-11-28, 58 y.o., female Today's Date: 11/18/2023  END OF SESSION:  PT End of Session - 11/18/23 1504     Visit Number 3    Number of Visits 10    Date for PT Re-Evaluation 12/04/23    PT Start Time 1503    PT Stop Time 1536    PT Time Calculation (min) 33 min    Activity Tolerance Patient tolerated treatment well    Behavior During Therapy WFL for tasks assessed/performed          Past Medical History:  Diagnosis Date   Anxiety    Aortic atherosclerosis (HCC)    Back pain    Breast cancer (HCC)    Constipation    Depression    Diverticulosis    Dyspnea    chronic (with exertion)   Edema of both lower extremities    GERD (gastroesophageal reflux disease)    Hiatal hernia    Hip pain    Hypercholesterolemia    Hypertension    Joint pain    Knee pain    Mood swings    Morbid obesity with BMI of 50.0-59.9, adult (HCC)    Post-menopausal bleeding    Prediabetes    Sleep apnea    Severe, on BiPAP   SOB (shortness of breath)    Umbilical hernia    Ventral hernia    Ventral hernia    Vitamin D  deficiency    Wears contact lenses    Past Surgical History:  Procedure Laterality Date   BREAST BIOPSY Left 08/26/2023   US  LT BREAST BX W LOC DEV 1ST LESION IMG BX SPEC US  GUIDE 08/26/2023 [facility]   BREAST BIOPSY Left 10/28/2023   US  LT RADIOACTIVE SEED LOC 10/28/2023 [facility]   BREAST LUMPECTOMY WITH RADIOACTIVE SEED AND SENTINEL LYMPH NODE BIOPSY Left 10/30/2023   Procedure: BREAST LUMPECTOMY WITH RADIOACTIVE SEED;  Surgeon: Vanderbilt Ned, MD;  Location: [facility] OR;  Service: General;  Laterality: Left;  LEFT BREAST SEED LUMPECTOMY LEFT AXILLARY SENTINEL LYMPH NODE MAPPING   COLONOSCOPY     COLONOSCOPY WITH PROPOFOL  N/A 03/20/2023   Procedure: COLONOSCOPY WITH PROPOFOL ;  Surgeon: Albertus Gordy HERO, MD;  Location: [facility] ENDOSCOPY;  Service:  Gastroenterology;  Laterality: N/A;   DILATATION & CURETTAGE/HYSTEROSCOPY WITH MYOSURE N/A 01/21/2019   Procedure: DILATATION & CURETTAGE/HYSTEROSCOPY WITH possible  MYOSURE;  Surgeon: Latisha Medford, MD;  Location: [facility] OR;  Service: Gynecology;  Laterality: N/A;   LAPAROSCOPY N/A 09/14/2020   Procedure: DIAGNOSTIC LAPAROSCOPY;  Surgeon: Teresa Lonni HERO, MD;  Location: [facility] OR;  Service: General;  Laterality: N/A;   LYSIS OF ADHESION N/A 09/14/2020   Procedure: LYSIS OF ADHESION;  Surgeon: Teresa Lonni HERO, MD;  Location: [facility] OR;  Service: General;  Laterality: N/A;   POLYPECTOMY  03/20/2023   Procedure: POLYPECTOMY;  Surgeon: Albertus Gordy HERO, MD;  Location: [facility] ENDOSCOPY;  Service: Gastroenterology;;   SENTINEL NODE BIOPSY Left 10/30/2023   Procedure: BIOPSY, LYMPH NODE, SENTINEL;  Surgeon: Vanderbilt Ned, MD;  Location: [facility] OR;  Service: General;  Laterality: Left;   VENTRAL HERNIA REPAIR N/A 09/14/2020   Procedure: OPEN REPAIR OF INCARCERATED VENTRAL HERNIA  ADULT;  Surgeon: Teresa Lonni HERO, MD;  Location: [facility] OR;  Service: General;  Laterality: N/A;   WISDOM TOOTH EXTRACTION     Patient Active Problem List   Diagnosis Date Noted   BRIP1 gene mutation  positive 09/22/2023   Genetic testing 09/15/2023   Malignant neoplasm of upper-inner quadrant of left breast in female, estrogen receptor positive (HCC) 09/01/2023   OSA on CPAP 05/01/2023   Positive colorectal cancer screening using Cologuard test 03/20/2023   Benign neoplasm of transverse colon 03/20/2023   Benign neoplasm of descending colon 03/20/2023   Benign neoplasm of sigmoid colon 03/20/2023   Benign neoplasm of rectum 03/20/2023   Constipation 11/12/2022   Coronary artery calcification seen on CAT scan 08/06/2022   Mixed hyperlipidemia 08/06/2022   Primary hypertension 08/06/2022   Class 3 severe obesity with serious comorbidity and body mass index (BMI) of 50.0 to 59.9 in adult 08/06/2022   Prediabetes 08/06/2022    Other fatigue 08/06/2022   SOB (shortness of breath) on exertion 08/06/2022   Incarcerated hernia 09/14/2020    PCP: Lamarr Rotunda MD  REFERRING PROVIDER: Dr. Debby Shipper   REFERRING DIAG: Left breast cancer  THERAPY DIAG:  Stiffness of left shoulder, not elsewhere classified  Abnormal posture  Malignant neoplasm of upper-inner quadrant of left breast in female, estrogen receptor positive (HCC)  Rationale for Evaluation and Treatment: Rehabilitation  ONSET DATE: 07/08/23  SUBJECTIVE:                                                                                                                                                                                           SUBJECTIVE STATEMENT: I feel like my ROM is doing better.   PERTINENT HISTORY:  Patient was diagnosed on 07/08/2023 with left grade 2 invasive ductal carcinoma breast cancer. It measures 8 mm and is located in the upper inner quadrant. It is ER/PR positive and HER2 negative with a Ki67 of 2%. 10/30/23- L breast lumpectomy and SLNB 0/2  PATIENT GOALS:  Reassess how my recovery is going related to arm function, pain, and swelling.  PAIN:  Are you having pain? No  PRECAUTIONS: Recent Surgery, left UE Lymphedema risk,   RED FLAGS: None   ACTIVITY LEVEL / LEISURE: walking on treadmill, 3x/wk for 30-35 min   OBJECTIVE:   PATIENT SURVEYS:  QUICK DASH:     OBSERVATIONS: Healing lumpectomy scar with glue intact, healing SLNB scar with redness surrounding it due to infection which pt was just prescribed antibiotics  POSTURE:  Forward head and rounded shoulders posture   UPPER EXTREMITY AROM/PROM:   A/PROM RIGHT   eval    Shoulder extension 55  Shoulder flexion 138  Shoulder abduction 137  Shoulder internal rotation 77  Shoulder external rotation 78                          (  Blank rows = not tested)   A/PROM LEFT   Eval 09/03/23 LEFT 11/06/23 LEFT  11/18/23  Shoulder extension 63 58   Shoulder  flexion 123 130 160  Shoulder abduction 145 110 175  Shoulder internal rotation 87 75 90  Shoulder external rotation 78 85                           (Blank rows = not tested)   CERVICAL AROM: All within normal limits   UPPER EXTREMITY STRENGTH: WNL   LYMPHEDEMA ASSESSMENTS (in cm):    LANDMARK RIGHT   eval  10 cm proximal to olecranon process 38.3  Olecranon process 28.7  10 cm proximal to ulnar styloid process 26.3  Just proximal to ulnar styloid process 16.6  Across hand at thumb web space 19.6  At base of 2nd digit 6.5  (Blank rows = not tested)   LANDMARK LEFT   Eval 09/03/23 LEFT 11/06/23  10 cm proximal to olecranon process 40.1 39.6  Olecranon process 28.9 28.5  10 cm proximal to ulnar styloid process 25.6 24.6  Just proximal to ulnar styloid process 17 17.6  Across hand at thumb web space 19.5 19.8  At base of 2nd digit 6.6 6.1  (Blank rows = not tested)  Surgery type/Date: 10/30/23- L breast lumpectomy and SLNB Number of lymph nodes removed: 0/2 Current/past treatment (chemo, radiation, hormone therapy): will require radiation and hormone therapy  Other symptoms:  Heaviness/tightness No Pain Yes Pitting edema No Infections Yes currently infection in L axilla Decreased scar mobility Yes healing scars Stemmer sign No  TREATMENT PERFORMED: 11/18/23:  Remeasured shoulder ROM - better than baseline Instructed pt in entire Strength ABC program and substituted pelvic tilts for abdominal crunches and superwoman exercise, did not do step ups, pt unable to do butterfly stretch but can do it in the pool. Pt did 10 reps of all exercises and held all stretches for 30 sec. She used 1 lb for resistance with strengthening. Educated pt on proper way to progress exercises without increasing risk of lymphedema by starting with 1 lb and 1 set of 10 working up to 3 sets of 10 before increasing resistance.   PATIENT EDUCATION:  Education details: ABC class, lymphedema risk reduction,  post op exercises, scar mobilization Person educated: Patient Education method: Explanation and Handouts Education comprehension: verbalized understanding  HOME EXERCISE PROGRAM: Reviewed previously given post op HEP.   ASSESSMENT:  CLINICAL IMPRESSION: Pt took antibiotics for treatment of cellulitis in her axilla. Her ROM was better than baseline today. Instructed pt in entire Strength ABC program and issued handout. Educated pt in proper way to progress strengthening exercises without increasing risk of lymphedema.   Pt will benefit from skilled therapeutic intervention to improve on the following deficits: Decreased knowledge of precautions, impaired UE functional use, pain, decreased ROM, postural dysfunction.   PT treatment/interventions: ADL/Self care home management, [phone]- PT Re-evaluation, 97110-Therapeutic exercises, 97530- Therapeutic activity, [id number]- Neuromuscular re-education, 97535- Self Care, [id number]- Manual therapy, [id number]- Orthotic Initial, and [id number]- Orthotic/Prosthetic subsequent   GOALS: Goals reviewed with patient? Yes  LONG TERM GOALS:  (STG=LTG)  GOALS Name Target Date  Goal status  1 Pt will demonstrate she has regained full shoulder ROM and function post operatively compared to baselines.  Baseline: 12/04/23 MET  2 Pt will be independent in Strength ABC program for continued stretching and strengthening.  12/04/23 MET     PLAN:  PT FREQUENCY/DURATION: 2x/wk for  4 weeks  PLAN FOR NEXT SESSION: rassess indep with Strength ABC program, re print post op handout and give to pt   [facility]  [address], [address]  [place] [address]  [phone]  After Breast Cancer Class Video It is recommended you view the ABC class video to be educated on lymphedema risk reduction. This video lasts for about 30 minutes. It can be viewed on our website here:  [email or web address]  Scar massage You can begin gentle scar massage to you incision sites. Gently place one hand on the incision and move the skin (without sliding on the skin) in various directions. Do this for a few minutes and then you can gently massage either coconut oil or vitamin E cream into the scars.  Compression garment You should continue wearing your compression bra until you feel like you no longer have swelling.  Home exercise Program Continue doing the exercises you were given until you feel like you can do them without feeling any tightness at the end.   Walking Program Studies show that 30 minutes of walking per day (fast enough to elevate your heart rate) can significantly reduce the risk of a cancer recurrence. If you can't walk due to other medical reasons, we encourage you to find another activity you could do (like a stationary bike or water exercise).  Posture After breast cancer surgery, people frequently sit with rounded shoulders posture because it puts their incisions on slack and feels better. If you sit like this and scar tissue forms in that position, you can become very tight and have pain sitting or standing with good posture. Try to be aware of your posture and sit and stand up tall to heal properly.  Follow up PT: It is recommended you return every 3 months for the first 3 years following surgery to be assessed on the SOZO machine for an L-Dex score. This helps prevent clinically significant lymphedema in 95% of patients. These follow up screens are 10 minute appointments that you are not billed for.  Florina Sever [place], PT 11/18/2023, 3:50 PM  PHYSICAL THERAPY DISCHARGE SUMMARY  Visits from Start of Care: 3  Current functional level related to goals / functional outcomes: See above   Remaining deficits: See above   Education / Equipment: Lymphedema education, Strength ABC  program   Patient agrees to discharge. Patient goals were met. Patient is being discharged due to meeting the stated rehab goals.  Florina Sever [place], [place] 11/27/23 4:45 PM

## 2023-11-19 ENCOUNTER — Encounter (HOSPITAL_COMMUNITY): Payer: Self-pay | Admitting: Internal Medicine

## 2023-11-19 ENCOUNTER — Emergency Department (HOSPITAL_COMMUNITY)

## 2023-11-19 ENCOUNTER — Inpatient Hospital Stay (HOSPITAL_COMMUNITY)

## 2023-11-19 ENCOUNTER — Encounter (HOSPITAL_COMMUNITY)
Admission: EM | Disposition: A | Discharge status: Home / Self Care | Payer: Self-pay | Source: Home / Self Care | Attending: Internal Medicine

## 2023-11-19 DIAGNOSIS — Z853 Personal history of malignant neoplasm of breast: Secondary | ICD-10-CM | POA: Diagnosis not present

## 2023-11-19 DIAGNOSIS — Z803 Family history of malignant neoplasm of breast: Secondary | ICD-10-CM | POA: Diagnosis not present

## 2023-11-19 DIAGNOSIS — D72829 Elevated white blood cell count, unspecified: Secondary | ICD-10-CM | POA: Diagnosis present

## 2023-11-19 DIAGNOSIS — K460 Unspecified abdominal hernia with obstruction, without gangrene: Secondary | ICD-10-CM

## 2023-11-19 DIAGNOSIS — E782 Mixed hyperlipidemia: Secondary | ICD-10-CM | POA: Diagnosis present

## 2023-11-19 DIAGNOSIS — Z83719 Family history of colon polyps, unspecified: Secondary | ICD-10-CM | POA: Diagnosis not present

## 2023-11-19 DIAGNOSIS — K56609 Unspecified intestinal obstruction, unspecified as to partial versus complete obstruction: Secondary | ICD-10-CM | POA: Diagnosis present

## 2023-11-19 DIAGNOSIS — Z818 Family history of other mental and behavioral disorders: Secondary | ICD-10-CM | POA: Diagnosis not present

## 2023-11-19 DIAGNOSIS — E872 Acidosis, unspecified: Secondary | ICD-10-CM | POA: Diagnosis present

## 2023-11-19 DIAGNOSIS — Z800 Family history of malignant neoplasm of digestive organs: Secondary | ICD-10-CM | POA: Diagnosis not present

## 2023-11-19 DIAGNOSIS — Z8249 Family history of ischemic heart disease and other diseases of the circulatory system: Secondary | ICD-10-CM | POA: Diagnosis not present

## 2023-11-19 DIAGNOSIS — F32A Depression, unspecified: Secondary | ICD-10-CM | POA: Diagnosis present

## 2023-11-19 DIAGNOSIS — F419 Anxiety disorder, unspecified: Secondary | ICD-10-CM | POA: Diagnosis present

## 2023-11-19 DIAGNOSIS — K219 Gastro-esophageal reflux disease without esophagitis: Secondary | ICD-10-CM | POA: Diagnosis present

## 2023-11-19 DIAGNOSIS — K436 Other and unspecified ventral hernia with obstruction, without gangrene: Secondary | ICD-10-CM | POA: Diagnosis present

## 2023-11-19 DIAGNOSIS — I10 Essential (primary) hypertension: Secondary | ICD-10-CM

## 2023-11-19 DIAGNOSIS — Z170 Estrogen receptor positive status [ER+]: Secondary | ICD-10-CM | POA: Diagnosis not present

## 2023-11-19 DIAGNOSIS — K5730 Diverticulosis of large intestine without perforation or abscess without bleeding: Secondary | ICD-10-CM | POA: Diagnosis present

## 2023-11-19 DIAGNOSIS — Z83438 Family history of other disorder of lipoprotein metabolism and other lipidemia: Secondary | ICD-10-CM | POA: Diagnosis not present

## 2023-11-19 DIAGNOSIS — Z823 Family history of stroke: Secondary | ICD-10-CM | POA: Diagnosis not present

## 2023-11-19 DIAGNOSIS — Z1152 Encounter for screening for COVID-19: Secondary | ICD-10-CM | POA: Diagnosis not present

## 2023-11-19 DIAGNOSIS — D3502 Benign neoplasm of left adrenal gland: Secondary | ICD-10-CM | POA: Diagnosis present

## 2023-11-19 DIAGNOSIS — Z6841 Body Mass Index (BMI) 40.0 and over, adult: Secondary | ICD-10-CM | POA: Diagnosis not present

## 2023-11-19 DIAGNOSIS — G4733 Obstructive sleep apnea (adult) (pediatric): Secondary | ICD-10-CM

## 2023-11-19 DIAGNOSIS — Z833 Family history of diabetes mellitus: Secondary | ICD-10-CM | POA: Diagnosis not present

## 2023-11-19 DIAGNOSIS — E66813 Obesity, class 3: Secondary | ICD-10-CM | POA: Diagnosis present

## 2023-11-19 LAB — BASIC METABOLIC PANEL WITH GFR
Anion gap: 15 (ref 5–15)
BUN: 14 mg/dL (ref 6–20)
CO2: 22 mmol/L (ref 22–32)
Calcium: 9.2 mg/dL (ref 8.9–10.3)
Chloride: 100 mmol/L (ref 98–111)
Creatinine, Ser: 0.74 mg/dL (ref 0.44–1.00)
GFR, Estimated: >60 mL/min (ref >60)
Glucose, Bld: 151 mg/dL — ABNORMAL HIGH (ref 70–99)
Potassium: 4.5 mmol/L (ref 3.5–5.1)
Sodium: 137 mmol/L (ref 135–145)

## 2023-11-19 LAB — HEPATIC FUNCTION PANEL
ALT: 17 U/L (ref 0–44)
AST: 20 U/L (ref 15–41)
Albumin: 3.3 g/dL — ABNORMAL LOW (ref 3.5–5.0)
Alkaline Phosphatase: 72 U/L (ref 38–126)
Bilirubin, Direct: 0.1 mg/dL (ref 0.0–0.2)
Indirect Bilirubin: 0.1 mg/dL — ABNORMAL LOW (ref 0.3–0.9)
Total Bilirubin: 0.2 mg/dL (ref 0.0–1.2)
Total Protein: 6.7 g/dL (ref 6.5–8.1)

## 2023-11-19 LAB — CBC WITH DIFFERENTIAL/PLATELET
Abs Immature Granulocytes: 0.06 K/uL (ref 0.00–0.07)
Basophils Absolute: 0 K/uL (ref 0.0–0.1)
Basophils Relative: 0 %
Eosinophils Absolute: 0 K/uL (ref 0.0–0.5)
Eosinophils Relative: 0 %
HCT: 41.2 % (ref 36.0–46.0)
Hemoglobin: 13.3 g/dL (ref 12.0–15.0)
Immature Granulocytes: 1 %
Lymphocytes Relative: 13 %
Lymphs Abs: 1.4 K/uL (ref 0.7–4.0)
MCH: 28.1 pg (ref 26.0–34.0)
MCHC: 32.3 g/dL (ref 30.0–36.0)
MCV: 86.9 fL (ref 80.0–100.0)
Monocytes Absolute: 0.5 K/uL (ref 0.1–1.0)
Monocytes Relative: 4 %
Neutro Abs: 9.5 K/uL — ABNORMAL HIGH (ref 1.7–7.7)
Neutrophils Relative %: 82 %
Platelets: 361 K/uL (ref 150–400)
RBC: 4.74 MIL/uL (ref 3.87–5.11)
RDW: 14.2 % (ref 11.5–15.5)
WBC: 11.6 K/uL — ABNORMAL HIGH (ref 4.0–10.5)
nRBC: 0 % (ref 0.0–0.2)

## 2023-11-19 LAB — HIV ANTIBODY (ROUTINE TESTING W REFLEX): HIV Screen 4th Generation wRfx: NONREACTIVE

## 2023-11-19 LAB — LACTIC ACID, PLASMA
Lactic Acid, Venous: 1.9 mmol/L (ref 0.5–1.9)
Lactic Acid, Venous: 1 mmol/L (ref 0.5–1.9)

## 2023-11-19 LAB — GLUCOSE, CAPILLARY: Glucose-Capillary: 125 mg/dL — ABNORMAL HIGH (ref 70–99)

## 2023-11-19 SURGERY — LAPAROTOMY, EXPLORATORY
Anesthesia: General

## 2023-11-19 MED ORDER — LACTATED RINGERS IV SOLN: INTRAVENOUS | Status: AC

## 2023-11-19 MED ORDER — PHENOL 1.4 % MT LIQD
1.0000 | OROMUCOSAL | Status: DC | PRN
  Administered 2023-11-19: 1 via OROMUCOSAL
  Filled 2023-11-19: qty 177

## 2023-11-19 MED ORDER — ONDANSETRON HCL 4 MG/2ML IJ SOLN
4.0000 mg | Freq: Four times a day (QID) | INTRAMUSCULAR | Status: DC | PRN
  Administered 2023-11-19: 4 mg via INTRAVENOUS
  Filled 2023-11-19: qty 2

## 2023-11-19 MED ORDER — KETOROLAC TROMETHAMINE 15 MG/ML IJ SOLN
15.0000 mg | Freq: Four times a day (QID) | INTRAMUSCULAR | Status: DC | PRN
  Administered 2023-11-19 – 2023-11-20 (×2): 15 mg via INTRAVENOUS
  Filled 2023-11-19 (×2): qty 1

## 2023-11-19 MED ORDER — ENOXAPARIN SODIUM 80 MG/0.8ML IJ SOSY
70.0000 mg | PREFILLED_SYRINGE | Freq: Every day | INTRAMUSCULAR | Status: DC
  Administered 2023-11-19 – 2023-11-20 (×2): 70 mg via SUBCUTANEOUS
  Filled 2023-11-19 (×3): qty 0.8

## 2023-11-19 MED ORDER — HYDROMORPHONE HCL 1 MG/ML IJ SOLN
1.0000 mg | INTRAMUSCULAR | Status: DC | PRN
  Administered 2023-11-19 (×5): 1 mg via INTRAVENOUS
  Filled 2023-11-19 (×5): qty 1

## 2023-11-19 MED ORDER — DIATRIZOATE MEGLUMINE & SODIUM 66-10 % PO SOLN
90.0000 mL | Freq: Once | ORAL | Status: AC
  Administered 2023-11-19: 90 mL via NASOGASTRIC
  Filled 2023-11-19: qty 90

## 2023-11-19 NOTE — Progress Notes (Signed)
 Subjective: She reports that her pain has improved significantly since placement of the NGT. No flatus or BM since admission but did move her bowels after the pain started. Lactate 1.9 this AM. WBC 12 from 14. She is AF and HDS  ROS: See above, otherwise other systems negative  Objective: Vital signs in last 24 hours: Temp:  [97.6 F (36.4 C)-98.7 F (37.1 C)] 97.6 F (36.4 C) (08/20 0723) Pulse Rate:  [64-96] 64 (08/20 0723) Resp:  [14-40] 16 (08/20 0723) BP: (118-152)/(62-93) 122/72 (08/20 0723) SpO2:  [85 %-98 %] 96 % (08/20 0723) Weight:  [145.2 kg] 145.2 kg (08/19 2000) Last BM Date : 11/18/23  Intake/Output from previous day: 08/19 0701 - 08/20 0700 In: 1536 [NG/GT:30; IV Piggyback:1506] Out: -  Intake/Output this shift: No intake/output data recorded.  PE: Gen: female, NAD Abd: soft, non-distended, large ventral hernia that is not easily reducible but is freely mobile, mild TTP in the central abdomen, no rebound/guarding, no peritoneal signs  Lab Results:  Recent Labs    11/18/23 2002 11/19/23 0526  WBC 14.2* 11.6*  HGB 14.2 13.3  HCT 43.6 41.2  PLT 455* 361   BMET Recent Labs    11/18/23 2002 11/19/23 0526  NA 137 137  K 3.9 4.5  CL 101 100  CO2 22 22  GLUCOSE 137* 151*  BUN 15 14  CREATININE 0.74 0.74  CALCIUM  9.5 9.2   PT/INR No results for input(s): LABPROT, INR in the last 72 hours. CMP     Component Value Date/Time   NA 137 11/19/2023 0526   NA 140 08/06/2022 1224   K 4.5 11/19/2023 0526   CL 100 11/19/2023 0526   CO2 22 11/19/2023 0526   GLUCOSE 151 (H) 11/19/2023 0526   BUN 14 11/19/2023 0526   BUN 13 08/06/2022 1224   CREATININE 0.74 11/19/2023 0526   CREATININE 0.49 09/03/2023 1142   CALCIUM  9.2 11/19/2023 0526   PROT 6.7 11/19/2023 0526   PROT 7.4 08/06/2022 1224   ALBUMIN  3.3 (L) 11/19/2023 0526   ALBUMIN  4.6 08/06/2022 1224   AST 20 11/19/2023 0526   AST 12 (L) 09/03/2023 1142   ALT 17 11/19/2023 0526   ALT  11 09/03/2023 1142   ALKPHOS 72 11/19/2023 0526   BILITOT 0.2 11/19/2023 0526   BILITOT 0.3 09/03/2023 1142   GFRNONAA >60 11/19/2023 0526   GFRNONAA >60 09/03/2023 1142   GFRAA >60 01/18/2019 1014   Lipase     Component Value Date/Time   LIPASE 27 11/18/2023 2002    Studies/Results: DG Abd Portable 1 View Result Date: 11/19/2023 CLINICAL DATA:  Admitted for small bowel obstruction, post NGT insertion. Check insertion. EXAM: PORTABLE ABDOMEN - 1 VIEW COMPARISON:  CT abdomen and pelvis yesterday at 10:47 p.m. FINDINGS: 12:42 a.m. NGT interval insertion with tip in the body of the stomach, adequately inserted. There is moderate gas distension of the stomach. Current exam includes only the upper abdomen with no dilated upper abdominal small bowel being seen. There is contrast in the renal collecting systems. No free air is seen in the upper abdomen. Lung bases are clear. Mild cardiomegaly. Left chest wall surgical clips. IMPRESSION: 1. NGT tip in the body of the stomach, adequately inserted. 2. Moderate gas distension of the stomach. Electronically Signed   By: Francis Quam M.D.   On: 11/19/2023 01:04   CT ABDOMEN PELVIS W CONTRAST Result Date: 11/18/2023 CLINICAL DATA:  Bowel obstruction suspected LLQ abdominal pain  suspected incarcerated hernia EXAM: CT ABDOMEN AND PELVIS WITH CONTRAST TECHNIQUE: Multidetector CT imaging of the abdomen and pelvis was performed using the standard protocol following bolus administration of intravenous contrast. RADIATION DOSE REDUCTION: This exam was performed according to the departmental dose-optimization program which includes automated exposure control, adjustment of the mA and/or kV according to patient size and/or use of iterative reconstruction technique. CONTRAST:  OMNIPAQUE  IOHEXOL  350 MG/ML SOLN COMPARISON:  CT virtual colonoscopy 09/06/2021 trauma CT abdomen pelvis 06/14/2020. FINDINGS: Lower chest: No acute abnormality. Hepatobiliary: No focal  liver abnormality. No gallstones, gallbladder wall thickening, or pericholecystic fluid. No biliary dilatation. Pancreas: No focal lesion. Normal pancreatic contour. No surrounding inflammatory changes. No main pancreatic ductal dilatation. Spleen: Normal in size without focal abnormality. Adrenals/Urinary Tract: Interval increase in size of a 2.2 cm (from 1.8 cm) left adrenal gland nodule with a density of 57 Hounsfield units. Bilateral kidneys enhance symmetrically. No hydronephrosis. No hydroureter. The urinary bladder is unremarkable. Stomach/Bowel: Stomach is within normal limits. No evidence of large bowel wall thickening or dilatation. Dilatation of a short segment of small bowel within the left portion of the abdominal hernia (2.3 cm in caliber) with fecalized material within the lumen. No pneumatosis. Colonic diverticulosis. Vague fat stranding along a mid sigmoid diverticula (7:82). Appendix appears normal. Vascular/Lymphatic: No abdominal aorta or iliac aneurysm. Mild to moderate atherosclerotic plaque of the aorta and its branches. No abdominal, pelvic, or inguinal lymphadenopathy. Reproductive: Uterus and bilateral adnexa are unremarkable. Other: Trace free fluid distal to the obstructed loop of small bowel. No intraperitoneal free gas. No organized fluid collection. Musculoskeletal: Large lobulated infraumbilical ventral hernia containing the transverse colon, ascending colon, appendix, and medullary of the small bowel. No suspicious lytic or blastic osseous lesions. No acute displaced fracture. Multilevel degenerative changes of the spine. IMPRESSION: 1. Short segment small bowel obstruction in the setting of a large lobulated infraumbilical ventral hernia. Dilatation of a short segment of small bowel within the left portion of the abdominal hernia (2.3 cm in caliber) with fecalized material within the lumen. Associated trace free fluid within the hernia along the obstructed small bowel. 2. Colonic  diverticulosis with vague fat stranding along a focal mid sigmoid diverticula. No associated bowel wall thickening. Findings could represent developing diverticulitis. 3. Interval increase in size of a 2.2 cm (from 1.8 cm) left adrenal gland nodule. Probable adenoma. Recommend adrenal washout CT or chemical shift MRI. JACR 2017 Aug; 14(8):1038-44, JCAT 2016 Mar-Apr; 40(2):194-200, Urol J 2006 Spring; 3(2):71-4. 4.  Aortic Atherosclerosis (ICD10-I70.0). Electronically Signed   By: Morgane  Naveau M.D.   On: 11/18/2023 23:10    Anti-infectives: Anti-infectives (From admission, onward)    None       Assessment/Plan  58 y/o F w/ a large recurrent ventral hernia w/ SBO within the hernia contents  I had a long discussion with the patient, her husband, and her son-in-law. Based on her CT, it appears that she has an obstruction within the hernia contents of her large ventral hernia. There were findings to suggest potential bowel compromise, but it is reassuring that her pain has improved and that her lactate is stable. I explained that with conservative management, I do have concerns that she would be at an increased risk for recurrence of her obstruction. I offered to take her to the OR for exploration with planned LOA and possible hernia repair without mesh if feasible. She would almost certainly have a hernia recurrence. She is hesitant to pursue surgery and is  concerned about a delay in breast radiation scheduled for 2 weeks. - Will initiate SB protocol - Repeat lactate this afternoon - Monitor abdominal pain - Surgery will follow    LOS: 0 days   Cordella DELENA Polly Marlis Cheron Surgery 11/19/2023, 7:46 AM Please see Amion for pager number during day hours 7:00am-4:30pm or 7:00am -11:30am on weekends

## 2023-11-19 NOTE — Progress Notes (Signed)
   11/19/23 1941  BiPAP/CPAP/SIPAP  Reason BIPAP/CPAP not in use NG tube in place  BiPAP/CPAP /SiPAP Vitals  Resp 18  SpO2 91 %  Bilateral Breath Sounds Diminished  MEWS Score/Color  MEWS Score 0  MEWS Score Color [place]

## 2023-11-19 NOTE — Progress Notes (Signed)
 [facility] Progress Note Patient: Chelsea Soto FMW:[id number] DOB: [birth year] DOA: 11/18/2023  DOS: the patient was seen and examined on 11/19/2023  Brief Hospital Course: PMH of breast cancer SP lumpectomy, OSA on BiPAP, HTN, HLD presented to the hospital with complaints of abdominal pain. Has chronic abdominal hernia now with small bowel obstruction. General surgery consulted. Patient opting for conservative measures for now.  Assessment and Plan: Small bowel obstruction with incarcerated ventral hernia. Appreciate general surgery consultation. Has an NG tube. Remains NPO. Gentle IV hydration. Surgery recommended exploratory laparotomy with LOA and hernia repair without mesh although she would prefer to hold off on the surgery. She tells me that she is planning to get her surgery for the hernia repair after she loses some weight for successful mesh placement. She is planning to get Zepbound  for weight loss. Recommend her to discuss with general surgery with regards to that medication given significant GI side effects.  Breast cancer. Status post lumpectomy. Monitor for now.  Obstructive sleep apnea. On BiPAP at home. Currently on hold secondary to NG tube.  HTN. On lisinopril  HCTZ. Currently on hold for now.  Obesity Class 3 Body mass index is 56.69 kg/m.  Placing the pt at higher risk of poor outcomes.  Lactic acidosis and leukocytosis. In the setting of obstruction. Currently improving.  Headache. Patient reports some frontal headache. Will initiate Toradol .  HLD. On Crestor .  Currently on hold.  Chronic diverticulosis. There is some reported fat stranding seen on the sigmoid diverticula. If the patient continues to have abdominal pain or worsening leukocytosis or fever we will have low threshold to initiate antibiotic for concern for diverticulitis.  Left adrenal adenoma. Increasing in size compared to prior CT scan from 1.8 cm to 2.2  cm. Recommendation is for adrenal washout CT scan or chemical shift MRI.  Would recommend this outpatient once she recovers from the current presentation.   Subjective: No nausea no vomiting no fever no chills.  Abdominal pain still present on the left side.  Passing gas.  Physical Exam: Clear to auscultation. S1-S2 present  bowel sound present. No edema.  Data Reviewed: I have Reviewed nursing notes, Vitals, and Lab results. Since last encounter, pertinent lab results CBC and BMP   . I have ordered test including CBC and BMP  .  Disposition: Status is: Inpatient Remains inpatient appropriate because: Monitor for improvement in abdominal pain  SCDs Start: 11/19/23 0405   Family Communication: Family at bedside.  Patient provided permission. Level of care: Telemetry Medical   Vitals:   11/19/23 0100 11/19/23 0130 11/19/23 0723 11/19/23 1559  BP: 130/62 124/63 122/72 99/61  Pulse: 71 71 64 78  Resp: 14 18 16 16   Temp:   97.6 F (36.4 C) 98.1 F (36.7 C)  TempSrc:   Oral Oral  SpO2: (!) 85% (!) 88% 96% 90%  Weight:      Height:         Author: Yetta Blanch, MD 11/19/2023 7:15 PM  Please look on [email or web address] to find out who is on call.

## 2023-11-19 NOTE — TOC Initial Note (Signed)
 Transition of Care [facility]) - Initial/Assessment Note    Patient Details  Name: Chelsea Soto MRN: [id number] Date of Birth: 1965/09/18  Transition of Care [facility]) CM/SW Contact:    Jeoffrey LITTIE Maranda ISRAEL Phone Number: 11/19/2023, 8:31 AM  Clinical Narrative:                 Pt admitted from home for breast lumpectomy. No current TOC needs please consult as needs arise.         Patient Goals and CMS Choice            Expected Discharge Plan and Services                                              Prior Living Arrangements/Services                       Activities of Daily Living   ADL Screening (condition at time of admission) Independently performs ADLs?: Yes (appropriate for developmental age) Is the patient deaf or have difficulty hearing?: No Does the patient have difficulty seeing, even when wearing glasses/contacts?: No Does the patient have difficulty concentrating, remembering, or making decisions?: No  Permission Sought/Granted                  Emotional Assessment              Admission diagnosis:  Ventral hernia without obstruction or gangrene [K43.9] Small bowel obstruction (HCC) [K56.609] SBO (small bowel obstruction) (HCC) [K56.609] LLQ abdominal pain [R10.32] Patient Active Problem List   Diagnosis Date Noted   SBO (small bowel obstruction) (HCC) 11/19/2023   Small bowel obstruction (HCC) 11/19/2023   BRIP1 gene mutation positive 09/22/2023   Genetic testing 09/15/2023   Malignant neoplasm of upper-inner quadrant of left breast in female, estrogen receptor positive (HCC) 09/01/2023   OSA on CPAP 05/01/2023   Positive colorectal cancer screening using Cologuard test 03/20/2023   Benign neoplasm of transverse colon 03/20/2023   Benign neoplasm of descending colon 03/20/2023   Benign neoplasm of sigmoid colon 03/20/2023   Benign neoplasm of rectum 03/20/2023   Constipation 11/12/2022   Coronary artery  calcification seen on CAT scan 08/06/2022   Mixed hyperlipidemia 08/06/2022   Primary hypertension 08/06/2022   Class 3 severe obesity with serious comorbidity and body mass index (BMI) of 50.0 to 59.9 in adult 08/06/2022   Prediabetes 08/06/2022   Other fatigue 08/06/2022   SOB (shortness of breath) on exertion 08/06/2022   Incarcerated hernia 09/14/2020   PCP:  Chrystal Lamarr RAMAN, MD Pharmacy:   [facility] [address] [place] [address] Phone: [phone] Fax: [phone]     Social Drivers of Health (SDOH) Social History: SDOH Screenings   Food Insecurity: No Food Insecurity (11/19/2023)  Housing: Low Risk  (11/19/2023)  Transportation Needs: No Transportation Needs (11/19/2023)  Utilities: Not At Risk (11/19/2023)  Depression (PHQ2-9): Low Risk  (09/03/2023)  Financial Resource Strain: Low Risk  (11/10/2023)   Received from [facility]  Physical Activity: Inactive (11/10/2023)   Received from [facility]  Social Connections: Moderately Integrated (11/10/2023)   Received from [facility]  Stress: Stress Concern Present (11/10/2023)   [facility]  Tobacco Use: Low Risk  (11/19/2023)   SDOH Interventions:     Readmission Risk Interventions  No data to display

## 2023-11-19 NOTE — Plan of Care (Signed)

## 2023-11-19 NOTE — ED Notes (Signed)
 Approximately of tea colored fluid collected in suction container.

## 2023-11-19 NOTE — Consult Note (Incomplete)
 Reason for Consult/Chief Complaint: *** Consultant: ***, {Blank single:19197::DO,PA,MD}  Chelsea Soto is an 58 y.o. female.   HPI: *** Abd pain, n/v Last BM 1630 UHR 2022 Lumpectomy 2w ago by [facility]  Past Medical History:  Diagnosis Date  . Anxiety   . Aortic atherosclerosis (HCC)   . Back pain   . Breast cancer (HCC)   . Constipation   . Depression   . Diverticulosis   . Dyspnea    chronic (with exertion)  . Edema of both lower extremities   . GERD (gastroesophageal reflux disease)   . Hiatal hernia   . Hip pain   . Hypercholesterolemia   . Hypertension   . Joint pain   . Knee pain   . Mood swings   . Morbid obesity with BMI of 50.0-59.9, adult (HCC)   . Post-menopausal bleeding   . Prediabetes   . Sleep apnea    Severe, on BiPAP  . SOB (shortness of breath)   . Umbilical hernia   . Ventral hernia   . Ventral hernia   . Vitamin D  deficiency   . Wears contact lenses     Past Surgical History:  Procedure Laterality Date  . BREAST BIOPSY Left 08/26/2023   US  LT BREAST BX W LOC DEV 1ST LESION IMG BX SPEC US  GUIDE 08/26/2023 [facility]  . BREAST BIOPSY Left 10/28/2023   US  LT RADIOACTIVE SEED LOC 10/28/2023 [facility]  . BREAST LUMPECTOMY WITH RADIOACTIVE SEED AND SENTINEL LYMPH NODE BIOPSY Left 10/30/2023   Procedure: BREAST LUMPECTOMY WITH RADIOACTIVE SEED;  Surgeon: Vanderbilt Ned, MD;  Location: [facility] OR;  Service: General;  Laterality: Left;  LEFT BREAST SEED LUMPECTOMY LEFT AXILLARY SENTINEL LYMPH NODE MAPPING  . COLONOSCOPY    . COLONOSCOPY WITH PROPOFOL  N/A 03/20/2023   Procedure: COLONOSCOPY WITH PROPOFOL ;  Surgeon: Albertus Gordy HERO, MD;  Location: [facility] ENDOSCOPY;  Service: Gastroenterology;  Laterality: N/A;  . DILATATION & CURETTAGE/HYSTEROSCOPY WITH MYOSURE N/A 01/21/2019   Procedure: DILATATION & CURETTAGE/HYSTEROSCOPY WITH possible  MYOSURE;  Surgeon: Latisha Medford, MD;  Location: [facility] OR;  Service: Gynecology;  Laterality: N/A;   . LAPAROSCOPY N/A 09/14/2020   Procedure: DIAGNOSTIC LAPAROSCOPY;  Surgeon: Teresa Lonni HERO, MD;  Location: [facility] OR;  Service: General;  Laterality: N/A;  . LYSIS OF ADHESION N/A 09/14/2020   Procedure: LYSIS OF ADHESION;  Surgeon: Teresa Lonni HERO, MD;  Location[facility] OR;  Service: General;  Laterality: N/A;  . POLYPECTOMY  03/20/2023   Procedure: POLYPECTOMY;  Surgeon: Albertus Gordy HERO, MD;  Location: [facility] ENDOSCOPY;  Service: Gastroenterology;;  . SENTINEL NODE BIOPSY Left 10/30/2023   Procedure: BIOPSY, LYMPH NODE, SENTINEL;  Surgeon: Vanderbilt Ned, MD;  Location: [facility] OR;  Service: General;  Laterality: Left;  SABRA VENTRAL HERNIA REPAIR N/A 09/14/2020   Procedure: OPEN REPAIR OF INCARCERATED VENTRAL HERNIA  ADULT;  Surgeon: Teresa Lonni HERO, MD;  Location: [facility] OR;  Service: General;  Laterality: N/A;  . WISDOM TOOTH EXTRACTION      Family History  Problem Relation Age of Onset  . Breast cancer Mother 93  . Colon polyps Mother   . Diabetes Mother   . Hypertension Mother   . Heart disease Mother   . Depression Mother   . Anxiety disorder Mother   . Obesity Mother   . CAD Father   . Hypertension Father   . Hyperlipidemia Father   . Heart disease Father   . Sleep apnea Father   . Other Sister  BRIP1+  . Stroke Sister   . Colon cancer Maternal Grandmother 51  . Breast cancer Maternal Grandmother 33  . Cancer Maternal Grandfather        unknown type  . Colon cancer Maternal Uncle 45  . Esophageal cancer Neg Hx   . Rectal cancer Neg Hx   . Stomach cancer Neg Hx     Social History:  reports that she has never smoked. She has never used smokeless tobacco. She reports current alcohol use. She reports that she does not use drugs.  Allergies: No Known Allergies  Medications: I have reviewed the patient's current medications.  Results for orders placed or performed during the hospital encounter of 11/18/23 (from the past 48 hours)  Lipase, blood     Status: None    Collection Time: 11/18/23  8:02 PM  Result Value Ref Range   Lipase 27 11 - 51 U/L    Comment: Performed at [facility], [address] [place], [place] [address]  Comprehensive metabolic panel     Status: Abnormal   Collection Time: 11/18/23  8:02 PM  Result Value Ref Range   Sodium 137 135 - 145 mmol/L   Potassium 3.9 3.5 - 5.1 mmol/L   Chloride 101 98 - 111 mmol/L   CO2 22 22 - 32 mmol/L   Glucose, Bld 137 (H) 70 - 99 mg/dL    Comment: Glucose reference range applies only to samples taken after fasting for at least 8 hours.   BUN 15 6 - 20 mg/dL   Creatinine, Ser 9.25 0.44 - 1.00 mg/dL   Calcium  9.5 8.9 - 10.3 mg/dL   Total Protein 7.4 6.5 - 8.1 g/dL   Albumin  3.6 3.5 - 5.0 g/dL   AST 21 15 - 41 U/L   ALT 17 0 - 44 U/L   Alkaline Phosphatase 86 38 - 126 U/L   Total Bilirubin 0.4 0.0 - 1.2 mg/dL   GFR, Estimated >39 >39 mL/min    Comment: (NOTE) Calculated using the CKD-EPI Creatinine Equation (2021)    Anion gap 14 5 - 15    Comment: Performed at [facility], [address] [place], [place] [address]  CBC     Status: Abnormal   Collection Time: 11/18/23  8:02 PM  Result Value Ref Range   WBC 14.2 (H) 4.0 - 10.5 K/uL   RBC 5.17 (H) 3.87 - 5.11 MIL/uL   Hemoglobin 14.2 12.0 - 15.0 g/dL   HCT 56.3 63.9 - 53.9 %   MCV 84.3 80.0 - 100.0 fL   MCH 27.5 26.0 - 34.0 pg   MCHC 32.6 30.0 - 36.0 g/dL   RDW 86.1 88.4 - 84.4 %   Platelets 455 (H) 150 - 400 K/uL   nRBC 0.0 0.0 - 0.2 %    Comment: Performed at [facility], [address] [place], [place] [address]  Lactic acid, plasma     Status: Abnormal   Collection Time: 11/18/23  9:27 PM  Result Value Ref Range   Lactic Acid, Venous 2.0 (HH) 0.5 - 1.9 mmol/L    Comment: CRITICAL RESULT CALLED TO, READ BACK BY AND VERIFIED WITH MORRISON, T. RN @2226  11/18/23 SATRAINR Performed at [facility], [address] [place], [place] [address]     CT ABDOMEN PELVIS W CONTRAST Result Date:  11/18/2023 CLINICAL DATA:  Bowel obstruction suspected LLQ abdominal pain suspected incarcerated hernia EXAM: CT ABDOMEN AND PELVIS WITH CONTRAST TECHNIQUE: Multidetector  CT imaging of the abdomen and pelvis was performed using the standard protocol following bolus administration of intravenous contrast. RADIATION DOSE REDUCTION: This exam was performed according to the departmental dose-optimization program which includes automated exposure control, adjustment of the mA and/or kV according to patient size and/or use of iterative reconstruction technique. CONTRAST:  OMNIPAQUE  IOHEXOL  350 MG/ML SOLN COMPARISON:  CT virtual colonoscopy 09/06/2021 trauma CT abdomen pelvis 06/14/2020. FINDINGS: Lower chest: No acute abnormality. Hepatobiliary: No focal liver abnormality. No gallstones, gallbladder wall thickening, or pericholecystic fluid. No biliary dilatation. Pancreas: No focal lesion. Normal pancreatic contour. No surrounding inflammatory changes. No main pancreatic ductal dilatation. Spleen: Normal in size without focal abnormality. Adrenals/Urinary Tract: Interval increase in size of a 2.2 cm (from 1.8 cm) left adrenal gland nodule with a density of 57 Hounsfield units. Bilateral kidneys enhance symmetrically. No hydronephrosis. No hydroureter. The urinary bladder is unremarkable. Stomach/Bowel: Stomach is within normal limits. No evidence of large bowel wall thickening or dilatation. Dilatation of a short segment of small bowel within the left portion of the abdominal hernia (2.3 cm in caliber) with fecalized material within the lumen. No pneumatosis. Colonic diverticulosis. Vague fat stranding along a mid sigmoid diverticula (7:82). Appendix appears normal. Vascular/Lymphatic: No abdominal aorta or iliac aneurysm. Mild to moderate atherosclerotic plaque of the aorta and its branches. No abdominal, pelvic, or inguinal lymphadenopathy. Reproductive: Uterus and bilateral adnexa are unremarkable. Other: Trace  free fluid distal to the obstructed loop of small bowel. No intraperitoneal free gas. No organized fluid collection. Musculoskeletal: Large lobulated infraumbilical ventral hernia containing the transverse colon, ascending colon, appendix, and medullary of the small bowel. No suspicious lytic or blastic osseous lesions. No acute displaced fracture. Multilevel degenerative changes of the spine. IMPRESSION: 1. Short segment small bowel obstruction in the setting of a large lobulated infraumbilical ventral hernia. Dilatation of a short segment of small bowel within the left portion of the abdominal hernia (2.3 cm in caliber) with fecalized material within the lumen. Associated trace free fluid within the hernia along the obstructed small bowel. 2. Colonic diverticulosis with vague fat stranding along a focal mid sigmoid diverticula. No associated bowel wall thickening. Findings could represent developing diverticulitis. 3. Interval increase in size of a 2.2 cm (from 1.8 cm) left adrenal gland nodule. Probable adenoma. Recommend adrenal washout CT or chemical shift MRI. JACR 2017 Aug; 14(8):1038-44, JCAT 2016 Mar-Apr; 40(2):194-200, Urol J 2006 Spring; 3(2):71-4. 4.  Aortic Atherosclerosis (ICD10-I70.0). Electronically Signed   By: Morgane  Naveau M.D.   On: 11/18/2023 23:10    ROS 10 point review of systems is negative except as listed above in HPI.   Physical Exam Blood pressure (!) 152/72, pulse 75, temperature 98.7 F (37.1 C), temperature source Oral, resp. rate 15, height 5' 3 (1.6 m), weight (!) 145.2 kg, SpO2 98%. Constitutional: well-developed, well-nourished*** HEENT: pupils equal, round, reactive to light, 2***mm b/l, moist conjunctiva, external inspection of ears and nose normal, hearing {Blank single:19197::intact,diminished,unable to be assessed} Oropharynx: normal oropharyngeal mucosa, {Blank single:19197::normal,poor} dentition Neck: no thyromegaly, trachea midline, {Blank  single:19197::+,no,unable to assess} midline cervical tenderness to palpation Chest: breath sounds equal bilaterally, {Blank single:19197::normal,absent,labored} respiratory effort, {Blank single:19197::+,no} midline or lateral chest wall tenderness to palpation/deformity Abdomen: soft, NT, no bruising, no hepatosplenomegaly GU: {Blank single:19197::no blood at urethral meatus of penis, no scrotal masses or abnormality,normal female genitalia}  Back: no wounds, {Blank single:19197::+,no,unable to assess} thoracic/lumbar spine tenderness to palpation, {Blank single:19197::+,no} thoracic/lumbar spine stepoffs Rectal: {Blank single:19197::good tone, no blood,deferred} Extremities:  2+ radial and pedal pulses bilaterally, {Blank single:19197::intact,unable to assess} motor and sensation bilateral UE and LE, {Blank single:19197::+,no} peripheral edema MSK: {Blank single:19197::normal,abnormal,unable to assess} gait/station, no clubbing/cyanosis of fingers/toes, {Blank single:19197::normal,limited,unable to assess} ROM of all four extremities Skin: warm, dry, no rashes Psych: {Blank single:19197::normal memory, normal mood/affect,unable to assess}     Assessment/Plan: ***  *** -  FEN - {diet:29922} DVT - SCDs, {Blank single:19197::LMWH,LMWH 40BID,SQH,hold chemical ppx due to bleeding concerns} Dispo - {Blank single:19197::ICU,4NP,med-surg,***}    Dreama GEANNIE Hanger, MD General and Trauma Surgery [facility]

## 2023-11-19 NOTE — H&P (Signed)
 History and Physical    Chelsea Soto FMW:[id number] DOB: Feb 29, 1966 DOA: 11/18/2023  Patient coming from: Home.  Chief Complaint: Abdominal pain.  HPI: Chelsea Soto is a 58 y.o. female with history of recent diagnosis of breast cancer status postlumpectomy, sleep apnea on BiPAP, hypertension, hyperlipidemia presents to the ER with complaint of abdominal pain.  Patient started developing acute onset of abdominal pain after physical therapy session last evening.  Patient has prior history of ventral hernia repair in 2022.  Patient is also referred to [facility] for abdominal wall wrist construction for which patient was to undergo weight loss.  Denies any chest pain or shortness of breath denies any nausea vomiting.  Pain is mostly located around the central abdomen where the hernia is present.  ED Course: In the ER patient had CT abdomen pelvis which shows short segment small bowel obstruction in the setting of large lobulated infraumbilical ventral hernia.  Dilation of proximal end of small bowel within the left portion of the abdominal hernia along with obstruction of the small bowel.  General surgery was consulted patient was placed on NG tube.  Lactic acid was elevated.  General surgery is planning to give fluids and if lactic acid remains elevated may go for surgery.  Labs show leukocytosis.    Review of Systems: As per HPI, rest all negative.   Past Medical History:  Diagnosis Date   Anxiety    Aortic atherosclerosis (HCC)    Back pain    Breast cancer (HCC)    Constipation    Depression    Diverticulosis    Dyspnea    chronic (with exertion)   Edema of both lower extremities    GERD (gastroesophageal reflux disease)    Hiatal hernia    Hip pain    Hypercholesterolemia    Hypertension    Joint pain    Knee pain    Mood swings    Morbid obesity with BMI of 50.0-59.9, adult (HCC)    Post-menopausal bleeding    Prediabetes    Sleep apnea    Severe, on BiPAP   SOB  (shortness of breath)    Umbilical hernia    Ventral hernia    Ventral hernia    Vitamin D  deficiency    Wears contact lenses     Past Surgical History:  Procedure Laterality Date   BREAST BIOPSY Left 08/26/2023   US  LT BREAST BX W LOC DEV 1ST LESION IMG BX SPEC US  GUIDE 08/26/2023 [facility]   BREAST BIOPSY Left 10/28/2023   US  LT RADIOACTIVE SEED LOC 10/28/2023 [facility]   BREAST LUMPECTOMY WITH RADIOACTIVE SEED AND SENTINEL LYMPH NODE BIOPSY Left 10/30/2023   Procedure: BREAST LUMPECTOMY WITH RADIOACTIVE SEED;  Surgeon: Vanderbilt Ned, MD;  Location: [facility] OR;  Service: General;  Laterality: Left;  LEFT BREAST SEED LUMPECTOMY LEFT AXILLARY SENTINEL LYMPH NODE MAPPING   COLONOSCOPY     COLONOSCOPY WITH PROPOFOL  N/A 03/20/2023   Procedure: COLONOSCOPY WITH PROPOFOL ;  Surgeon: Albertus Gordy HERO, MD;  Location: [facility] ENDOSCOPY;  Service: Gastroenterology;  Laterality: N/A;   DILATATION & CURETTAGE/HYSTEROSCOPY WITH MYOSURE N/A 01/21/2019   Procedure: DILATATION & CURETTAGE/HYSTEROSCOPY WITH possible  MYOSURE;  Surgeon: Latisha Medford, MD;  Location: [facility] OR;  Service: Gynecology;  Laterality: N/A;   LAPAROSCOPY N/A 09/14/2020   Procedure: DIAGNOSTIC LAPAROSCOPY;  Surgeon: Teresa Lonni HERO, MD;  Location: [facility] OR;  Service: General;  Laterality: N/A;   LYSIS OF ADHESION N/A 09/14/2020   Procedure: LYSIS OF  ADHESION;  Surgeon: Teresa Lonni HERO, MD;  Location: [facility] OR;  Service: General;  Laterality: N/A;   POLYPECTOMY  03/20/2023   Procedure: POLYPECTOMY;  Surgeon: Albertus Gordy HERO, MD;  Location: [facility] ENDOSCOPY;  Service: Gastroenterology;;   SENTINEL NODE BIOPSY Left 10/30/2023   Procedure: BIOPSY, LYMPH NODE, SENTINEL;  Surgeon: Vanderbilt Ned, MD;  Location: [facility] OR;  Service: General;  Laterality: Left;   VENTRAL HERNIA REPAIR N/A 09/14/2020   Procedure: OPEN REPAIR OF INCARCERATED VENTRAL HERNIA  ADULT;  Surgeon: Teresa Lonni HERO, MD;  Location: [facility] OR;  Service: General;   Laterality: N/A;   WISDOM TOOTH EXTRACTION       reports that she has never smoked. She has never used smokeless tobacco. She reports current alcohol use. She reports that she does not use drugs.  No Known Allergies  Family History  Problem Relation Age of Onset   Breast cancer Mother 80   Colon polyps Mother    Diabetes Mother    Hypertension Mother    Heart disease Mother    Depression Mother    Anxiety disorder Mother    Obesity Mother    CAD Father    Hypertension Father    Hyperlipidemia Father    Heart disease Father    Sleep apnea Father    Other Sister        BRIP1+   Stroke Sister    Colon cancer Maternal Grandmother 36   Breast cancer Maternal Grandmother 53   Cancer Maternal Grandfather        unknown type   Colon cancer Maternal Uncle 45   Esophageal cancer Neg Hx    Rectal cancer Neg Hx    Stomach cancer Neg Hx     Prior to Admission medications   Medication Sig Start Date End Date Taking? Authorizing Provider  dicyclomine  (BENTYL ) 10 MG capsule Take 1 capsule (10 mg total) by mouth 4 (four) times daily as needed. 08/28/22  Yes   lisinopril -hydrochlorothiazide  (ZESTORETIC ) 20-25 MG tablet Take 1 tablet by mouth daily. 10/28/23  Yes   Multiple Vitamin (MULTIVITAMIN WITH MINERALS) TABS tablet Take 1 tablet by mouth daily.   Yes [provider]  rosuvastatin  (CRESTOR ) 10 MG tablet Take 1 tablet (10 mg total) by mouth daily. 11/10/23  Yes   lisinopril -hydrochlorothiazide  (ZESTORETIC ) 20-25 MG tablet Take one tablet by mouth daily. 10/06/23       Physical Exam: Constitutional: Moderately built and nourished. Vitals:   11/18/23 2323 11/19/23 0030 11/19/23 0100 11/19/23 0130  BP: (!) 152/72 (!) 145/68 130/62 124/63  Pulse: 75 69 71 71  Resp: 15 19 14 18   Temp: 98.7 F (37.1 C)     TempSrc: Oral     SpO2: 98% 93% (!) 85% (!) 88%  Weight:      Height:       Eyes: Anicteric no pallor. ENMT: No discharge from the ears eyes nose or mouth. Neck: No  mass felt.  No neck rigidity. Respiratory: No rhonchi or limitations. Cardiovascular: S1-S2 heard. Abdomen: Ventral hernia seen large.  Nontender.  Bowel sounds not appreciated. Musculoskeletal: No edema. Skin: No rash. Neurologic: Alert awake oriented time place and person.  Moves all extremities. Psychiatric: Appears normal.  Normal affect.   Labs on Admission: I have personally reviewed following labs and imaging studies  CBC: Recent Labs  Lab 11/18/23 2002  WBC 14.2*  HGB 14.2  HCT 43.6  MCV 84.3  PLT 455*   Basic Metabolic Panel: Recent Labs  Lab  11/18/23 2002  NA 137  K 3.9  CL 101  CO2 22  GLUCOSE 137*  BUN 15  CREATININE 0.74  CALCIUM  9.5   GFR: Estimated Creatinine Clearance: 105.7 mL/min (by C-G formula based on SCr of 0.74 mg/dL). Liver Function Tests: Recent Labs  Lab 11/18/23 2002  AST 21  ALT 17  ALKPHOS 86  BILITOT 0.4  PROT 7.4  ALBUMIN  3.6   Recent Labs  Lab 11/18/23 2002  LIPASE 27   No results for input(s): AMMONIA in the last 168 hours. Coagulation Profile: No results for input(s): INR, PROTIME in the last 168 hours. Cardiac Enzymes: No results for input(s): CKTOTAL, CKMB, CKMBINDEX, TROPONINI in the last 168 hours. BNP (last 3 results) No results for input(s): PROBNP in the last 8760 hours. HbA1C: No results for input(s): HGBA1C in the last 72 hours. CBG: No results for input(s): GLUCAP in the last 168 hours. Lipid Profile: No results for input(s): CHOL, HDL, LDLCALC, TRIG, CHOLHDL, LDLDIRECT in the last 72 hours. Thyroid Function Tests: No results for input(s): TSH, T4TOTAL, FREET4, T3FREE, THYROIDAB in the last 72 hours. Anemia Panel: No results for input(s): VITAMINB12, FOLATE, FERRITIN, TIBC, IRON, RETICCTPCT in the last 72 hours. Urine analysis:    Component Value Date/Time   COLORURINE YELLOW 08/02/2021 0719   APPEARANCEUR CLEAR 08/02/2021 0719   LABSPEC 1.016  08/02/2021 0719   PHURINE 6.0 08/02/2021 0719   GLUCOSEU NEGATIVE 08/02/2021 0719   HGBUR NEGATIVE 08/02/2021 0719   BILIRUBINUR NEGATIVE 08/02/2021 0719   KETONESUR NEGATIVE 08/02/2021 0719   PROTEINUR NEGATIVE 08/02/2021 0719   NITRITE NEGATIVE 08/02/2021 0719   LEUKOCYTESUR NEGATIVE 08/02/2021 0719   Sepsis Labs: @LABRCNTIP (procalcitonin:4,lacticidven:4) )No results found for this or any previous visit (from the past 240 hours).   Radiological Exams on Admission: DG Abd Portable 1 View Result Date: 11/19/2023 CLINICAL DATA:  Admitted for small bowel obstruction, post NGT insertion. Check insertion. EXAM: PORTABLE ABDOMEN - 1 VIEW COMPARISON:  CT abdomen and pelvis yesterday at 10:47 p.m. FINDINGS: 12:42 a.m. NGT interval insertion with tip in the body of the stomach, adequately inserted. There is moderate gas distension of the stomach. Current exam includes only the upper abdomen with no dilated upper abdominal small bowel being seen. There is contrast in the renal collecting systems. No free air is seen in the upper abdomen. Lung bases are clear. Mild cardiomegaly. Left chest wall surgical clips. IMPRESSION: 1. NGT tip in the body of the stomach, adequately inserted. 2. Moderate gas distension of the stomach. Electronically Signed   By: Francis Quam M.D.   On: 11/19/2023 01:04   CT ABDOMEN PELVIS W CONTRAST Result Date: 11/18/2023 CLINICAL DATA:  Bowel obstruction suspected LLQ abdominal pain suspected incarcerated hernia EXAM: CT ABDOMEN AND PELVIS WITH CONTRAST TECHNIQUE: Multidetector CT imaging of the abdomen and pelvis was performed using the standard protocol following bolus administration of intravenous contrast. RADIATION DOSE REDUCTION: This exam was performed according to the departmental dose-optimization program which includes automated exposure control, adjustment of the mA and/or kV according to patient size and/or use of iterative reconstruction technique. CONTRAST:   OMNIPAQUE  IOHEXOL  350 MG/ML SOLN COMPARISON:  CT virtual colonoscopy 09/06/2021 trauma CT abdomen pelvis 06/14/2020. FINDINGS: Lower chest: No acute abnormality. Hepatobiliary: No focal liver abnormality. No gallstones, gallbladder wall thickening, or pericholecystic fluid. No biliary dilatation. Pancreas: No focal lesion. Normal pancreatic contour. No surrounding inflammatory changes. No main pancreatic ductal dilatation. Spleen: Normal in size without focal abnormality. Adrenals/Urinary Tract: Interval increase in  size of a 2.2 cm (from 1.8 cm) left adrenal gland nodule with a density of 57 Hounsfield units. Bilateral kidneys enhance symmetrically. No hydronephrosis. No hydroureter. The urinary bladder is unremarkable. Stomach/Bowel: Stomach is within normal limits. No evidence of large bowel wall thickening or dilatation. Dilatation of a short segment of small bowel within the left portion of the abdominal hernia (2.3 cm in caliber) with fecalized material within the lumen. No pneumatosis. Colonic diverticulosis. Vague fat stranding along a mid sigmoid diverticula (7:82). Appendix appears normal. Vascular/Lymphatic: No abdominal aorta or iliac aneurysm. Mild to moderate atherosclerotic plaque of the aorta and its branches. No abdominal, pelvic, or inguinal lymphadenopathy. Reproductive: Uterus and bilateral adnexa are unremarkable. Other: Trace free fluid distal to the obstructed loop of small bowel. No intraperitoneal free gas. No organized fluid collection. Musculoskeletal: Large lobulated infraumbilical ventral hernia containing the transverse colon, ascending colon, appendix, and medullary of the small bowel. No suspicious lytic or blastic osseous lesions. No acute displaced fracture. Multilevel degenerative changes of the spine. IMPRESSION: 1. Short segment small bowel obstruction in the setting of a large lobulated infraumbilical ventral hernia. Dilatation of a short segment of small bowel within the left  portion of the abdominal hernia (2.3 cm in caliber) with fecalized material within the lumen. Associated trace free fluid within the hernia along the obstructed small bowel. 2. Colonic diverticulosis with vague fat stranding along a focal mid sigmoid diverticula. No associated bowel wall thickening. Findings could represent developing diverticulitis. 3. Interval increase in size of a 2.2 cm (from 1.8 cm) left adrenal gland nodule. Probable adenoma. Recommend adrenal washout CT or chemical shift MRI. JACR 2017 Aug; 14(8):1038-44, JCAT 2016 Mar-Apr; 40(2):194-200, Urol J 2006 Spring; 3(2):71-4. 4.  Aortic Atherosclerosis (ICD10-I70.0). Electronically Signed   By: Morgane  Naveau M.D.   On: 11/18/2023 23:10    EKG: Independently reviewed.  Normal sinus rhythm.  Assessment/Plan Principal Problem:   SBO (small bowel obstruction) (HCC) Active Problems:   Incarcerated hernia   Mixed hyperlipidemia   Primary hypertension   OSA on CPAP   Malignant neoplasm of upper-inner quadrant of left breast in female, estrogen receptor positive (HCC)   Small bowel obstruction (HCC)    Small bowel obstruction with incarcerated ventral hernia.  General surgery consult appreciated.  Keep patient n.p.o. NG tube suction.  Await further recommendation from general surgery. Hypertension takes lisinopril  hydrochlorothiazide  at home.  Will keep patient on as needed IV hydralazine  follow blood pressure trends. Sleep apnea on BiPAP at bedtime. Hyperlipidemia takes statins. History of breast cancer status post recent lumpectomy. Leukocytosis likely reactive no signs of infection.  Continue to monitor.  Since patient has small bowel obstruction with incarcerated hernia will need close monitoring further workup and more than 2 midnight stay.   DVT prophylaxis: SCDs. Code Status: Full code. Family Communication: Family at the bedside. Disposition Plan: Medical floor. Consults called: General Surgery. Admission status:  Inpatient.

## 2023-11-19 NOTE — Plan of Care (Signed)
  Problem: Health Behavior/Discharge Planning: Goal: Ability to manage health-related needs will improve Outcome: Progressing   Problem: Clinical Measurements: Goal: Cardiovascular complication will be avoided Outcome: Progressing   Problem: Activity: Goal: Risk for activity intolerance will decrease Outcome: Progressing   

## 2023-11-19 NOTE — ED Notes (Signed)
 Called floor to make aware of PT arrival

## 2023-11-19 NOTE — ED Notes (Signed)
 Called and placed PT on monitor with CCMD

## 2023-11-19 NOTE — Hospital Course (Signed)
 PMH of breast cancer SP lumpectomy, OSA on BiPAP, HTN, HLD presented to the hospital with complaints of abdominal pain. Has chronic abdominal hernia now with small bowel obstruction. General surgery consulted. Patient opting for conservative measures for now.  Assessment and Plan: Small bowel obstruction with incarcerated ventral hernia. Appreciate general surgery consultation. Treated with conservative measures. NG tube was inserted.  Now NG is removed on 8/21.  On CLD.  Diet advancement per surgery. Gentle IV hydration. Surgery recommended exploratory laparotomy with LOA and hernia repair without mesh although she would prefer to hold off on the surgery. She tells me that she is planning to get her surgery for the hernia repair after she loses some weight for successful mesh placement. She is planning to get Zepbound  for weight loss. Recommend her to discuss with general surgery with regards to that medication given significant GI side effects.  Breast cancer. Status post lumpectomy. Monitor for now.  Obstructive sleep apnea. On BiPAP at home. Currently on hold secondary to NG tube.  HTN. On lisinopril  HCTZ. Currently on hold for now.  Obesity Class 3 Body mass index is 56.69 kg/m.  Placing the pt at higher risk of poor outcomes.  Lactic acidosis and leukocytosis. In the setting of obstruction. Currently improving.  Headache. Patient reports some frontal headache. Will initiate Toradol .  HLD. On Crestor .  Currently on hold.  Chronic diverticulosis. There is some reported fat stranding seen on the sigmoid diverticula. If the patient continues to have abdominal pain or worsening leukocytosis or fever we will have low threshold to initiate antibiotic for concern for diverticulitis.  Left adrenal adenoma. Increasing in size compared to prior CT scan from 1.8 cm to 2.2 cm. Recommendation is for adrenal washout CT scan or chemical shift MRI.  Would recommend this  outpatient once she recovers from the current presentation.  Constipation. Will initiate MiraLAX  tomorrow.

## 2023-11-20 DIAGNOSIS — K56609 Unspecified intestinal obstruction, unspecified as to partial versus complete obstruction: Secondary | ICD-10-CM | POA: Diagnosis not present

## 2023-11-20 LAB — BASIC METABOLIC PANEL WITH GFR
Anion gap: 14 (ref 5–15)
BUN: 15 mg/dL (ref 6–20)
CO2: 27 mmol/L (ref 22–32)
Calcium: 9 mg/dL (ref 8.9–10.3)
Chloride: 102 mmol/L (ref 98–111)
Creatinine, Ser: 0.63 mg/dL (ref 0.44–1.00)
GFR, Estimated: >60 mL/min (ref >60)
Glucose, Bld: 129 mg/dL — ABNORMAL HIGH (ref 70–99)
Potassium: 3.9 mmol/L (ref 3.5–5.1)
Sodium: 143 mmol/L (ref 135–145)

## 2023-11-20 LAB — CBC
HCT: 40.4 % (ref 36.0–46.0)
Hemoglobin: 12.9 g/dL (ref 12.0–15.0)
MCH: 28 pg (ref 26.0–34.0)
MCHC: 31.9 g/dL (ref 30.0–36.0)
MCV: 87.8 fL (ref 80.0–100.0)
Platelets: 298 K/uL (ref 150–400)
RBC: 4.6 MIL/uL (ref 3.87–5.11)
RDW: 14.2 % (ref 11.5–15.5)
WBC: 9 K/uL (ref 4.0–10.5)
nRBC: 0 % (ref 0.0–0.2)

## 2023-11-20 LAB — GLUCOSE, CAPILLARY
Glucose-Capillary: 103 mg/dL — ABNORMAL HIGH (ref 70–99)
Glucose-Capillary: 105 mg/dL — ABNORMAL HIGH (ref 70–99)
Glucose-Capillary: 113 mg/dL — ABNORMAL HIGH (ref 70–99)
Glucose-Capillary: 115 mg/dL — ABNORMAL HIGH (ref 70–99)
Glucose-Capillary: 116 mg/dL — ABNORMAL HIGH (ref 70–99)

## 2023-11-20 LAB — MAGNESIUM: Magnesium: 2.1 mg/dL (ref 1.7–2.4)

## 2023-11-20 MED ORDER — LACTATED RINGERS IV SOLN: INTRAVENOUS | Status: DC

## 2023-11-20 MED ORDER — DOCUSATE SODIUM 100 MG PO CAPS
100.0000 mg | ORAL_CAPSULE | Freq: Every day | ORAL | Status: DC | PRN
  Administered 2023-11-21: 100 mg via ORAL
  Filled 2023-11-20 (×2): qty 1

## 2023-11-20 NOTE — Progress Notes (Signed)
 1 Day Post-Op  Subjective: AXR shows no SB dilation. Lactate normalized. She is passing flatus but no BM yet.   ROS: See above, otherwise other systems negative  Objective: Vital signs in last 24 hours: Temp:  [97 F (36.1 C)-98.6 F (37 C)] 98.6 F (37 C) (08/21 0520) Pulse Rate:  [59-105] 59 (08/21 0520) Resp:  [16-18] 16 (08/21 0520) BP: (99-134)/(61-82) 134/82 (08/21 0520) SpO2:  [90 %-97 %] 96 % (08/21 0520) Last BM Date : 11/18/23  Intake/Output from previous day: 08/20 0701 - 08/21 0700 In: 887 [I.V.:767; NG/GT:120] Out: 550 [Emesis/NG output:550] Intake/Output this shift: No intake/output data recorded.  PE: Gen: female, NAD Abd: soft, non-distended, large ventral hernia that is not easily reducible but is freely mobile, non-TTP, no rebound/guarding, no peritoneal signs  Lab Results:  Recent Labs    11/19/23 0526 11/20/23 0538  WBC 11.6* 9.0  HGB 13.3 12.9  HCT 41.2 40.4  PLT 361 298   BMET Recent Labs    11/19/23 0526 11/20/23 0538  NA 137 143  K 4.5 3.9  CL 100 102  CO2 22 27  GLUCOSE 151* 129*  BUN 14 15  CREATININE 0.74 0.63  CALCIUM  9.2 9.0   PT/INR No results for input(s): LABPROT, INR in the last 72 hours. CMP     Component Value Date/Time   NA 143 11/20/2023 0538   NA 140 08/06/2022 1224   K 3.9 11/20/2023 0538   CL 102 11/20/2023 0538   CO2 27 11/20/2023 0538   GLUCOSE 129 (H) 11/20/2023 0538   BUN 15 11/20/2023 0538   BUN 13 08/06/2022 1224   CREATININE 0.63 11/20/2023 0538   CREATININE 0.49 09/03/2023 1142   CALCIUM  9.0 11/20/2023 0538   PROT 6.7 11/19/2023 0526   PROT 7.4 08/06/2022 1224   ALBUMIN  3.3 (L) 11/19/2023 0526   ALBUMIN  4.6 08/06/2022 1224   AST 20 11/19/2023 0526   AST 12 (L) 09/03/2023 1142   ALT 17 11/19/2023 0526   ALT 11 09/03/2023 1142   ALKPHOS 72 11/19/2023 0526   BILITOT 0.2 11/19/2023 0526   BILITOT 0.3 09/03/2023 1142   GFRNONAA >60 11/20/2023 0538   GFRNONAA >60 09/03/2023 1142    GFRAA >60 01/18/2019 1014   Lipase     Component Value Date/Time   LIPASE 27 11/18/2023 2002    Studies/Results: DG Abd Portable 1V-Small Bowel Obstruction Protocol-initial, 8 hr delay Result Date: 11/19/2023 CLINICAL DATA:  Small bowel obstruction. EXAM: PORTABLE ABDOMEN - 1 VIEW COMPARISON:  Radiograph dated 11/19/2023. FINDINGS: Enteric tube with tip and side-port in the proximal stomach. No bowel dilatation. Air is noted in the colon. No free air or radiopaque calculi. No acute osseous pathology. IMPRESSION: Enteric tube with tip and side-port in the proximal stomach. Electronically Signed   By: Vanetta Chou M.D.   On: 11/19/2023 20:25   DG Abd Portable 1 View Result Date: 11/19/2023 CLINICAL DATA:  Admitted for small bowel obstruction, post NGT insertion. Check insertion. EXAM: PORTABLE ABDOMEN - 1 VIEW COMPARISON:  CT abdomen and pelvis yesterday at 10:47 p.m. FINDINGS: 12:42 a.m. NGT interval insertion with tip in the body of the stomach, adequately inserted. There is moderate gas distension of the stomach. Current exam includes only the upper abdomen with no dilated upper abdominal small bowel being seen. There is contrast in the renal collecting systems. No free air is seen in the upper abdomen. Lung bases are clear. Mild cardiomegaly. Left chest wall surgical clips. IMPRESSION: 1.  NGT tip in the body of the stomach, adequately inserted. 2. Moderate gas distension of the stomach. Electronically Signed   By: Francis Quam M.D.   On: 11/19/2023 01:04   CT ABDOMEN PELVIS W CONTRAST Result Date: 11/18/2023 CLINICAL DATA:  Bowel obstruction suspected LLQ abdominal pain suspected incarcerated hernia EXAM: CT ABDOMEN AND PELVIS WITH CONTRAST TECHNIQUE: Multidetector CT imaging of the abdomen and pelvis was performed using the standard protocol following bolus administration of intravenous contrast. RADIATION DOSE REDUCTION: This exam was performed according to the departmental  dose-optimization program which includes automated exposure control, adjustment of the mA and/or kV according to patient size and/or use of iterative reconstruction technique. CONTRAST:  OMNIPAQUE  IOHEXOL  350 MG/ML SOLN COMPARISON:  CT virtual colonoscopy 09/06/2021 trauma CT abdomen pelvis 06/14/2020. FINDINGS: Lower chest: No acute abnormality. Hepatobiliary: No focal liver abnormality. No gallstones, gallbladder wall thickening, or pericholecystic fluid. No biliary dilatation. Pancreas: No focal lesion. Normal pancreatic contour. No surrounding inflammatory changes. No main pancreatic ductal dilatation. Spleen: Normal in size without focal abnormality. Adrenals/Urinary Tract: Interval increase in size of a 2.2 cm (from 1.8 cm) left adrenal gland nodule with a density of 57 Hounsfield units. Bilateral kidneys enhance symmetrically. No hydronephrosis. No hydroureter. The urinary bladder is unremarkable. Stomach/Bowel: Stomach is within normal limits. No evidence of large bowel wall thickening or dilatation. Dilatation of a short segment of small bowel within the left portion of the abdominal hernia (2.3 cm in caliber) with fecalized material within the lumen. No pneumatosis. Colonic diverticulosis. Vague fat stranding along a mid sigmoid diverticula (7:82). Appendix appears normal. Vascular/Lymphatic: No abdominal aorta or iliac aneurysm. Mild to moderate atherosclerotic plaque of the aorta and its branches. No abdominal, pelvic, or inguinal lymphadenopathy. Reproductive: Uterus and bilateral adnexa are unremarkable. Other: Trace free fluid distal to the obstructed loop of small bowel. No intraperitoneal free gas. No organized fluid collection. Musculoskeletal: Large lobulated infraumbilical ventral hernia containing the transverse colon, ascending colon, appendix, and medullary of the small bowel. No suspicious lytic or blastic osseous lesions. No acute displaced fracture. Multilevel degenerative changes of  the spine. IMPRESSION: 1. Short segment small bowel obstruction in the setting of a large lobulated infraumbilical ventral hernia. Dilatation of a short segment of small bowel within the left portion of the abdominal hernia (2.3 cm in caliber) with fecalized material within the lumen. Associated trace free fluid within the hernia along the obstructed small bowel. 2. Colonic diverticulosis with vague fat stranding along a focal mid sigmoid diverticula. No associated bowel wall thickening. Findings could represent developing diverticulitis. 3. Interval increase in size of a 2.2 cm (from 1.8 cm) left adrenal gland nodule. Probable adenoma. Recommend adrenal washout CT or chemical shift MRI. JACR 2017 Aug; 14(8):1038-44, JCAT 2016 Mar-Apr; 40(2):194-200, Urol J 2006 Spring; 3(2):71-4. 4.  Aortic Atherosclerosis (ICD10-I70.0). Electronically Signed   By: Morgane  Naveau M.D.   On: 11/18/2023 23:10    Anti-infectives: Anti-infectives (From admission, onward)    None       Assessment/Plan  58 y/o F w/ a large recurrent ventral hernia w/ SBO within the hernia contents  - She remains high risk for recurrence of her SBO, but her AXR shows no dilation and she is passing flatus - NGT removed - CLD - AROBF - Monitor abdominal exam    LOS: 1 day   Cordella DELENA Polly Marlis Cheron Surgery 11/20/2023, 7:40 AM Please see Amion for pager number during day hours 7:00am-4:30pm or 7:00am -11:30am on weekends

## 2023-11-20 NOTE — Progress Notes (Signed)
 Pt has tolerated her diet today, no pain, nausea or emesis. She has also ambulated in the hall with family. No bowel movement at this time.

## 2023-11-20 NOTE — Plan of Care (Signed)

## 2023-11-20 NOTE — Progress Notes (Signed)
 Pt was admitted to unit via stretcher from ED.  Pt was able to ambulate safely to the bathroom and then to the bed.  NGT was clamped and pt stated that she was having some abd pain. Proper placement of GT was verified with measurements charted based on external tube markings. LR infusing at 125mL/hr  PRN pain meds were given after an assessment, see eMAR.  Tele in place. Pt was alert and oriented. NGT was later hooked up to suction after intermittent low wall suction order was placed. Pt denies nausea.

## 2023-11-20 NOTE — Progress Notes (Signed)
 [facility] Progress Note Patient: Chelsea Soto FMW:[id number] DOB: [birth year] DOA: 11/18/2023  DOS: the patient was seen and examined on 11/20/2023  Brief Hospital Course: PMH of breast cancer SP lumpectomy, OSA on BiPAP, HTN, HLD presented to the hospital with complaints of abdominal pain. Has chronic abdominal hernia now with small bowel obstruction. General surgery consulted. Patient opting for conservative measures for now.  Assessment and Plan: Small bowel obstruction with incarcerated ventral hernia. Appreciate general surgery consultation. Treated with conservative measures. NG tube was inserted.  Now NG is removed on 8/21.  On CLD.  Diet advancement per surgery. Gentle IV hydration. Surgery recommended exploratory laparotomy with LOA and hernia repair without mesh although she would prefer to hold off on the surgery. She tells me that she is planning to get her surgery for the hernia repair after she loses some weight for successful mesh placement. She is planning to get Zepbound  for weight loss. Recommend her to discuss with general surgery with regards to that medication given significant GI side effects.  Breast cancer. Status post lumpectomy. Monitor for now.  Obstructive sleep apnea. On BiPAP at home. Currently on hold secondary to NG tube.  HTN. On lisinopril  HCTZ. Currently on hold for now.  Obesity Class 3 Body mass index is 56.69 kg/m.  Placing the pt at higher risk of poor outcomes.  Lactic acidosis and leukocytosis. In the setting of obstruction. Currently improving.  Headache. Patient reports some frontal headache. Will initiate Toradol .  HLD. On Crestor .  Currently on hold.  Chronic diverticulosis. There is some reported fat stranding seen on the sigmoid diverticula. If the patient continues to have abdominal pain or worsening leukocytosis or fever we will have low threshold to initiate antibiotic for concern for diverticulitis.  Left  adrenal adenoma. Increasing in size compared to prior CT scan from 1.8 cm to 2.2 cm. Recommendation is for adrenal washout CT scan or chemical shift MRI.  Would recommend this outpatient once she recovers from the current presentation.  Constipation. Will initiate MiraLAX  tomorrow.   Subjective: No nausea no vomiting no fever no chills.  Passing gas.  No BM.  Physical Exam: Clear to auscultation. S1-S2 present Bowel sounds present.  Diffuse tenderness. No edema.  Data Reviewed: I have Reviewed nursing notes, Vitals, and Lab results. Since last encounter, pertinent lab results CBC and BMP   . I have ordered test including CBC and BMP  .   Disposition: Status is: Inpatient Remains inpatient appropriate because: Monitor for improvement in abdominal pain and bowel obstruction  SCDs Start: 11/19/23 0405   Family Communication: Husband at bedside Level of care: Telemetry Medical   Vitals:   11/20/23 0009 11/20/23 0520 11/20/23 0806 11/20/23 1606  BP: 132/78 134/82 129/73 (!) 114/57  Pulse: (!) 105 (!) 59 69 70  Resp: 16 16 18 15   Temp: (!) 97 F (36.1 C) 98.6 F (37 C) 98.6 F (37 C) 98.5 F (36.9 C)  TempSrc:    Oral  SpO2: 97% 96% 94% 96%  Weight:      Height:         Author: Yetta Blanch, MD 11/20/2023 7:19 PM  Please look on [email or web address] to find out who is on call.

## 2023-11-20 NOTE — Plan of Care (Signed)
  Problem: Clinical Measurements: Goal: Diagnostic test results will improve Outcome: Progressing   Problem: Activity: Goal: Risk for activity intolerance will decrease Outcome: Progressing   Problem: Nutrition: Goal: Adequate nutrition will be maintained Outcome: Progressing   Problem: Coping: Goal: Level of anxiety will decrease Outcome: Progressing   Problem: Pain Managment: Goal: General experience of comfort will improve and/or be controlled Outcome: Progressing   Problem: Skin Integrity: Goal: Risk for impaired skin integrity will decrease Outcome: Progressing

## 2023-11-21 ENCOUNTER — Other Ambulatory Visit (HOSPITAL_BASED_OUTPATIENT_CLINIC_OR_DEPARTMENT_OTHER): Payer: Self-pay

## 2023-11-21 DIAGNOSIS — K56609 Unspecified intestinal obstruction, unspecified as to partial versus complete obstruction: Secondary | ICD-10-CM | POA: Diagnosis not present

## 2023-11-21 LAB — BASIC METABOLIC PANEL WITH GFR
Anion gap: 15 (ref 5–15)
BUN: 13 mg/dL (ref 6–20)
CO2: 27 mmol/L (ref 22–32)
Calcium: 9.1 mg/dL (ref 8.9–10.3)
Chloride: 107 mmol/L (ref 98–111)
Creatinine, Ser: 0.57 mg/dL (ref 0.44–1.00)
GFR, Estimated: >60 mL/min (ref >60)
Glucose, Bld: 119 mg/dL — ABNORMAL HIGH (ref 70–99)
Potassium: 3.8 mmol/L (ref 3.5–5.1)
Sodium: 149 mmol/L — ABNORMAL HIGH (ref 135–145)

## 2023-11-21 LAB — CBC
HCT: 36.1 % (ref 36.0–46.0)
Hemoglobin: 11.8 g/dL — ABNORMAL LOW (ref 12.0–15.0)
MCH: 28.2 pg (ref 26.0–34.0)
MCHC: 32.7 g/dL (ref 30.0–36.0)
MCV: 86.2 fL (ref 80.0–100.0)
Platelets: 280 K/uL (ref 150–400)
RBC: 4.19 MIL/uL (ref 3.87–5.11)
RDW: 14.1 % (ref 11.5–15.5)
WBC: 7.3 K/uL (ref 4.0–10.5)
nRBC: 0 % (ref 0.0–0.2)

## 2023-11-21 LAB — GLUCOSE, CAPILLARY
Glucose-Capillary: 104 mg/dL — ABNORMAL HIGH (ref 70–99)
Glucose-Capillary: 106 mg/dL — ABNORMAL HIGH (ref 70–99)
Glucose-Capillary: 129 mg/dL — ABNORMAL HIGH (ref 70–99)

## 2023-11-21 LAB — MAGNESIUM: Magnesium: 2 mg/dL (ref 1.7–2.4)

## 2023-11-21 MED ORDER — LISINOPRIL 20 MG PO TABS
20.0000 mg | ORAL_TABLET | Freq: Every day | ORAL | 0 refills | Status: DC
Start: 2023-11-21 — End: 2023-11-26
  Filled 2023-11-21: qty 30, 30d supply, fill #0

## 2023-11-21 MED ORDER — DOCUSATE SODIUM 100 MG PO CAPS
100.0000 mg | ORAL_CAPSULE | Freq: Two times a day (BID) | ORAL | 0 refills | Status: AC
  Filled 2023-11-21: qty 10, 5d supply, fill #0

## 2023-11-21 NOTE — Plan of Care (Signed)
   Problem: Coping: Goal: Level of anxiety will decrease Outcome: Progressing   Problem: Elimination: Goal: Will not experience complications related to bowel motility Outcome: Progressing

## 2023-11-21 NOTE — Progress Notes (Addendum)
 Subjective: CC Tolerating regular diet. She reports no abdominal pain, n/v. Passing flatus. BM this am. She feels her hernia is back to baseline.   Objective: Vital signs in last 24 hours: Temp:  [97.5 F (36.4 C)-98.6 F (37 C)] 97.7 F (36.5 C) (08/22 0727) Pulse Rate:  [61-70] 61 (08/22 0727) Resp:  [15-20] 16 (08/22 0727) BP: (114-145)/(57-90) 145/90 (08/22 0727) SpO2:  [94 %-98 %] 98 % (08/22 0727) Last BM Date : 11/18/23  Intake/Output from previous day: 08/21 0701 - 08/22 0700 In: 480 [P.O.:480] Out: -  Intake/Output this shift: No intake/output data recorded.  PE: Gen: female, NAD Abd: soft, non-distended, large ventral hernia that is not easily reducible but is freely mobile, non-TTP, no rebound/guarding, no overlying skin changes.   Lab Results:  Recent Labs    11/20/23 0538 11/21/23 0251  WBC 9.0 7.3  HGB 12.9 11.8*  HCT 40.4 36.1  PLT 298 280   BMET Recent Labs    11/20/23 0538 11/21/23 0251  NA 143 149*  K 3.9 3.8  CL 102 107  CO2 27 27  GLUCOSE 129* 119*  BUN 15 13  CREATININE 0.63 0.57  CALCIUM  9.0 9.1   PT/INR No results for input(s): LABPROT, INR in the last 72 hours. CMP     Component Value Date/Time   NA 149 (H) 11/21/2023 0251   NA 140 08/06/2022 1224   K 3.8 11/21/2023 0251   CL 107 11/21/2023 0251   CO2 27 11/21/2023 0251   GLUCOSE 119 (H) 11/21/2023 0251   BUN 13 11/21/2023 0251   BUN 13 08/06/2022 1224   CREATININE 0.57 11/21/2023 0251   CREATININE 0.49 09/03/2023 1142   CALCIUM  9.1 11/21/2023 0251   PROT 6.7 11/19/2023 0526   PROT 7.4 08/06/2022 1224   ALBUMIN  3.3 (L) 11/19/2023 0526   ALBUMIN  4.6 08/06/2022 1224   AST 20 11/19/2023 0526   AST 12 (L) 09/03/2023 1142   ALT 17 11/19/2023 0526   ALT 11 09/03/2023 1142   ALKPHOS 72 11/19/2023 0526   BILITOT 0.2 11/19/2023 0526   BILITOT 0.3 09/03/2023 1142   GFRNONAA >60 11/21/2023 0251   GFRNONAA >60 09/03/2023 1142   GFRAA >60 01/18/2019 1014    Lipase     Component Value Date/Time   LIPASE 27 11/18/2023 2002    Studies/Results: DG Abd Portable 1V-Small Bowel Obstruction Protocol-initial, 8 hr delay Result Date: 11/19/2023 CLINICAL DATA:  Small bowel obstruction. EXAM: PORTABLE ABDOMEN - 1 VIEW COMPARISON:  Radiograph dated 11/19/2023. FINDINGS: Enteric tube with tip and side-port in the proximal stomach. No bowel dilatation. Air is noted in the colon. No free air or radiopaque calculi. No acute osseous pathology. IMPRESSION: Enteric tube with tip and side-port in the proximal stomach. Electronically Signed   By: Vanetta Chou M.D.   On: 11/19/2023 20:25    Anti-infectives: Anti-infectives (From admission, onward)    None       Assessment/Plan  58 y/o F w/ a large recurrent ventral hernia w/ SBO within the hernia contents - Dr. Polly discussed OR for exploration with planned LOA and possible hernia repair without mesh if feasible earlier in admission as she remains high risk for recurrence of her SBO. She declined surgery as she is about to start breast radiation in 2 weeks and did not want to delay this.  - SBO appears to have resolved. She has air in her colon on xray, HDS (afebrile, no tachycardia or hypotension), WBC  wnl, she is tolerating a diet, having bowel function, reports her hernia is back to baseline and on exam she has a freely mobile large ventral hernia that is NT and without overlying skin changes.  - Okay for discharge from our standpoint. She can follow up with our office to discuss elective repair. Will send a message to [facility].   FEN: Reg VTE: SCDs, Lovenox  ID: None   LOS: 2 days   Ozell HERO [facility] 11/21/2023, 8:03 AM Please see Amion for pager number during day hours 7:00am-4:30pm or 7:00am -11:30am on weekends

## 2023-11-21 NOTE — Progress Notes (Signed)
 Pt stable, vitals stable, iv d//c, skin intact, review discharge instructions w/ pt. Pt verbalized understanding. Wheelchair via volunteer to main entrance waiting for husband to arrive.

## 2023-11-23 NOTE — Discharge Summary (Signed)
 Physician Discharge Summary   Patient: Chelsea Soto MRN: [id number] DOB: 05-22-1965  Admit date:     11/18/2023  Discharge date: 11/21/2023  Discharge Physician: Yetta Blanch  PCP: Chrystal Lamarr RAMAN, MD  Recommendations at discharge: Follow-up with PCP in 1 week. Follow-up with surgery as recommended.   Follow-up Information     Polly Cordella LABOR, MD Follow up.   Specialty: General Surgery Why: Call to confirm your appointment date and time, bring a copy of your photo ID and insurance card, arrive 30 minutes prior to your appointment Contact information: [address] [place] [address] [phone]         Chrystal Lamarr RAMAN, MD. Schedule an appointment as soon as possible for a visit in 1 week(s).   Specialty: Family Medicine Why: with BMP lab to look at kidney/electrolyte numbers Contact information: [address] [place] [address] [phone]                Discharge Diagnoses: Principal Problem:   SBO (small bowel obstruction) (HCC) Active Problems:   Incarcerated hernia   Mixed hyperlipidemia   Primary hypertension   OSA on CPAP   Malignant neoplasm of upper-inner quadrant of left breast in female, estrogen receptor positive (HCC)   Small bowel obstruction [facility])  Hospital Course: PMH of breast cancer SP lumpectomy, OSA on BiPAP, HTN, HLD presented to the hospital with complaints of abdominal pain. Has chronic abdominal hernia now with small bowel obstruction. General surgery consulted. Patient opted for conservative measures for now.  Assessment and Plan: Small bowel obstruction with incarcerated ventral hernia. Appreciate general surgery consultation. Treated with conservative measures. NG tube was inserted.  NG is removed on 8/21.  On CLD.  Diet advancement per surgery. Gentle IV hydration. Surgery recommended exploratory laparotomy with LOA and hernia repair without mesh although she would  prefer to hold off on the surgery. She tells me that she is planning to get her surgery for the hernia repair after she loses some weight for successful mesh placement. She is planning to get Zepbound  for weight loss. Outpatient follow-up with GI and general surgery recommended.  Breast cancer. Status post lumpectomy. Monitor for now.  Outpatient follow-up recommended.  Obstructive sleep apnea. On BiPAP at home.  HTN. Blood pressure stable without any medication Currently recommending to hold on lisinopril  HCTZ.  Obesity Class 3 Body mass index is 56.69 kg/m.  Placing the pt at higher risk of poor outcomes.  Lactic acidosis and leukocytosis. In the setting of obstruction. Currently improving.  Headache. Patient reports some frontal headache. Resolved with Toradol .  HLD. On Crestor .  Chronic diverticulosis. There is some reported fat stranding seen on the sigmoid diverticula. If the patient continues to have abdominal pain or worsening leukocytosis or fever we will have low threshold to initiate antibiotic for concern for diverticulitis.  Left adrenal adenoma. Increasing in size compared to prior CT scan from 1.8 cm to 2.2 cm. Recommendation is for adrenal washout CT scan or chemical shift MRI.  Would recommend this outpatient once she recovers from the current presentation.  Constipation. Continue bowel regimen  Consultants:  General surgery  Procedures performed:  NG  DISCHARGE MEDICATION: Allergies as of 11/21/2023   No Known Allergies      Medication List     STOP taking these medications    dicyclomine  10 MG capsule Commonly known as: BENTYL    lisinopril -hydrochlorothiazide  20-25 MG tablet Commonly known as: ZESTORETIC   TAKE these medications    docusate sodium  100 MG capsule Commonly known as: COLACE Take 1 capsule (100 mg total) by mouth 2 (two) times daily.   lisinopril  20 MG tablet Commonly known as: ZESTRIL  Take 1 tablet (20 mg  total) by mouth daily.   multivitamin with minerals Tabs tablet Take 1 tablet by mouth daily.   rosuvastatin  10 MG tablet Commonly known as: CRESTOR  Take 1 tablet (10 mg total) by mouth daily.       Disposition: Home Diet recommendation: Cardiac diet  Discharge Exam: Vitals:   11/20/23 2200 11/21/23 0126 11/21/23 0520 11/21/23 0727  BP:   (!) 143/87 (!) 145/90  Pulse:   67 61  Resp: 20 18 18 16   Temp:   (!) 97.5 F (36.4 C) 97.7 F (36.5 C)  TempSrc:   Oral Oral  SpO2:   97% 98%  Weight:      Height:       Filed Weights   11/18/23 2000  Weight: (!) 145.2 kg   Condition at discharge: stable  The results of significant diagnostics from this hospitalization (including imaging, microbiology, ancillary and laboratory) are listed below for reference.   Imaging Studies: DG Abd Portable 1V-Small Bowel Obstruction Protocol-initial, 8 hr delay Result Date: 11/19/2023 CLINICAL DATA:  Small bowel obstruction. EXAM: PORTABLE ABDOMEN - 1 VIEW COMPARISON:  Radiograph dated 11/19/2023. FINDINGS: Enteric tube with tip and side-port in the proximal stomach. No bowel dilatation. Air is noted in the colon. No free air or radiopaque calculi. No acute osseous pathology. IMPRESSION: Enteric tube with tip and side-port in the proximal stomach. Electronically Signed   By: Vanetta Chou M.D.   On: 11/19/2023 20:25   DG Abd Portable 1 View Result Date: 11/19/2023 CLINICAL DATA:  Admitted for small bowel obstruction, post NGT insertion. Check insertion. EXAM: PORTABLE ABDOMEN - 1 VIEW COMPARISON:  CT abdomen and pelvis yesterday at 10:47 p.m. FINDINGS: 12:42 a.m. NGT interval insertion with tip in the body of the stomach, adequately inserted. There is moderate gas distension of the stomach. Current exam includes only the upper abdomen with no dilated upper abdominal small bowel being seen. There is contrast in the renal collecting systems. No free air is seen in the upper abdomen. Lung bases are  clear. Mild cardiomegaly. Left chest wall surgical clips. IMPRESSION: 1. NGT tip in the body of the stomach, adequately inserted. 2. Moderate gas distension of the stomach. Electronically Signed   By: Francis Quam M.D.   On: 11/19/2023 01:04   CT ABDOMEN PELVIS W CONTRAST Result Date: 11/18/2023 CLINICAL DATA:  Bowel obstruction suspected LLQ abdominal pain suspected incarcerated hernia EXAM: CT ABDOMEN AND PELVIS WITH CONTRAST TECHNIQUE: Multidetector CT imaging of the abdomen and pelvis was performed using the standard protocol following bolus administration of intravenous contrast. RADIATION DOSE REDUCTION: This exam was performed according to the departmental dose-optimization program which includes automated exposure control, adjustment of the mA and/or kV according to patient size and/or use of iterative reconstruction technique. CONTRAST:  OMNIPAQUE  IOHEXOL  350 MG/ML SOLN COMPARISON:  CT virtual colonoscopy 09/06/2021 trauma CT abdomen pelvis 06/14/2020. FINDINGS: Lower chest: No acute abnormality. Hepatobiliary: No focal liver abnormality. No gallstones, gallbladder wall thickening, or pericholecystic fluid. No biliary dilatation. Pancreas: No focal lesion. Normal pancreatic contour. No surrounding inflammatory changes. No main pancreatic ductal dilatation. Spleen: Normal in size without focal abnormality. Adrenals/Urinary Tract: Interval increase in size of a 2.2 cm (from 1.8 cm) left adrenal gland nodule with a density of  57 Hounsfield units. Bilateral kidneys enhance symmetrically. No hydronephrosis. No hydroureter. The urinary bladder is unremarkable. Stomach/Bowel: Stomach is within normal limits. No evidence of large bowel wall thickening or dilatation. Dilatation of a short segment of small bowel within the left portion of the abdominal hernia (2.3 cm in caliber) with fecalized material within the lumen. No pneumatosis. Colonic diverticulosis. Vague fat stranding along a mid sigmoid  diverticula (7:82). Appendix appears normal. Vascular/Lymphatic: No abdominal aorta or iliac aneurysm. Mild to moderate atherosclerotic plaque of the aorta and its branches. No abdominal, pelvic, or inguinal lymphadenopathy. Reproductive: Uterus and bilateral adnexa are unremarkable. Other: Trace free fluid distal to the obstructed loop of small bowel. No intraperitoneal free gas. No organized fluid collection. Musculoskeletal: Large lobulated infraumbilical ventral hernia containing the transverse colon, ascending colon, appendix, and medullary of the small bowel. No suspicious lytic or blastic osseous lesions. No acute displaced fracture. Multilevel degenerative changes of the spine. IMPRESSION: 1. Short segment small bowel obstruction in the setting of a large lobulated infraumbilical ventral hernia. Dilatation of a short segment of small bowel within the left portion of the abdominal hernia (2.3 cm in caliber) with fecalized material within the lumen. Associated trace free fluid within the hernia along the obstructed small bowel. 2. Colonic diverticulosis with vague fat stranding along a focal mid sigmoid diverticula. No associated bowel wall thickening. Findings could represent developing diverticulitis. 3. Interval increase in size of a 2.2 cm (from 1.8 cm) left adrenal gland nodule. Probable adenoma. Recommend adrenal washout CT or chemical shift MRI. JACR 2017 Aug; 14(8):1038-44, JCAT 2016 Mar-Apr; 40(2):194-200, Urol J 2006 Spring; 3(2):71-4. 4.  Aortic Atherosclerosis (ICD10-I70.0). Electronically Signed   By: Morgane  Naveau M.D.   On: 11/18/2023 23:10   MM Breast Surgical Specimen Result Date: 10/30/2023 CLINICAL DATA:  Evaluate surgical specimen following lumpectomy for LEFT breast cancer. EXAM: SPECIMEN RADIOGRAPH OF THE LEFT BREAST COMPARISON:  Previous exam(s). FINDINGS: Status post excision of the LEFT breast. The radioactive seed and RIBBON biopsy clip are present and intact. IMPRESSION:  Specimen radiograph of the LEFT breast. Electronically Signed   By: Reyes Phi M.D.   On: 10/30/2023 11:27   US  LT RADIOACTIVE SEED LOC Result Date: 10/28/2023 CLINICAL DATA:  Patient presents for radioactive seed localization prior to surgical excision left breast. EXAM: ULTRASOUND GUIDED RADIOACTIVE SEED LOCALIZATION OF THE LEFT BREAST AND POST SEED PLACEMENT LEFT BREAST MAMMOGRAM COMPARISON:  Previous exam(s). FINDINGS: Patient presents for radioactive seed localization prior to surgical excision. I met with the patient and we discussed the procedure of seed localization including benefits and alternatives. We discussed the high likelihood of a successful procedure. We discussed the risks of the procedure including infection, bleeding, tissue injury and further surgery. We discussed the low dose of radioactivity involved in the procedure. Informed, written consent was given. The usual time-out protocol was performed immediately prior to the procedure. Using ultrasound guidance, sterile technique, 1% lidocaine  and an I-125 radioactive seed, targeted mass/ribbon clip was localized using a lateral to medial approach. The radioactive seed was placed directly into the targeted mass under direct sonographic observation. The follow-up mammogram images confirm the seed in the expected location and were marked for Dr. Vanderbilt. The radioactive seed lies directly within the targeted mass. Follow-up survey of the patient confirms presence of the radioactive seed. Order number of I-125 seed:  [id number]. Total activity:  0.243 millicurie reference Date: 09/10/2023 The patient tolerated the procedure well and was [facility]. She was given  instructions regarding seed removal. IMPRESSION: Radioactive seed localization left breast. No apparent complications. Electronically Signed   By: Toribio Agreste M.D.   On: 10/28/2023 10:09   MM CLIP PLACEMENT LEFT Result Date: 10/28/2023 CLINICAL DATA:  Patient  presents for radioactive seed localization prior to surgical excision left breast. EXAM: ULTRASOUND GUIDED RADIOACTIVE SEED LOCALIZATION OF THE LEFT BREAST AND POST SEED PLACEMENT LEFT BREAST MAMMOGRAM COMPARISON:  Previous exam(s). FINDINGS: Patient presents for radioactive seed localization prior to surgical excision. I met with the patient and we discussed the procedure of seed localization including benefits and alternatives. We discussed the high likelihood of a successful procedure. We discussed the risks of the procedure including infection, bleeding, tissue injury and further surgery. We discussed the low dose of radioactivity involved in the procedure. Informed, written consent was given. The usual time-out protocol was performed immediately prior to the procedure. Using ultrasound guidance, sterile technique, 1% lidocaine  and an I-125 radioactive seed, targeted mass/ribbon clip was localized using a lateral to medial approach. The radioactive seed was placed directly into the targeted mass under direct sonographic observation. The follow-up mammogram images confirm the seed in the expected location and were marked for Dr. Vanderbilt. The radioactive seed lies directly within the targeted mass. Follow-up survey of the patient confirms presence of the radioactive seed. Order number of I-125 seed:  [id number]. Total activity:  0.243 millicurie reference Date: 09/10/2023 The patient tolerated the procedure well and was released from the [facility]. She was given instructions regarding seed removal. IMPRESSION: Radioactive seed localization left breast. No apparent complications. Electronically Signed   By: Toribio Agreste M.D.   On: 10/28/2023 10:09    Microbiology: Results for orders placed or performed during the hospital encounter of 09/13/20  Resp Panel by RT-PCR (Flu A&B, Covid) Nasopharyngeal Swab     Status: None   Collection Time: 09/13/20  9:27 PM   Specimen: Nasopharyngeal Swab;  Nasopharyngeal(NP) swabs in vial transport medium  Result Value Ref Range Status   SARS Coronavirus 2 by RT PCR NEGATIVE NEGATIVE Final    Comment: (NOTE) SARS-CoV-2 target nucleic acids are NOT DETECTED.  The SARS-CoV-2 RNA is generally detectable in upper respiratory specimens during the acute phase of infection. The lowest concentration of SARS-CoV-2 viral copies this assay can detect is 138 copies/mL. A negative result does not preclude SARS-Cov-2 infection and should not be used as the sole basis for treatment or other patient management decisions. A negative result may occur with  improper specimen collection/handling, submission of specimen other than nasopharyngeal swab, presence of viral mutation(s) within the areas targeted by this assay, and inadequate number of viral copies(<138 copies/mL). A negative result must be combined with clinical observations, patient history, and epidemiological information. The expected result is Negative.  Fact Sheet for Patients:  BloggerCourse.com  Fact Sheet for Healthcare Providers:  SeriousBroker.it  This test is no t yet approved or cleared by the [place]  FDA and  has been authorized for detection and/or diagnosis of SARS-CoV-2 by FDA under an Emergency Use Authorization (EUA). This EUA will remain  in effect (meaning this test can be used) for the duration of the COVID-19 declaration under Section 564(b)(1) of the Act, 21 U.S.C.section 360bbb-3(b)(1), unless the authorization is terminated  or revoked sooner.       Influenza A by PCR NEGATIVE NEGATIVE Final   Influenza B by PCR NEGATIVE NEGATIVE Final    Comment: (NOTE) The Xpert Xpress SARS-CoV-2/FLU/RSV plus assay is intended as an aid in  the diagnosis of influenza from Nasopharyngeal swab specimens and should not be used as a sole basis for treatment. Nasal washings and aspirates are unacceptable for Xpert Xpress  SARS-CoV-2/FLU/RSV testing.  Fact Sheet for Patients: BloggerCourse.com  Fact Sheet for Healthcare Providers: SeriousBroker.it  This test is not yet approved or cleared by the [place]  FDA and has been authorized for detection and/or diagnosis of SARS-CoV-2 by FDA under an Emergency Use Authorization (EUA). This EUA will remain in effect (meaning this test can be used) for the duration of the COVID-19 declaration under Section 564(b)(1) of the Act, 21 U.S.C. section 360bbb-3(b)(1), unless the authorization is terminated or revoked.  Performed at [facility], [address], [place], [place] [address]    Labs: CBC: Recent Labs  Lab 11/18/23 2002 11/19/23 0526 11/20/23 0538 11/21/23 0251  WBC 14.2* 11.6* 9.0 7.3  NEUTROABS  --  9.5*  --   --   HGB 14.2 13.3 12.9 11.8*  HCT 43.6 41.2 40.4 36.1  MCV 84.3 86.9 87.8 86.2  PLT 455* 361 298 280   Basic Metabolic Panel: Recent Labs  Lab 11/18/23 2002 11/19/23 0526 11/20/23 0538 11/21/23 0251  NA 137 137 143 149*  K 3.9 4.5 3.9 3.8  CL 101 100 102 107  CO2 22 22 27 27   GLUCOSE 137* 151* 129* 119*  BUN 15 14 15 13   CREATININE 0.74 0.74 0.63 0.57  CALCIUM  9.5 9.2 9.0 9.1  MG  --   --  2.1 2.0   Liver Function Tests: Recent Labs  Lab 11/18/23 2002 11/19/23 0526  AST 21 20  ALT 17 17  ALKPHOS 86 72  BILITOT 0.4 0.2  PROT 7.4 6.7  ALBUMIN  3.6 3.3*   CBG: Recent Labs  Lab 11/20/23 1602 11/20/23 2052 11/21/23 0033 11/21/23 0518 11/21/23 0827  GLUCAP 115* 105* 106* 104* 129*    Discharge time spent: greater than 30 minutes.  Author: Yetta Blanch, MD  [facility] 11/21/2023

## 2023-11-25 ENCOUNTER — Encounter: Payer: Self-pay | Admitting: Physical Therapy

## 2023-11-25 NOTE — Progress Notes (Signed)
 Location of Breast Cancer: Left Breast  Histology per Pathology Report:   Receptor Status: ER(100% positive), PR (100% Positive), Her2-neu (Negative)1+, Ki-67(2%)  Did patient present with symptoms (if so, please note symptoms) or was this found on screening mammography?: Screening mammography  Past/Anticipated interventions by surgeon, if any:  Left BREAST LUMPECTOMY WITH RADIOACTIVE SEED AND SENTINEL LYMPH NODE BIOPSY with Vanderbilt Ned, MD   Past/Anticipated interventions by medical oncology, if any: Chemotherapy   Lymphedema issues, if any:  {:18581} {t:21944}   Pain issues, if any:  {:18581} {PAIN DESCRIPTION:21022940}  SAFETY ISSUES: Prior radiation? {:18581} Pacemaker/ICD? {:18581} Possible current pregnancy?{:18581} Is the patient on methotrexate? {:18581}  Current Complaints / other details:  ***

## 2023-11-26 ENCOUNTER — Other Ambulatory Visit (HOSPITAL_BASED_OUTPATIENT_CLINIC_OR_DEPARTMENT_OTHER): Payer: Self-pay

## 2023-11-26 MED ORDER — LISINOPRIL 20 MG PO TABS
20.0000 mg | ORAL_TABLET | Freq: Every day | ORAL | 3 refills | Status: DC
  Filled 2023-11-26: qty 90, 90d supply, fill #0
  Filled 2023-12-17: qty 30, 30d supply, fill #0
  Filled 2024-02-02: qty 30, 30d supply, fill #1

## 2023-11-27 ENCOUNTER — Telehealth: Payer: Self-pay | Admitting: Physical Therapy

## 2023-11-27 ENCOUNTER — Other Ambulatory Visit: Payer: Self-pay

## 2023-11-27 ENCOUNTER — Ambulatory Visit: Payer: Self-pay | Admitting: Physical Therapy

## 2023-11-27 ENCOUNTER — Other Ambulatory Visit (HOSPITAL_BASED_OUTPATIENT_CLINIC_OR_DEPARTMENT_OTHER): Payer: Self-pay

## 2023-11-27 ENCOUNTER — Encounter: Payer: Self-pay | Admitting: *Deleted

## 2023-11-27 NOTE — Telephone Encounter (Signed)
 Pt did not show for her appointment. Reached out to patient and she states she has been in the hospital with a bowel obstruction secondary to a hernia. She wishes to be discharged from PT at this time.   Chelsea Soto, [place] 11/27/23 4:43 PM

## 2023-11-28 ENCOUNTER — Encounter: Payer: Self-pay | Admitting: Radiology

## 2023-11-28 ENCOUNTER — Ambulatory Visit
Admission: RE | Admit: 2023-11-28 | Discharge: 2023-11-28 | Disposition: A | Discharge status: Home / Self Care | Source: Ambulatory Visit | Attending: Radiation Oncology | Admitting: Radiation Oncology

## 2023-11-28 ENCOUNTER — Ambulatory Visit
Admission: RE | Admit: 2023-11-28 | Discharge: 2023-11-28 | Disposition: A | Discharge status: Home / Self Care | Source: Ambulatory Visit | Attending: Radiology | Admitting: Radiology

## 2023-11-28 VITALS — BP 140/74 | HR 81 | Temp 97.9°F | Resp 24 | Ht 64.0 in | Wt 332.4 lb

## 2023-11-28 DIAGNOSIS — K5730 Diverticulosis of large intestine without perforation or abscess without bleeding: Secondary | ICD-10-CM | POA: Insufficient documentation

## 2023-11-28 DIAGNOSIS — K56699 Other intestinal obstruction unspecified as to partial versus complete obstruction: Secondary | ICD-10-CM | POA: Diagnosis not present

## 2023-11-28 DIAGNOSIS — E278 Other specified disorders of adrenal gland: Secondary | ICD-10-CM | POA: Insufficient documentation

## 2023-11-28 DIAGNOSIS — I700 Atherosclerosis of aorta: Secondary | ICD-10-CM | POA: Insufficient documentation

## 2023-11-28 DIAGNOSIS — C50212 Malignant neoplasm of upper-inner quadrant of left female breast: Secondary | ICD-10-CM | POA: Insufficient documentation

## 2023-11-28 DIAGNOSIS — Z1721 Progesterone receptor positive status: Secondary | ICD-10-CM | POA: Diagnosis not present

## 2023-11-28 DIAGNOSIS — Z170 Estrogen receptor positive status [ER+]: Secondary | ICD-10-CM | POA: Diagnosis not present

## 2023-11-28 DIAGNOSIS — M479 Spondylosis, unspecified: Secondary | ICD-10-CM | POA: Diagnosis not present

## 2023-11-28 DIAGNOSIS — Z1732 Human epidermal growth factor receptor 2 negative status: Secondary | ICD-10-CM | POA: Insufficient documentation

## 2023-11-28 DIAGNOSIS — K469 Unspecified abdominal hernia without obstruction or gangrene: Secondary | ICD-10-CM | POA: Insufficient documentation

## 2023-11-28 DIAGNOSIS — Z79899 Other long term (current) drug therapy: Secondary | ICD-10-CM | POA: Diagnosis not present

## 2023-11-28 DIAGNOSIS — I517 Cardiomegaly: Secondary | ICD-10-CM | POA: Insufficient documentation

## 2023-11-28 DIAGNOSIS — K439 Ventral hernia without obstruction or gangrene: Secondary | ICD-10-CM | POA: Insufficient documentation

## 2023-11-28 DIAGNOSIS — Z7982 Long term (current) use of aspirin: Secondary | ICD-10-CM | POA: Insufficient documentation

## 2023-11-28 NOTE — Progress Notes (Signed)
 Radiation Oncology         [phone] ________________________________  Name: Chelsea Soto MRN: [id number]  Date: 11/28/2023  DOB: 1965/10/21  Re-Evaluation Note  CC: Chrystal Lamarr RAMAN, MD  Odean Potts, MD    ICD-10-CM   1. Malignant neoplasm of upper-inner quadrant of left breast in female, estrogen receptor positive (HCC)  C50.212    Z17.0         Cancer Staging  Malignant neoplasm of upper-inner quadrant of left breast in female, estrogen receptor positive (HCC) Staging form: Breast, AJCC 8th Edition - Clinical: Stage IA (cT1b, cN0, cM0, G2, ER+, PR+, HER2-) - Signed by Odean Potts, MD on 09/03/2023 Stage prefix: Initial diagnosis Histologic grading system: 3 grade system - Pathologic: Stage IA (pT1b, pN0, cM0, G2, ER+, PR+, HER2-) - Signed by Wyatt Leeroy HERO, PA-C on 11/28/2023 Histologic grading system: 3 grade system    Diagnosis:   Stage IA (cT1b, N0, 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 She was seen in our multidisciplinary breast clinic on 09/03/2023.   Since consultation, she underwent genetic testing on 09/15/2023. 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 Currently this puts her at a 5-15% risk for ovarian cancer.  There is currently no well-established association between the BRIP1 gene and breast cancer  She opted to proceed with lumpectomy with nodal biopsy on 10/30/2023 under the care of Dr. Vanderbilt. Pathology from the procedure revealed: Intermediate grade invasive ductal carcinoma measuring 1.0 cm in greatest dimension.  All margins were negative with the closest margin being the anterior margin at 0.4 mm.  Prognostic indicators were ER and PR positive, HER2 negative, with a Ki-67 of 2%.  2/2 of the left axillary lymph nodes were negative.  OncotypeDx tests collected on 10/30/2023 demonstrated a  recurrence score of 6, which indicates a distant recurrence risk at 9 years to be 3%.  It also indicates a chemotherapy benefit of less than 1%.    She presented to the [facility] on 11/06/2023 with concerns of redness and swelling to the axillary incision for 1 day.   On 11/18/2023, the patient presented to the ED with abdominal pain.  She was subsequently found to have a small bowel obstruction with incarcerated ventral hernia.  Patient preferred to not proceed with the recommended hernia repair.  Outpatient follow-up with GI and general surgery was recommended, and she was subsequently discharged on 11/21/2023.  On review of systems, the patient reports to be doing well overall today. She denies breast pain, issues with range of motion, and any other symptoms.    Allergies:  has no known allergies.  Meds: Current Outpatient Medications  Medication Sig Dispense Refill   aspirin EC 81 MG tablet Take 81 mg by mouth daily.     cetirizine (ZYRTEC ALLERGY) 10 MG tablet 10 mg daily.     docusate sodium  (COLACE) 100 MG capsule Take 1 capsule (100 mg total) by mouth 2 (two) times daily. 10 capsule 0   lisinopril  (ZESTRIL ) 20 MG tablet Take 1 tablet (20 mg total) by mouth daily. 90 tablet 3   Multiple Vitamin (MULTIVITAMIN WITH MINERALS) TABS tablet Take 1 tablet by mouth daily.     rosuvastatin  (CRESTOR ) 10 MG tablet Take 1 tablet (10 mg total) by mouth daily. 90 tablet 0   No current facility-administered medications for this encounter.  Physical Findings: The patient is in no acute distress. Patient is alert and oriented.  height is 5' 4 (1.626 m) and weight is 332 lb 6.4 oz (150.8 kg) (abnormal). Her temperature is 97.9 F (36.6 C). Her blood pressure is 140/74 (abnormal) and her pulse is 81. Her respiration is 24 (abnormal) and oxygen saturation is 99%.  In general this is a well appearing female in no acute distress. She's alert and oriented x4 and appropriate throughout the  examination. Cardiopulmonary assessment is negative for acute distress and she exhibits normal effort.     Left breast: well healed lumpectomy incision. Small area of scabbing at the lateral portion of the incision. Skin edges are well approximated with no signs of delayed wound healing or infection. Minimal erythema surrounding the axilla incision, with no signs of infection.    Lab Findings: Lab Results  Component Value Date   WBC 7.3 11/21/2023   HGB 11.8 (L) 11/21/2023   HCT 36.1 11/21/2023   MCV 86.2 11/21/2023   PLT 280 11/21/2023    Radiographic Findings: DG Abd Portable 1V-Small Bowel Obstruction Protocol-initial, 8 hr delay Result Date: 11/19/2023 CLINICAL DATA:  Small bowel obstruction. EXAM: PORTABLE ABDOMEN - 1 VIEW COMPARISON:  Radiograph dated 11/19/2023. FINDINGS: Enteric tube with tip and side-port in the proximal stomach. No bowel dilatation. Air is noted in the colon. No free air or radiopaque calculi. No acute osseous pathology. IMPRESSION: Enteric tube with tip and side-port in the proximal stomach. Electronically Signed   By: Vanetta Chou M.D.   On: 11/19/2023 20:25   DG Abd Portable 1 View Result Date: 11/19/2023 CLINICAL DATA:  Admitted for small bowel obstruction, post NGT insertion. Check insertion. EXAM: PORTABLE ABDOMEN - 1 VIEW COMPARISON:  CT abdomen and pelvis yesterday at 10:47 p.m. FINDINGS: 12:42 a.m. NGT interval insertion with tip in the body of the stomach, adequately inserted. There is moderate gas distension of the stomach. Current exam includes only the upper abdomen with no dilated upper abdominal small bowel being seen. There is contrast in the renal collecting systems. No free air is seen in the upper abdomen. Lung bases are clear. Mild cardiomegaly. Left chest wall surgical clips. IMPRESSION: 1. NGT tip in the body of the stomach, adequately inserted. 2. Moderate gas distension of the stomach. Electronically Signed   By: Francis Quam M.D.   On:  11/19/2023 01:04   CT ABDOMEN PELVIS W CONTRAST Result Date: 11/18/2023 CLINICAL DATA:  Bowel obstruction suspected LLQ abdominal pain suspected incarcerated hernia EXAM: CT ABDOMEN AND PELVIS WITH CONTRAST TECHNIQUE: Multidetector CT imaging of the abdomen and pelvis was performed using the standard protocol following bolus administration of intravenous contrast. RADIATION DOSE REDUCTION: This exam was performed according to the departmental dose-optimization program which includes automated exposure control, adjustment of the mA and/or kV according to patient size and/or use of iterative reconstruction technique. CONTRAST:  OMNIPAQUE  IOHEXOL  350 MG/ML SOLN COMPARISON:  CT virtual colonoscopy 09/06/2021 trauma CT abdomen pelvis 06/14/2020. FINDINGS: Lower chest: No acute abnormality. Hepatobiliary: No focal liver abnormality. No gallstones, gallbladder wall thickening, or pericholecystic fluid. No biliary dilatation. Pancreas: No focal lesion. Normal pancreatic contour. No surrounding inflammatory changes. No main pancreatic ductal dilatation. Spleen: Normal in size without focal abnormality. Adrenals/Urinary Tract: Interval increase in size of a 2.2 cm (from 1.8 cm) left adrenal gland nodule with a density of 57 Hounsfield units. Bilateral kidneys enhance symmetrically. No hydronephrosis. No hydroureter. The urinary bladder is unremarkable. Stomach/Bowel: Stomach is within normal limits.  No evidence of large bowel wall thickening or dilatation. Dilatation of a short segment of small bowel within the left portion of the abdominal hernia (2.3 cm in caliber) with fecalized material within the lumen. No pneumatosis. Colonic diverticulosis. Vague fat stranding along a mid sigmoid diverticula (7:82). Appendix appears normal. Vascular/Lymphatic: No abdominal aorta or iliac aneurysm. Mild to moderate atherosclerotic plaque of the aorta and its branches. No abdominal, pelvic, or inguinal lymphadenopathy.  Reproductive: Uterus and bilateral adnexa are unremarkable. Other: Trace free fluid distal to the obstructed loop of small bowel. No intraperitoneal free gas. No organized fluid collection. Musculoskeletal: Large lobulated infraumbilical ventral hernia containing the transverse colon, ascending colon, appendix, and medullary of the small bowel. No suspicious lytic or blastic osseous lesions. No acute displaced fracture. Multilevel degenerative changes of the spine. IMPRESSION: 1. Short segment small bowel obstruction in the setting of a large lobulated infraumbilical ventral hernia. Dilatation of a short segment of small bowel within the left portion of the abdominal hernia (2.3 cm in caliber) with fecalized material within the lumen. Associated trace free fluid within the hernia along the obstructed small bowel. 2. Colonic diverticulosis with vague fat stranding along a focal mid sigmoid diverticula. No associated bowel wall thickening. Findings could represent developing diverticulitis. 3. Interval increase in size of a 2.2 cm (from 1.8 cm) left adrenal gland nodule. Probable adenoma. Recommend adrenal washout CT or chemical shift MRI. JACR 2017 Aug; 14(8):1038-44, JCAT 2016 Mar-Apr; 40(2):194-200, Urol J 2006 Spring; 3(2):71-4. 4.  Aortic Atherosclerosis (ICD10-I70.0). Electronically Signed   By: Morgane  Naveau M.D.   On: 11/18/2023 23:10   MM Breast Surgical Specimen Result Date: 10/30/2023 CLINICAL DATA:  Evaluate surgical specimen following lumpectomy for LEFT breast cancer. EXAM: SPECIMEN RADIOGRAPH OF THE LEFT BREAST COMPARISON:  Previous exam(s). FINDINGS: Status post excision of the LEFT breast. The radioactive seed and RIBBON biopsy clip are present and intact. IMPRESSION: Specimen radiograph of the LEFT breast. Electronically Signed   By: Reyes Phi M.D.   On: 10/30/2023 11:27    Impression/Plan: Stage IA (cT1b, N0, M0) Intermediate Grade Invasive Ductal Carcinoma of the Left Breast, ER+ / PR+ /  Her2-  Patient continues to heal from her lumpectomy. Dr. Dewey recommends radiation to reduce the risk of locoregional disease recurrence. Today we discussed the natural course of early-stage breast cancer. We highlighted the role of radiotherapy in the management and focused on the details of logistics and delivery. We reviewed the anticipated acute and late sequelae associated with radiation in this setting. The patient was encouraged to ask questions that I answered to the best of my ability. Patient expressed readiness to proceed with treatment. A consent form was reviewed and signed today.    Plan:  Patient is scheduled for CT simulation on 12/04/2023. Will plan to begin radiation approximately one week after simulation. Dr. Dewey anticipates a 4-week course of treatment.    -----------------------------------   Leeroy Due, PA-C

## 2023-12-02 ENCOUNTER — Inpatient Hospital Stay: Attending: Hematology and Oncology | Admitting: Hematology and Oncology

## 2023-12-02 VITALS — BP 137/64 | HR 67 | Temp 98.0°F | Resp 18 | Ht 64.0 in | Wt 331.6 lb

## 2023-12-02 DIAGNOSIS — Z803 Family history of malignant neoplasm of breast: Secondary | ICD-10-CM | POA: Diagnosis not present

## 2023-12-02 DIAGNOSIS — Z170 Estrogen receptor positive status [ER+]: Secondary | ICD-10-CM | POA: Insufficient documentation

## 2023-12-02 DIAGNOSIS — Z7982 Long term (current) use of aspirin: Secondary | ICD-10-CM | POA: Insufficient documentation

## 2023-12-02 DIAGNOSIS — Z86018 Personal history of other benign neoplasm: Secondary | ICD-10-CM | POA: Insufficient documentation

## 2023-12-02 DIAGNOSIS — K56609 Unspecified intestinal obstruction, unspecified as to partial versus complete obstruction: Secondary | ICD-10-CM | POA: Diagnosis not present

## 2023-12-02 DIAGNOSIS — C50212 Malignant neoplasm of upper-inner quadrant of left female breast: Secondary | ICD-10-CM | POA: Diagnosis not present

## 2023-12-02 DIAGNOSIS — E279 Disorder of adrenal gland, unspecified: Secondary | ICD-10-CM | POA: Diagnosis not present

## 2023-12-02 DIAGNOSIS — Z79899 Other long term (current) drug therapy: Secondary | ICD-10-CM | POA: Insufficient documentation

## 2023-12-02 DIAGNOSIS — E278 Other specified disorders of adrenal gland: Secondary | ICD-10-CM | POA: Diagnosis not present

## 2023-12-02 NOTE — Assessment & Plan Note (Signed)
 08/26/2023:Screening mammogram detected left breast calcifications upper central 11 o'clock position 0.8 cm, ultrasound biopsy: Grade 2 IDC ER 100%, PR 100%, Ki67 2%, HER2 -1+ by IHC, axilla negative   Treatment plan: 1.  10/30/2023: Left lumpectomy: Grade 2 IDC 1 cm, margins negative, 0/2 lymph nodes negative, ER 100%, PR 100%, HER2 1+ negative, Ki-67 2% 2. Oncotype DX: 6 (distant risk of recurrence: 3%) no role of chemo 3. Adjuvant radiation therapy followed by 4. Adjuvant antiestrogen therapy ---------------------------------------------------------------------------------------------------------------------------------- Hospitalization 11/18/2023-11/21/2023: Small bowel obstruction with incarcerated ventral hernia Discussed the Oncotype result with her in detail. Return to clinic after radiation is completed to start antiestrogen therapy

## 2023-12-02 NOTE — Progress Notes (Signed)
 Patient Care Team: Chrystal Lamarr RAMAN, MD as PCP - General (Family Medicine) Glean Stephane BROCKS, RN (Inactive) as Oncology Nurse Navigator Tyree Nanetta SAILOR, RN as Oncology Nurse Navigator Vanderbilt Ned, MD as Consulting Physician (General Surgery) Odean Potts, MD as Consulting Physician (Hematology and Oncology) Dewey Rush, MD as Consulting Physician (Radiation Oncology)  DIAGNOSIS:  Encounter Diagnoses  Name Primary?   Malignant neoplasm of upper-inner quadrant of left breast in female, estrogen receptor positive (HCC) Yes   Adrenal nodule (HCC)     SUMMARY OF ONCOLOGIC HISTORY: Oncology History  Malignant neoplasm of upper-inner quadrant of left breast in female, estrogen receptor positive (HCC)  08/26/2023 Initial Diagnosis   Screening mammogram detected left breast calcifications upper central 11 o'clock position 0.8 cm, ultrasound biopsy: Grade 2 IDC ER 100%, PR 100%, Ki67 2%, HER2 -1+ by IHC, axilla negative   09/03/2023 Cancer Staging   Staging form: Breast, AJCC 8th Edition - Clinical: Stage IA (cT1b, cN0, cM0, G2, ER+, PR+, HER2-) - Signed by Odean Potts, MD on 09/03/2023 Stage prefix: Initial diagnosis Histologic grading system: 3 grade system    Genetic Testing   [facility] CancerNext-Expanded Panel+RNA was Positive. 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 Report date is 09/15/2023.   The CancerNext-Expanded gene panel offered by [facility] and includes sequencing, rearrangement, and RNA analysis for the following 77 genes: AIP, ALK, APC, ATM, AXIN2, BAP1, BARD1, BMPR1A, BRCA1, BRCA2, BRIP1, CDC73, CDH1, CDK4, CDKN1B, CDKN2A, CEBPA, CHEK2, CTNNA1, DDX41, DICER1, ETV6, FH, FLCN, GATA2, LZTR1, MAX, MBD4, MEN1, MET, MLH1, MSH2, MSH3, MSH6, MUTYH, NF1, NF2, NTHL1, PALB2, PHOX2B, PMS2, POT1, PRKAR1A, PTCH1, PTEN, RAD51C, RAD51D, RB1, RET, RPS20, RUNX1, SDHA, SDHAF2, SDHB, SDHC, SDHD, SMAD4, SMARCA4, SMARCB1, SMARCE1, STK11, SUFU, TMEM127, TP53,  TSC1, TSC2, VHL, and WT1 (sequencing and deletion/duplication); EGFR, HOXB13, KIT, MITF, PDGFRA, POLD1, and POLE (sequencing only); EPCAM and GREM1 (deletion/duplication only).     10/30/2023 Surgery   Left lumpectomy: Grade 2 IDC 1 cm, margins negative, 0/2 lymph nodes negative, ER 100%, PR 100%, HER2 1+ negative, Ki-67 2%   11/28/2023 Cancer Staging   Staging form: Breast, AJCC 8th Edition - Pathologic: Stage IA (pT1b, pN0, cM0, G2, ER+, PR+, HER2-) - Signed by Wyatt Leeroy HERO, PA-C on 11/28/2023 Histologic grading system: 3 grade system     CHIEF COMPLIANT: Follow-up to discuss results of surgery and Oncotype DX testing  HISTORY OF PRESENT ILLNESS:  History of Present Illness Chelsea Soto is a 58 year old female with breast cancer who presents for follow-up after recent diagnostic results.  She has a recent diagnosis of breast cancer with a one-centimeter, grade two tumor. Margins and two lymph nodes are negative. Estrogen and progesterone receptors are 100% positive, HER2 is negative, and the oncotype score is six, indicating a low risk of recurrence.  She experienced a small bowel obstruction last week with severe pain, requiring hospitalization for three to four nights. An NG tube was placed, and the obstruction resolved without surgery. She has a hernia and is concerned about the effects of Zylet on her digestion but finds Linzess  helpful.  In 2022, an adrenal adenoma was identified, which has increased from 1.8 cm to 2.2 cm. She is concerned about this finding. Her mother had breast cancer, which raises her concerns about her own condition.     ALLERGIES:  has no known allergies.  MEDICATIONS:  Current Outpatient Medications  Medication Sig Dispense Refill   aspirin EC 81 MG tablet Take 81  mg by mouth daily.     cetirizine (ZYRTEC ALLERGY) 10 MG tablet 10 mg daily.     docusate sodium  (COLACE) 100 MG capsule Take 1 capsule (100 mg total) by mouth 2 (two) times daily. 10  capsule 0   lisinopril  (ZESTRIL ) 20 MG tablet Take 1 tablet (20 mg total) by mouth daily. 90 tablet 3   Multiple Vitamin (MULTIVITAMIN WITH MINERALS) TABS tablet Take 1 tablet by mouth daily.     rosuvastatin  (CRESTOR ) 10 MG tablet Take 1 tablet (10 mg total) by mouth daily. 90 tablet 0   No current facility-administered medications for this visit.    PHYSICAL EXAMINATION: ECOG PERFORMANCE STATUS: 1 - Symptomatic but completely ambulatory  Vitals:   12/02/23 0945  BP: 137/64  Pulse: 67  Resp: 18  Temp: 98 F (36.7 C)  SpO2: 99%   Filed Weights   12/02/23 0945  Weight: (!) 331 lb 9.6 oz (150.4 kg)    Physical Exam   (exam performed in the presence of a chaperone)  LABORATORY DATA:  I have reviewed the data as listed    Latest Ref Rng & Units 11/21/2023    2:51 AM 11/20/2023    5:38 AM 11/19/2023    5:26 AM  CMP  Glucose 70 - 99 mg/dL 880  870  848   BUN 6 - 20 mg/dL 13  15  14    Creatinine 0.44 - 1.00 mg/dL 9.42  9.36  9.25   Sodium 135 - 145 mmol/L 149  143  137   Potassium 3.5 - 5.1 mmol/L 3.8  3.9  4.5   Chloride 98 - 111 mmol/L 107  102  100   CO2 22 - 32 mmol/L 27  27  22    Calcium  8.9 - 10.3 mg/dL 9.1  9.0  9.2   Total Protein 6.5 - 8.1 g/dL   6.7   Total Bilirubin 0.0 - 1.2 mg/dL   0.2   Alkaline Phos 38 - 126 U/L   72   AST 15 - 41 U/L   20   ALT 0 - 44 U/L   17     Lab Results  Component Value Date   WBC 7.3 11/21/2023   HGB 11.8 (L) 11/21/2023   HCT 36.1 11/21/2023   MCV 86.2 11/21/2023   PLT 280 11/21/2023   NEUTROABS 9.5 (H) 11/19/2023    ASSESSMENT & PLAN:  Malignant neoplasm of upper-inner quadrant of left breast in female, estrogen receptor positive (HCC) 08/26/2023:Screening mammogram detected left breast calcifications upper central 11 o'clock position 0.8 cm, ultrasound biopsy: Grade 2 IDC ER 100%, PR 100%, Ki67 2%, HER2 -1+ by IHC, axilla negative   Treatment plan: 1.  10/30/2023: Left lumpectomy: Grade 2 IDC 1 cm, margins negative, 0/2  lymph nodes negative, ER 100%, PR 100%, HER2 1+ negative, Ki-67 2% 2. Oncotype DX: 6 (distant risk of recurrence: 3%) no role of chemo 3. Adjuvant radiation therapy followed by 4. Adjuvant antiestrogen therapy ---------------------------------------------------------------------------------------------------------------------------------- Hospitalization 11/18/2023-11/21/2023: Small bowel obstruction with incarcerated ventral hernia (patient wants to lose some weight before she undergoes her ventral hernia surgery with mesh placement).  She is thinking about taking Zepbound .  Discussed the Oncotype result with her in detail. Return to clinic after radiation is completed to start antiestrogen therapy  Adrenal nodule: Increased from 1.8 cm to 2.2 cm.  I sent a referral to urology for evaluation.    Orders Placed This Encounter  Procedures   Ambulatory referral to Urology  Referral Priority:   Routine    Referral Type:   Consultation    Referral Reason:   Specialty Services Required    Referred to Provider:   Renda Glance, MD    Requested Specialty:   Urology    Number of Visits Requested:   1   The patient has a good understanding of the overall plan. she agrees with it. she will call with any problems that may develop before the next visit here. Total time spent: 30 mins including face to face time and time spent for planning, charting and co-ordination of care   Naomi MARLA Chad, MD 12/02/23

## 2023-12-04 ENCOUNTER — Ambulatory Visit
Admission: RE | Admit: 2023-12-04 | Discharge: 2023-12-04 | Disposition: A | Discharge status: Home / Self Care | Source: Ambulatory Visit | Attending: Radiation Oncology | Admitting: Radiation Oncology

## 2023-12-04 DIAGNOSIS — C50212 Malignant neoplasm of upper-inner quadrant of left female breast: Secondary | ICD-10-CM | POA: Diagnosis present

## 2023-12-04 DIAGNOSIS — Z510 Encounter for antineoplastic radiation therapy: Secondary | ICD-10-CM | POA: Insufficient documentation

## 2023-12-04 DIAGNOSIS — Z170 Estrogen receptor positive status [ER+]: Secondary | ICD-10-CM | POA: Diagnosis not present

## 2023-12-05 ENCOUNTER — Encounter: Payer: Self-pay | Admitting: Hematology and Oncology

## 2023-12-10 DIAGNOSIS — Z510 Encounter for antineoplastic radiation therapy: Secondary | ICD-10-CM | POA: Diagnosis not present

## 2023-12-11 ENCOUNTER — Ambulatory Visit
Admission: RE | Admit: 2023-12-11 | Discharge: 2023-12-11 | Disposition: A | Discharge status: Home / Self Care | Source: Ambulatory Visit | Attending: Radiation Oncology | Admitting: Radiation Oncology

## 2023-12-11 ENCOUNTER — Other Ambulatory Visit: Payer: Self-pay

## 2023-12-11 ENCOUNTER — Encounter: Payer: Self-pay | Admitting: *Deleted

## 2023-12-11 DIAGNOSIS — Z170 Estrogen receptor positive status [ER+]: Secondary | ICD-10-CM

## 2023-12-11 DIAGNOSIS — Z510 Encounter for antineoplastic radiation therapy: Secondary | ICD-10-CM | POA: Diagnosis not present

## 2023-12-11 LAB — RAD ONC ARIA SESSION SUMMARY
Course Elapsed Days: 0
Plan Fractions Treated to Date: 1
Plan Prescribed Dose Per Fraction: 2.66 Gy
Plan Total Fractions Prescribed: 16
Plan Total Prescribed Dose: 42.56 Gy
Reference Point Dosage Given to Date: 2.66 Gy
Reference Point Session Dosage Given: 2.66 Gy
Session Number: 1

## 2023-12-12 ENCOUNTER — Ambulatory Visit
Admission: RE | Admit: 2023-12-12 | Discharge: 2023-12-12 | Disposition: A | Discharge status: Home / Self Care | Source: Ambulatory Visit | Attending: Radiation Oncology | Admitting: Radiation Oncology

## 2023-12-12 ENCOUNTER — Other Ambulatory Visit: Payer: Self-pay

## 2023-12-12 DIAGNOSIS — Z510 Encounter for antineoplastic radiation therapy: Secondary | ICD-10-CM | POA: Diagnosis not present

## 2023-12-12 DIAGNOSIS — C50212 Malignant neoplasm of upper-inner quadrant of left female breast: Secondary | ICD-10-CM

## 2023-12-12 LAB — RAD ONC ARIA SESSION SUMMARY
Course Elapsed Days: 1
Plan Fractions Treated to Date: 2
Plan Prescribed Dose Per Fraction: 2.66 Gy
Plan Total Fractions Prescribed: 16
Plan Total Prescribed Dose: 42.56 Gy
Reference Point Dosage Given to Date: 5.32 Gy
Reference Point Session Dosage Given: 2.66 Gy
Session Number: 2

## 2023-12-12 MED ORDER — ALRA NON-METALLIC DEODORANT (RAD-ONC)
1.0000 | Freq: Once | TOPICAL | Status: AC
  Administered 2023-12-12: 1 via TOPICAL

## 2023-12-12 MED ORDER — RADIAPLEXRX EX GEL
Freq: Once | CUTANEOUS | Status: AC
  Administered 2023-12-12: 1 via TOPICAL

## 2023-12-15 ENCOUNTER — Ambulatory Visit
Admission: RE | Admit: 2023-12-15 | Discharge: 2023-12-15 | Disposition: A | Discharge status: Home / Self Care | Source: Ambulatory Visit | Attending: Radiation Oncology

## 2023-12-15 ENCOUNTER — Other Ambulatory Visit: Payer: Self-pay

## 2023-12-15 DIAGNOSIS — Z510 Encounter for antineoplastic radiation therapy: Secondary | ICD-10-CM | POA: Diagnosis not present

## 2023-12-15 LAB — RAD ONC ARIA SESSION SUMMARY
Course Elapsed Days: 4
Plan Fractions Treated to Date: 3
Plan Prescribed Dose Per Fraction: 2.66 Gy
Plan Total Fractions Prescribed: 16
Plan Total Prescribed Dose: 42.56 Gy
Reference Point Dosage Given to Date: 7.98 Gy
Reference Point Session Dosage Given: 2.66 Gy
Session Number: 3

## 2023-12-16 ENCOUNTER — Ambulatory Visit
Admission: RE | Admit: 2023-12-16 | Discharge: 2023-12-16 | Disposition: A | Discharge status: Home / Self Care | Source: Ambulatory Visit | Attending: Radiation Oncology

## 2023-12-16 ENCOUNTER — Other Ambulatory Visit: Payer: Self-pay

## 2023-12-16 DIAGNOSIS — Z510 Encounter for antineoplastic radiation therapy: Secondary | ICD-10-CM | POA: Diagnosis not present

## 2023-12-16 LAB — RAD ONC ARIA SESSION SUMMARY
Course Elapsed Days: 5
Plan Fractions Treated to Date: 4
Plan Prescribed Dose Per Fraction: 2.66 Gy
Plan Total Fractions Prescribed: 16
Plan Total Prescribed Dose: 42.56 Gy
Reference Point Dosage Given to Date: 10.64 Gy
Reference Point Session Dosage Given: 2.66 Gy
Session Number: 4

## 2023-12-17 ENCOUNTER — Ambulatory Visit

## 2023-12-17 ENCOUNTER — Other Ambulatory Visit: Payer: Self-pay

## 2023-12-17 ENCOUNTER — Other Ambulatory Visit (HOSPITAL_BASED_OUTPATIENT_CLINIC_OR_DEPARTMENT_OTHER): Payer: Self-pay

## 2023-12-18 ENCOUNTER — Other Ambulatory Visit: Payer: Self-pay

## 2023-12-18 ENCOUNTER — Ambulatory Visit
Admission: RE | Admit: 2023-12-18 | Discharge: 2023-12-18 | Disposition: A | Discharge status: Home / Self Care | Source: Ambulatory Visit | Attending: Radiation Oncology | Admitting: Radiation Oncology

## 2023-12-18 DIAGNOSIS — Z510 Encounter for antineoplastic radiation therapy: Secondary | ICD-10-CM | POA: Diagnosis not present

## 2023-12-18 LAB — RAD ONC ARIA SESSION SUMMARY
Course Elapsed Days: 7
Plan Fractions Treated to Date: 5
Plan Prescribed Dose Per Fraction: 2.66 Gy
Plan Total Fractions Prescribed: 16
Plan Total Prescribed Dose: 42.56 Gy
Reference Point Dosage Given to Date: 13.3 Gy
Reference Point Session Dosage Given: 2.66 Gy
Session Number: 5

## 2023-12-19 ENCOUNTER — Ambulatory Visit
Admission: RE | Admit: 2023-12-19 | Discharge: 2023-12-19 | Disposition: A | Discharge status: Home / Self Care | Source: Ambulatory Visit | Attending: Radiation Oncology | Admitting: Radiation Oncology

## 2023-12-19 ENCOUNTER — Other Ambulatory Visit: Payer: Self-pay

## 2023-12-19 DIAGNOSIS — Z510 Encounter for antineoplastic radiation therapy: Secondary | ICD-10-CM | POA: Diagnosis not present

## 2023-12-19 LAB — RAD ONC ARIA SESSION SUMMARY
Course Elapsed Days: 8
Plan Fractions Treated to Date: 6
Plan Prescribed Dose Per Fraction: 2.66 Gy
Plan Total Fractions Prescribed: 16
Plan Total Prescribed Dose: 42.56 Gy
Reference Point Dosage Given to Date: 15.96 Gy
Reference Point Session Dosage Given: 2.66 Gy
Session Number: 6

## 2023-12-22 ENCOUNTER — Ambulatory Visit
Admission: RE | Admit: 2023-12-22 | Discharge: 2023-12-22 | Disposition: A | Discharge status: Home / Self Care | Source: Ambulatory Visit | Attending: Radiation Oncology | Admitting: Radiation Oncology

## 2023-12-22 ENCOUNTER — Other Ambulatory Visit: Payer: Self-pay

## 2023-12-22 DIAGNOSIS — Z510 Encounter for antineoplastic radiation therapy: Secondary | ICD-10-CM | POA: Diagnosis not present

## 2023-12-22 LAB — RAD ONC ARIA SESSION SUMMARY
Course Elapsed Days: 11
Plan Fractions Treated to Date: 7
Plan Prescribed Dose Per Fraction: 2.66 Gy
Plan Total Fractions Prescribed: 16
Plan Total Prescribed Dose: 42.56 Gy
Reference Point Dosage Given to Date: 18.62 Gy
Reference Point Session Dosage Given: 2.66 Gy
Session Number: 7

## 2023-12-23 ENCOUNTER — Telehealth: Payer: Self-pay

## 2023-12-23 ENCOUNTER — Ambulatory Visit
Admission: RE | Admit: 2023-12-23 | Discharge: 2023-12-23 | Disposition: A | Discharge status: Home / Self Care | Source: Ambulatory Visit | Attending: Radiation Oncology

## 2023-12-23 ENCOUNTER — Other Ambulatory Visit: Payer: Self-pay

## 2023-12-23 ENCOUNTER — Other Ambulatory Visit (HOSPITAL_BASED_OUTPATIENT_CLINIC_OR_DEPARTMENT_OTHER): Payer: Self-pay

## 2023-12-23 ENCOUNTER — Other Ambulatory Visit: Payer: Self-pay | Admitting: Radiation Oncology

## 2023-12-23 ENCOUNTER — Encounter: Payer: Self-pay | Admitting: Radiation Oncology

## 2023-12-23 DIAGNOSIS — Z510 Encounter for antineoplastic radiation therapy: Secondary | ICD-10-CM | POA: Diagnosis not present

## 2023-12-23 LAB — RAD ONC ARIA SESSION SUMMARY
Course Elapsed Days: 12
Plan Fractions Treated to Date: 8
Plan Prescribed Dose Per Fraction: 2.66 Gy
Plan Total Fractions Prescribed: 16
Plan Total Prescribed Dose: 42.56 Gy
Reference Point Dosage Given to Date: 21.28 Gy
Reference Point Session Dosage Given: 2.66 Gy
Session Number: 8

## 2023-12-23 MED ORDER — NYSTATIN 100000 UNIT/GM EX CREA
1.0000 | TOPICAL_CREAM | Freq: Two times a day (BID) | CUTANEOUS | 0 refills | Status: AC
  Filled 2023-12-23: qty 30, 15d supply, fill #0

## 2023-12-23 MED ORDER — FLUCONAZOLE 100 MG PO TABS
100.0000 mg | ORAL_TABLET | Freq: Every day | ORAL | 0 refills | Status: AC
  Filled 2023-12-23: qty 10, 10d supply, fill #0

## 2023-12-23 NOTE — Telephone Encounter (Signed)
 Notified the pt regarding her Cancer claim form Being completed,axed, and confirmation received. Pt copy was emailed upon request.

## 2023-12-23 NOTE — Progress Notes (Incomplete)
 Ms. Dunigan is a 58 year old female currently undergoing radiation to the left breast with breath-hold technique for a Stage IA, cT1bN0M0, Grade 2 ER/PR positive invasive ductal carcinoma.  She has received 8 of her planned 20 fractions to the left breast and is seen following her treatment out of concerns for a newly developing rash.  The rash is itchy and developed over the weekend. She hadn't been using her radiation cream until a week into treatment due to miscommunication with the educating nurse. On exam there is a macular papular rash that is erythematous in nature along the periarerolar region, lateral breast, and inframammary fold. No debris was noted and no odor was noted. She denies any recent exposures to poison ivy or changes in soaps/detergents or lotions other than her radiation lotions. She has been itching and states she's not had any other new oral medications. We discussed it was not a classic apperance of yeast but given her history of pendulous breast tissue, breast size, and impaired fasting glucose that candida would be more likely so we could begin a course of diflucan  oral tablets and topical nystatin . She declined a need for an oral antihistamine for now. If her symptoms don't improve this would likely be some type of contact deramatitis. I do not believe this is radiation related dermatitis at this time given the distribution and appearance, and early phase of her treatment. If the antifungal therapy does not improve her symptoms, we will consider a medrol dose pack. She is in agreement and will see Dr. Dewey on Thursday to determine her course.

## 2023-12-24 ENCOUNTER — Other Ambulatory Visit: Payer: Self-pay

## 2023-12-24 ENCOUNTER — Ambulatory Visit
Admission: RE | Admit: 2023-12-24 | Discharge: 2023-12-24 | Disposition: A | Discharge status: Home / Self Care | Source: Ambulatory Visit | Attending: Radiation Oncology | Admitting: Radiation Oncology

## 2023-12-24 ENCOUNTER — Ambulatory Visit

## 2023-12-24 DIAGNOSIS — Z510 Encounter for antineoplastic radiation therapy: Secondary | ICD-10-CM | POA: Diagnosis not present

## 2023-12-24 LAB — RAD ONC ARIA SESSION SUMMARY
Course Elapsed Days: 13
Plan Fractions Treated to Date: 9
Plan Prescribed Dose Per Fraction: 2.66 Gy
Plan Total Fractions Prescribed: 16
Plan Total Prescribed Dose: 42.56 Gy
Reference Point Dosage Given to Date: 23.94 Gy
Reference Point Session Dosage Given: 2.66 Gy
Session Number: 9

## 2023-12-25 ENCOUNTER — Ambulatory Visit
Admission: RE | Admit: 2023-12-25 | Discharge: 2023-12-25 | Disposition: A | Discharge status: Home / Self Care | Source: Ambulatory Visit | Attending: Radiation Oncology

## 2023-12-25 ENCOUNTER — Ambulatory Visit: Admitting: Radiation Oncology

## 2023-12-25 ENCOUNTER — Other Ambulatory Visit: Payer: Self-pay

## 2023-12-25 DIAGNOSIS — Z510 Encounter for antineoplastic radiation therapy: Secondary | ICD-10-CM | POA: Diagnosis not present

## 2023-12-25 LAB — RAD ONC ARIA SESSION SUMMARY
Course Elapsed Days: 14
Plan Fractions Treated to Date: 10
Plan Prescribed Dose Per Fraction: 2.66 Gy
Plan Total Fractions Prescribed: 16
Plan Total Prescribed Dose: 42.56 Gy
Reference Point Dosage Given to Date: 26.6 Gy
Reference Point Session Dosage Given: 2.66 Gy
Session Number: 10

## 2023-12-26 ENCOUNTER — Ambulatory Visit
Admission: RE | Admit: 2023-12-26 | Discharge: 2023-12-26 | Disposition: A | Discharge status: Home / Self Care | Source: Ambulatory Visit | Attending: Radiation Oncology | Admitting: Radiation Oncology

## 2023-12-26 ENCOUNTER — Ambulatory Visit: Admitting: Radiation Oncology

## 2023-12-26 ENCOUNTER — Other Ambulatory Visit: Payer: Self-pay

## 2023-12-26 DIAGNOSIS — Z510 Encounter for antineoplastic radiation therapy: Secondary | ICD-10-CM | POA: Diagnosis not present

## 2023-12-26 LAB — RAD ONC ARIA SESSION SUMMARY
Course Elapsed Days: 15
Plan Fractions Treated to Date: 11
Plan Prescribed Dose Per Fraction: 2.66 Gy
Plan Total Fractions Prescribed: 16
Plan Total Prescribed Dose: 42.56 Gy
Reference Point Dosage Given to Date: 29.26 Gy
Reference Point Session Dosage Given: 2.66 Gy
Session Number: 11

## 2023-12-27 ENCOUNTER — Encounter: Payer: Self-pay | Admitting: Radiation Oncology

## 2023-12-27 DIAGNOSIS — Z510 Encounter for antineoplastic radiation therapy: Secondary | ICD-10-CM | POA: Diagnosis not present

## 2023-12-29 ENCOUNTER — Other Ambulatory Visit: Payer: Self-pay | Admitting: Radiation Oncology

## 2023-12-29 ENCOUNTER — Other Ambulatory Visit: Payer: Self-pay

## 2023-12-29 ENCOUNTER — Other Ambulatory Visit (HOSPITAL_BASED_OUTPATIENT_CLINIC_OR_DEPARTMENT_OTHER): Payer: Self-pay

## 2023-12-29 ENCOUNTER — Ambulatory Visit
Admission: RE | Admit: 2023-12-29 | Discharge: 2023-12-29 | Disposition: A | Discharge status: Home / Self Care | Source: Ambulatory Visit | Attending: Radiation Oncology | Admitting: Radiation Oncology

## 2023-12-29 DIAGNOSIS — Z510 Encounter for antineoplastic radiation therapy: Secondary | ICD-10-CM | POA: Diagnosis not present

## 2023-12-29 LAB — RAD ONC ARIA SESSION SUMMARY
Course Elapsed Days: 18
Plan Fractions Treated to Date: 12
Plan Prescribed Dose Per Fraction: 2.66 Gy
Plan Total Fractions Prescribed: 16
Plan Total Prescribed Dose: 42.56 Gy
Reference Point Dosage Given to Date: 31.92 Gy
Reference Point Session Dosage Given: 2.66 Gy
Session Number: 12

## 2023-12-29 MED ORDER — METHYLPREDNISOLONE 4 MG PO TBPK
ORAL_TABLET | ORAL | 0 refills | Status: AC
  Filled 2023-12-29: qty 21, 6d supply, fill #0

## 2023-12-29 NOTE — Progress Notes (Signed)
 Noted pt's skin has not improved but worsened. We discussed discontinuing antifungal therapy and a steroid dose pack and OTC hydrocortisone therapy.

## 2023-12-30 ENCOUNTER — Ambulatory Visit

## 2023-12-31 ENCOUNTER — Ambulatory Visit
Admission: RE | Admit: 2023-12-31 | Discharge: 2023-12-31 | Disposition: A | Source: Ambulatory Visit | Attending: Radiation Oncology

## 2023-12-31 ENCOUNTER — Ambulatory Visit

## 2023-12-31 ENCOUNTER — Other Ambulatory Visit: Payer: Self-pay

## 2023-12-31 DIAGNOSIS — Z7982 Long term (current) use of aspirin: Secondary | ICD-10-CM | POA: Diagnosis not present

## 2023-12-31 DIAGNOSIS — R21 Rash and other nonspecific skin eruption: Secondary | ICD-10-CM | POA: Diagnosis not present

## 2023-12-31 DIAGNOSIS — Z510 Encounter for antineoplastic radiation therapy: Secondary | ICD-10-CM | POA: Insufficient documentation

## 2023-12-31 DIAGNOSIS — Z1721 Progesterone receptor positive status: Secondary | ICD-10-CM | POA: Diagnosis not present

## 2023-12-31 DIAGNOSIS — L299 Pruritus, unspecified: Secondary | ICD-10-CM | POA: Diagnosis not present

## 2023-12-31 DIAGNOSIS — Z79899 Other long term (current) drug therapy: Secondary | ICD-10-CM | POA: Diagnosis not present

## 2023-12-31 DIAGNOSIS — C50212 Malignant neoplasm of upper-inner quadrant of left female breast: Secondary | ICD-10-CM | POA: Diagnosis present

## 2023-12-31 DIAGNOSIS — Z170 Estrogen receptor positive status [ER+]: Secondary | ICD-10-CM | POA: Insufficient documentation

## 2023-12-31 LAB — RAD ONC ARIA SESSION SUMMARY
Course Elapsed Days: 20
Plan Fractions Treated to Date: 13
Plan Prescribed Dose Per Fraction: 2.66 Gy
Plan Total Fractions Prescribed: 16
Plan Total Prescribed Dose: 42.56 Gy
Reference Point Dosage Given to Date: 34.58 Gy
Reference Point Session Dosage Given: 2.66 Gy
Session Number: 13

## 2024-01-01 ENCOUNTER — Ambulatory Visit
Admission: RE | Admit: 2024-01-01 | Discharge: 2024-01-01 | Disposition: A | Source: Ambulatory Visit | Attending: Radiation Oncology

## 2024-01-01 ENCOUNTER — Other Ambulatory Visit: Payer: Self-pay

## 2024-01-01 DIAGNOSIS — Z510 Encounter for antineoplastic radiation therapy: Secondary | ICD-10-CM | POA: Diagnosis not present

## 2024-01-01 LAB — RAD ONC ARIA SESSION SUMMARY
Course Elapsed Days: 21
Plan Fractions Treated to Date: 14
Plan Prescribed Dose Per Fraction: 2.66 Gy
Plan Total Fractions Prescribed: 16
Plan Total Prescribed Dose: 42.56 Gy
Reference Point Dosage Given to Date: 37.24 Gy
Reference Point Session Dosage Given: 2.66 Gy
Session Number: 14

## 2024-01-02 ENCOUNTER — Ambulatory Visit

## 2024-01-02 ENCOUNTER — Ambulatory Visit
Admission: RE | Admit: 2024-01-02 | Discharge: 2024-01-02 | Disposition: A | Discharge status: Home / Self Care | Source: Ambulatory Visit | Attending: Radiation Oncology | Admitting: Radiation Oncology

## 2024-01-02 ENCOUNTER — Other Ambulatory Visit: Payer: Self-pay

## 2024-01-02 DIAGNOSIS — Z510 Encounter for antineoplastic radiation therapy: Secondary | ICD-10-CM | POA: Diagnosis not present

## 2024-01-02 LAB — RAD ONC ARIA SESSION SUMMARY
Course Elapsed Days: 22
Plan Fractions Treated to Date: 15
Plan Prescribed Dose Per Fraction: 2.66 Gy
Plan Total Fractions Prescribed: 16
Plan Total Prescribed Dose: 42.56 Gy
Reference Point Dosage Given to Date: 39.9 Gy
Reference Point Session Dosage Given: 2.66 Gy
Session Number: 15

## 2024-01-05 ENCOUNTER — Other Ambulatory Visit: Payer: Self-pay

## 2024-01-05 ENCOUNTER — Ambulatory Visit
Admission: RE | Admit: 2024-01-05 | Discharge: 2024-01-05 | Disposition: A | Discharge status: Home / Self Care | Source: Ambulatory Visit | Attending: Radiation Oncology | Admitting: Radiation Oncology

## 2024-01-05 ENCOUNTER — Ambulatory Visit

## 2024-01-05 DIAGNOSIS — Z510 Encounter for antineoplastic radiation therapy: Secondary | ICD-10-CM | POA: Diagnosis not present

## 2024-01-05 LAB — RAD ONC ARIA SESSION SUMMARY
Course Elapsed Days: 25
Plan Fractions Treated to Date: 16
Plan Prescribed Dose Per Fraction: 2.66 Gy
Plan Total Fractions Prescribed: 16
Plan Total Prescribed Dose: 42.56 Gy
Reference Point Dosage Given to Date: 42.56 Gy
Reference Point Session Dosage Given: 2.66 Gy
Session Number: 16

## 2024-01-06 ENCOUNTER — Inpatient Hospital Stay: Attending: Hematology and Oncology | Admitting: Hematology and Oncology

## 2024-01-06 ENCOUNTER — Ambulatory Visit

## 2024-01-06 ENCOUNTER — Other Ambulatory Visit: Payer: Self-pay

## 2024-01-06 ENCOUNTER — Ambulatory Visit
Admission: RE | Admit: 2024-01-06 | Discharge: 2024-01-06 | Disposition: A | Source: Ambulatory Visit | Attending: Radiation Oncology

## 2024-01-06 ENCOUNTER — Other Ambulatory Visit (HOSPITAL_BASED_OUTPATIENT_CLINIC_OR_DEPARTMENT_OTHER): Payer: Self-pay

## 2024-01-06 VITALS — BP 147/84 | HR 98 | Temp 97.8°F | Resp 18 | Wt 337.0 lb

## 2024-01-06 DIAGNOSIS — C50212 Malignant neoplasm of upper-inner quadrant of left female breast: Secondary | ICD-10-CM | POA: Diagnosis not present

## 2024-01-06 DIAGNOSIS — Z79899 Other long term (current) drug therapy: Secondary | ICD-10-CM | POA: Insufficient documentation

## 2024-01-06 DIAGNOSIS — L299 Pruritus, unspecified: Secondary | ICD-10-CM | POA: Insufficient documentation

## 2024-01-06 DIAGNOSIS — R21 Rash and other nonspecific skin eruption: Secondary | ICD-10-CM | POA: Insufficient documentation

## 2024-01-06 DIAGNOSIS — Z510 Encounter for antineoplastic radiation therapy: Secondary | ICD-10-CM | POA: Insufficient documentation

## 2024-01-06 DIAGNOSIS — Z170 Estrogen receptor positive status [ER+]: Secondary | ICD-10-CM | POA: Insufficient documentation

## 2024-01-06 DIAGNOSIS — Z7982 Long term (current) use of aspirin: Secondary | ICD-10-CM | POA: Insufficient documentation

## 2024-01-06 DIAGNOSIS — Z1721 Progesterone receptor positive status: Secondary | ICD-10-CM | POA: Insufficient documentation

## 2024-01-06 LAB — RAD ONC ARIA SESSION SUMMARY
Course Elapsed Days: 26
Plan Fractions Treated to Date: 1
Plan Prescribed Dose Per Fraction: 2 Gy
Plan Total Fractions Prescribed: 4
Plan Total Prescribed Dose: 8 Gy
Reference Point Dosage Given to Date: 2 Gy
Reference Point Session Dosage Given: 2 Gy
Session Number: 17

## 2024-01-06 MED ORDER — ANASTROZOLE 1 MG PO TABS
1.0000 mg | ORAL_TABLET | Freq: Every day | ORAL | 3 refills | Status: DC
  Filled 2024-01-06: qty 30, 30d supply, fill #0

## 2024-01-06 NOTE — Assessment & Plan Note (Signed)
 08/26/2023:Screening mammogram detected left breast calcifications upper central 11 o'clock position 0.8 cm, ultrasound biopsy: Grade 2 IDC ER 100%, PR 100%, Ki67 2%, HER2 -1+ by IHC, axilla negative    Treatment plan: 1.  10/30/2023: Left lumpectomy: Grade 2 IDC 1 cm, margins negative, 0/2 lymph nodes negative, ER 100%, PR 100%, HER2 1+ negative, Ki-67 2% 2. Oncotype DX: 6 (distant risk of recurrence: 3%) no role of chemo 3. Adjuvant radiation therapy 12/12/2023-01/12/2024 4. Adjuvant antiestrogen therapy to start 01/31/2024 ---------------------------------------------------------------------------------------------------------------------------------- Hospitalization 11/18/2023-11/21/2023: Small bowel obstruction with incarcerated ventral hernia (patient wants to lose some weight before she undergoes her ventral hernia surgery with mesh placement).  She is thinking about taking Zepbound .  Anastrozole counseling: We discussed the risks and benefits of anti-estrogen therapy with aromatase inhibitors. These include but not limited to insomnia, hot flashes, mood changes, vaginal dryness, bone density loss, and weight gain. We strongly believe that the benefits far outweigh the risks. Patient understands these risks and consented to starting treatment. Planned treatment duration is 7 years.    Adrenal nodule: Increased from 1.8 cm to 2.2 cm.  I sent a referral to urology for evaluation.  Return to clinic in 3 months for survivorship care plan visit

## 2024-01-06 NOTE — Progress Notes (Signed)
 Patient Care Team: Chrystal Lamarr RAMAN, MD as PCP - General (Family Medicine) Tyree Nanetta SAILOR, RN as Oncology Nurse Navigator Vanderbilt Ned, MD as Consulting Physician (General Surgery) Odean Potts, MD as Consulting Physician (Hematology and Oncology) Dewey Rush, MD as Consulting Physician (Radiation Oncology)  DIAGNOSIS:  Encounter Diagnosis  Name Primary?   Malignant neoplasm of upper-inner quadrant of left breast in female, estrogen receptor positive (HCC) Yes    SUMMARY OF ONCOLOGIC HISTORY: Oncology History  Malignant neoplasm of upper-inner quadrant of left breast in female, estrogen receptor positive (HCC)  08/26/2023 Initial Diagnosis   Screening mammogram detected left breast calcifications upper central 11 o'clock position 0.8 cm, ultrasound biopsy: Grade 2 IDC ER 100%, PR 100%, Ki67 2%, HER2 -1+ by IHC, axilla negative   09/03/2023 Cancer Staging   Staging form: Breast, AJCC 8th Edition - Clinical: Stage IA (cT1b, cN0, cM0, G2, ER+, PR+, HER2-) - Signed by Odean Potts, MD on 09/03/2023 Stage prefix: Initial diagnosis Histologic grading system: 3 grade system    Genetic Testing   [facility] CancerNext-Expanded Panel+RNA was Positive. 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 Report date is 09/15/2023.   The CancerNext-Expanded gene panel offered by [facility] and includes sequencing, rearrangement, and RNA analysis for the following 77 genes: AIP, ALK, APC, ATM, AXIN2, BAP1, BARD1, BMPR1A, BRCA1, BRCA2, BRIP1, CDC73, CDH1, CDK4, CDKN1B, CDKN2A, CEBPA, CHEK2, CTNNA1, DDX41, DICER1, ETV6, FH, FLCN, GATA2, LZTR1, MAX, MBD4, MEN1, MET, MLH1, MSH2, MSH3, MSH6, MUTYH, NF1, NF2, NTHL1, PALB2, PHOX2B, PMS2, POT1, PRKAR1A, PTCH1, PTEN, RAD51C, RAD51D, RB1, RET, RPS20, RUNX1, SDHA, SDHAF2, SDHB, SDHC, SDHD, SMAD4, SMARCA4, SMARCB1, SMARCE1, STK11, SUFU, TMEM127, TP53, TSC1, TSC2, VHL, and WT1 (sequencing and deletion/duplication); EGFR, HOXB13, KIT,  MITF, PDGFRA, POLD1, and POLE (sequencing only); EPCAM and GREM1 (deletion/duplication only).     10/30/2023 Surgery   Left lumpectomy: Grade 2 IDC 1 cm, margins negative, 0/2 lymph nodes negative, ER 100%, PR 100%, HER2 1+ negative, Ki-67 2%   11/28/2023 Cancer Staging   Staging form: Breast, AJCC 8th Edition - Pathologic: Stage IA (pT1b, pN0, cM0, G2, ER+, PR+, HER2-) - Signed by Wyatt Leeroy HERO, PA-C on 11/28/2023 Histologic grading system: 3 grade system     CHIEF COMPLIANT: Follow-up towards end of radiation  HISTORY OF PRESENT ILLNESS:  History of Present Illness Chelsea Soto is a 58 year old female who presents for follow-up regarding her radiation treatment and management of side effects.  By the third day of radiation therapy, she developed a significant rash, initially treated unsuccessfully with antifungal medications. A steroid reduced the inflammation, but severe pruritus continues. She has four radiation sessions remaining. Recent sessions involved different imaging and machinery, suggesting a possible change in treatment focus or technique.  She takes calcium  and vitamin D  supplements and participates in weight-bearing exercises to support bone health.     ALLERGIES:  has no known allergies.  MEDICATIONS:  Current Outpatient Medications  Medication Sig Dispense Refill   aspirin EC 81 MG tablet Take 81 mg by mouth daily.     cetirizine (ZYRTEC ALLERGY) 10 MG tablet 10 mg daily.     docusate sodium  (COLACE) 100 MG capsule Take 1 capsule (100 mg total) by mouth 2 (two) times daily. 10 capsule 0   fluconazole  (DIFLUCAN ) 100 MG tablet Take 1 tablet (100 mg total) by mouth daily. 10 tablet 0   lisinopril  (ZESTRIL ) 20 MG tablet Take 1 tablet (20 mg total) by mouth daily. 90 tablet 3  methylPREDNISolone (MEDROL) 4 MG TBPK tablet Take as directed on package 21 tablet 0   Multiple Vitamin (MULTIVITAMIN WITH MINERALS) TABS tablet Take 1 tablet by mouth daily.     nystatin   cream (MYCOSTATIN ) Apply 1 Application topically 2 (two) times daily. 30 g 0   rosuvastatin  (CRESTOR ) 10 MG tablet Take 1 tablet (10 mg total) by mouth daily. 90 tablet 0   No current facility-administered medications for this visit.    PHYSICAL EXAMINATION: ECOG PERFORMANCE STATUS: 1 - Symptomatic but completely ambulatory  Vitals:   01/06/24 1407  BP: (!) 147/84  Pulse: 98  Resp: 18  Temp: 97.8 F (36.6 C)  SpO2: 98%   Filed Weights   01/06/24 1407  Weight: (!) 337 lb (152.9 kg)      LABORATORY DATA:  I have reviewed the data as listed    Latest Ref Rng & Units 11/21/2023    2:51 AM 11/20/2023    5:38 AM 11/19/2023    5:26 AM  CMP  Glucose 70 - 99 mg/dL 880  870  848   BUN 6 - 20 mg/dL 13  15  14    Creatinine 0.44 - 1.00 mg/dL 9.42  9.36  9.25   Sodium 135 - 145 mmol/L 149  143  137   Potassium 3.5 - 5.1 mmol/L 3.8  3.9  4.5   Chloride 98 - 111 mmol/L 107  102  100   CO2 22 - 32 mmol/L 27  27  22    Calcium  8.9 - 10.3 mg/dL 9.1  9.0  9.2   Total Protein 6.5 - 8.1 g/dL   6.7   Total Bilirubin 0.0 - 1.2 mg/dL   0.2   Alkaline Phos 38 - 126 U/L   72   AST 15 - 41 U/L   20   ALT 0 - 44 U/L   17     Lab Results  Component Value Date   WBC 7.3 11/21/2023   HGB 11.8 (L) 11/21/2023   HCT 36.1 11/21/2023   MCV 86.2 11/21/2023   PLT 280 11/21/2023   NEUTROABS 9.5 (H) 11/19/2023    ASSESSMENT & PLAN:  Malignant neoplasm of upper-inner quadrant of left breast in female, estrogen receptor positive (HCC) 08/26/2023:Screening mammogram detected left breast calcifications upper central 11 o'clock position 0.8 cm, ultrasound biopsy: Grade 2 IDC ER 100%, PR 100%, Ki67 2%, HER2 -1+ by IHC, axilla negative    Treatment plan: 1.  10/30/2023: Left lumpectomy: Grade 2 IDC 1 cm, margins negative, 0/2 lymph nodes negative, ER 100%, PR 100%, HER2 1+ negative, Ki-67 2% 2. Oncotype DX: 6 (distant risk of recurrence: 3%) no role of chemo 3. Adjuvant radiation therapy  12/12/2023-01/12/2024 4. Adjuvant antiestrogen therapy to start 01/31/2024 ---------------------------------------------------------------------------------------------------------------------------------- Hospitalization 11/18/2023-11/21/2023: Small bowel obstruction with incarcerated ventral hernia (patient wants to lose some weight before she undergoes her ventral hernia surgery with mesh placement).  She is thinking about taking Zepbound .  Anastrozole counseling: We discussed the risks and benefits of anti-estrogen therapy with aromatase inhibitors. These include but not limited to insomnia, hot flashes, mood changes, vaginal dryness, bone density loss, and weight gain. We strongly believe that the benefits far outweigh the risks. Patient understands these risks and consented to starting treatment. Planned treatment duration is 7 years.    Adrenal nodule: Increased from 1.8 cm to 2.2 cm.  Referral was made to urology  Return to clinic in 3 months for survivorship care plan visit   No orders of the defined types  were placed in this encounter.  The patient has a good understanding of the overall plan. she agrees with it. she will call with any problems that may develop before the next visit here.  I personally spent a total of 30 minutes in the care of the patient today including preparing to see the patient, getting/reviewing separately obtained history, performing a medically appropriate exam/evaluation, counseling and educating, placing orders, referring and communicating with other health care professionals, documenting clinical information in the EHR, independently interpreting results, communicating results, and coordinating care.   Viinay K Niamya Vittitow, MD 01/06/24

## 2024-01-07 ENCOUNTER — Ambulatory Visit

## 2024-01-07 ENCOUNTER — Other Ambulatory Visit: Payer: Self-pay

## 2024-01-07 ENCOUNTER — Ambulatory Visit
Admission: RE | Admit: 2024-01-07 | Discharge: 2024-01-07 | Disposition: A | Discharge status: Home / Self Care | Source: Ambulatory Visit | Attending: Radiation Oncology | Admitting: Radiation Oncology

## 2024-01-07 ENCOUNTER — Ambulatory Visit
Admission: RE | Admit: 2024-01-07 | Discharge: 2024-01-07 | Disposition: A | Source: Ambulatory Visit | Attending: Radiation Oncology

## 2024-01-07 DIAGNOSIS — Z510 Encounter for antineoplastic radiation therapy: Secondary | ICD-10-CM | POA: Diagnosis not present

## 2024-01-07 LAB — RAD ONC ARIA SESSION SUMMARY
Course Elapsed Days: 27
Plan Fractions Treated to Date: 2
Plan Prescribed Dose Per Fraction: 2 Gy
Plan Total Fractions Prescribed: 4
Plan Total Prescribed Dose: 8 Gy
Reference Point Dosage Given to Date: 4 Gy
Reference Point Session Dosage Given: 2 Gy
Session Number: 18

## 2024-01-08 ENCOUNTER — Other Ambulatory Visit: Payer: Self-pay

## 2024-01-08 ENCOUNTER — Ambulatory Visit
Admission: RE | Admit: 2024-01-08 | Discharge: 2024-01-08 | Disposition: A | Source: Ambulatory Visit | Attending: Radiation Oncology

## 2024-01-08 ENCOUNTER — Ambulatory Visit

## 2024-01-08 DIAGNOSIS — Z510 Encounter for antineoplastic radiation therapy: Secondary | ICD-10-CM | POA: Diagnosis not present

## 2024-01-08 LAB — RAD ONC ARIA SESSION SUMMARY
Course Elapsed Days: 28
Plan Fractions Treated to Date: 3
Plan Prescribed Dose Per Fraction: 2 Gy
Plan Total Fractions Prescribed: 4
Plan Total Prescribed Dose: 8 Gy
Reference Point Dosage Given to Date: 6 Gy
Reference Point Session Dosage Given: 2 Gy
Session Number: 19

## 2024-01-09 ENCOUNTER — Ambulatory Visit

## 2024-01-12 ENCOUNTER — Ambulatory Visit
Admission: RE | Admit: 2024-01-12 | Discharge: 2024-01-12 | Disposition: A | Source: Ambulatory Visit | Attending: Radiation Oncology

## 2024-01-12 ENCOUNTER — Other Ambulatory Visit: Payer: Self-pay

## 2024-01-12 DIAGNOSIS — Z510 Encounter for antineoplastic radiation therapy: Secondary | ICD-10-CM | POA: Diagnosis not present

## 2024-01-12 LAB — RAD ONC ARIA SESSION SUMMARY
Course Elapsed Days: 32
Plan Fractions Treated to Date: 4
Plan Prescribed Dose Per Fraction: 2 Gy
Plan Total Fractions Prescribed: 4
Plan Total Prescribed Dose: 8 Gy
Reference Point Dosage Given to Date: 8 Gy
Reference Point Session Dosage Given: 2 Gy
Session Number: 20

## 2024-01-14 NOTE — Radiation Completion Notes (Addendum)
  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   Intent: Curative     ==========DELIVERED PLANS==========  First Treatment Date: 2023-12-11 Last Treatment Date: 2024-01-12   Plan Name: Breast_L_BH Site: Breast, Left Technique: 3D Mode: Photon Dose Per Fraction: 2.66 Gy Prescribed Dose (Delivered / Prescribed): 42.56 Gy / 42.56 Gy Prescribed Fxs (Delivered / Prescribed): 16 / 16   Plan Name: Brst_L_Bst_BH Site: Breast, Left Technique: 3D Mode: Photon Dose Per Fraction: 2 Gy Prescribed Dose (Delivered / Prescribed): 8 Gy / 8 Gy Prescribed Fxs (Delivered / Prescribed): 4 / 4     ==========ON TREATMENT VISIT DATES========== 2023-12-12, 2023-12-19, 2024-01-02, 2024-01-07    See weekly On Treatment Notes in Epic for details in the Media tab (listed as Progress notes on the On Treatment Visit Dates listed above). The patient tolerated radiation. She developed a skin rash in the area prior to completing her first few treatments. She did have treatment with steroids and antifungal therapy and the steroid therapy appeared to be the most helpful. She developed fatigue and anticipated radiation skin changes in the treatment field while the initial rash improved.   The patient will receive a call in about one month from the radiation oncology department. She will continue follow up with Dr. Gudena as well.      Donald KYM Husband, PAC

## 2024-01-19 ENCOUNTER — Other Ambulatory Visit (HOSPITAL_BASED_OUTPATIENT_CLINIC_OR_DEPARTMENT_OTHER): Payer: Self-pay

## 2024-01-19 ENCOUNTER — Other Ambulatory Visit: Payer: Self-pay

## 2024-01-19 MED ORDER — HYDROCORTISONE 2.5 % EX CREA
TOPICAL_CREAM | CUTANEOUS | 1 refills | Status: AC
  Filled 2024-01-19: qty 454, 30d supply, fill #0

## 2024-01-26 NOTE — Progress Notes (Signed)
  Radiation Oncology         [phone] ________________________________  Name: Chelsea Soto MRN: [id number]  Date of Service: 02/09/2024  DOB: 08/09/1965  Post Treatment Telephone Note  Diagnosis:   C50.219 Malignant neoplasm of upper-inner quadrant of unspecified female breast Staging on 2023-11-28: Malignant neoplasm of upper-inner quadrant of left breast in female, estrogen receptor positive (HCC) T=pT1b, N=pN0, M=cM0 Staging on 2023-09-03: Malignant neoplasm of upper-inner quadrant of left breast in female, estrogen receptor positive (HCC) T=cT1b, N=cN0, M=cM0  First Treatment Date: 2023-12-11 Last Treatment Date: 2024-01-12   Plan Name: Breast_L_BH Site: Breast, Left Technique: 3D Mode: Photon Dose Per Fraction: 2.66 Gy Prescribed Dose (Delivered / Prescribed): 42.56 Gy / 42.56 Gy Prescribed Fxs (Delivered / Prescribed): 16 / 16   Plan Name: Brst_L_Bst_BH Site: Breast, Left Technique: 3D Mode: Photon Dose Per Fraction: 2 Gy Prescribed Dose (Delivered / Prescribed): 8 Gy / 8 Gy Prescribed Fxs (Delivered / Prescribed): 4 / 4   The patient was available for call today.   Symptoms of fatigue have not improved since completing therapy.  Symptoms of skin changes have improved since completing therapy.  The patient was encouraged to avoid sun exposure in the area of prior treatment for up to one year following radiation with either sunscreen or by the style of clothing worn in the sun.  The patient has scheduled follow up with her medical oncologist Dr. Gudena for ongoing surveillance, and was encouraged to call if she develops concerns or questions regarding radiation.

## 2024-02-02 ENCOUNTER — Other Ambulatory Visit (HOSPITAL_BASED_OUTPATIENT_CLINIC_OR_DEPARTMENT_OTHER): Payer: Self-pay

## 2024-02-06 ENCOUNTER — Other Ambulatory Visit: Payer: Self-pay | Admitting: *Deleted

## 2024-02-06 MED ORDER — ANASTROZOLE 1 MG PO TABS: 1.0000 mg | ORAL_TABLET | Freq: Every day | ORAL | 3 refills | Status: AC

## 2024-02-09 ENCOUNTER — Ambulatory Visit: Payer: Self-pay | Attending: Surgery

## 2024-02-09 ENCOUNTER — Ambulatory Visit
Admission: RE | Admit: 2024-02-09 | Discharge: 2024-02-09 | Disposition: A | Discharge status: Home / Self Care | Source: Ambulatory Visit | Attending: Hematology and Oncology | Admitting: Hematology and Oncology

## 2024-02-09 VITALS — Wt 340.5 lb

## 2024-02-09 DIAGNOSIS — C50212 Malignant neoplasm of upper-inner quadrant of left female breast: Secondary | ICD-10-CM

## 2024-02-09 DIAGNOSIS — Z483 Aftercare following surgery for neoplasm: Secondary | ICD-10-CM | POA: Insufficient documentation

## 2024-02-09 NOTE — Therapy (Signed)
 OUTPATIENT PHYSICAL THERAPY SOZO SCREENING NOTE   Patient Name: Chelsea Soto MRN: [id number] DOB:1965-06-23, 58 y.o., female Today's Date: 02/09/2024  PCP: Chrystal Lamarr RAMAN, MD REFERRING PROVIDER: Vanderbilt Ned, MD   PT End of Session - 02/09/24 [phone]     Visit Number 3   # unchanged due to screen only   PT Start Time 0850    PT Stop Time 0854    PT Time Calculation (min) 4 min    Activity Tolerance Patient tolerated treatment well    Behavior During Therapy WFL for tasks assessed/performed          Past Medical History:  Diagnosis Date   Anxiety    Aortic atherosclerosis    Back pain    Breast cancer (HCC)    Constipation    Depression    Diverticulosis    Dyspnea    chronic (with exertion)   Edema of both lower extremities    GERD (gastroesophageal reflux disease)    Hiatal hernia    Hip pain    Hypercholesterolemia    Hypertension    Joint pain    Knee pain    Mood swings    Morbid obesity with BMI of 50.0-59.9, adult (HCC)    Post-menopausal bleeding    Prediabetes    Sleep apnea    Severe, on BiPAP   SOB (shortness of breath)    Umbilical hernia    Ventral hernia    Ventral hernia    Vitamin D  deficiency    Wears contact lenses    Past Surgical History:  Procedure Laterality Date   BREAST BIOPSY Left 08/26/2023   US  LT BREAST BX W LOC DEV 1ST LESION IMG BX SPEC US  GUIDE 08/26/2023 [facility] MAMMOGRAPHY   BREAST BIOPSY Left 10/28/2023   US  LT RADIOACTIVE SEED LOC 10/28/2023 [facility] MAMMOGRAPHY   BREAST LUMPECTOMY WITH RADIOACTIVE SEED AND SENTINEL LYMPH NODE BIOPSY Left 10/30/2023   Procedure: BREAST LUMPECTOMY WITH RADIOACTIVE SEED;  Surgeon: Vanderbilt Ned, MD;  Location: [facility] OR;  Service: General;  Laterality: Left;  LEFT BREAST SEED LUMPECTOMY LEFT AXILLARY SENTINEL LYMPH NODE MAPPING   COLONOSCOPY     COLONOSCOPY WITH PROPOFOL  N/A 03/20/2023   Procedure: COLONOSCOPY WITH PROPOFOL ;  Surgeon: Albertus Gordy HERO, MD;  [facility] ENDOSCOPY;   Service: Gastroenterology;  Laterality: N/A;   DILATATION & CURETTAGE/HYSTEROSCOPY WITH MYOSURE N/A 01/21/2019   Procedure: DILATATION & CURETTAGE/HYSTEROSCOPY WITH possible  MYOSURE;  Surgeon: Latisha Medford, MD;  Location: [facility] OR;  Service: Gynecology;  Laterality: N/A;   LAPAROSCOPY N/A 09/14/2020   Procedure: DIAGNOSTIC LAPAROSCOPY;  Surgeon: Teresa Lonni HERO, MD;  Location: [facility] OR;  Service: General;  Laterality: N/A;   LYSIS OF ADHESION N/A 09/14/2020   Procedure: LYSIS OF ADHESION;  Surgeon: Teresa Lonni HERO, MD;  Location: [facility] OR;  Service: General;  Laterality: N/A;   POLYPECTOMY  03/20/2023   Procedure: POLYPECTOMY;  Surgeon: Albertus Gordy HERO, MD;  Location: [facility] ENDOSCOPY;  Service: Gastroenterology;;   SENTINEL NODE BIOPSY Left 10/30/2023   Procedure: BIOPSY, LYMPH NODE, SENTINEL;  Surgeon: Vanderbilt Ned, MD;  Location: [facility] OR;  Service: General;  Laterality: Left;   VENTRAL HERNIA REPAIR N/A 09/14/2020   Procedure: OPEN REPAIR OF INCARCERATED VENTRAL HERNIA  ADULT;  Surgeon: Teresa Lonni HERO, MD;  Location: [facility] OR;  Service: General;  Laterality: N/A;   WISDOM TOOTH EXTRACTION     Patient Active Problem List   Diagnosis Date Noted   SBO (small bowel obstruction) (HCC) 11/19/2023  Small bowel obstruction (HCC) 11/19/2023   BRIP1 gene mutation positive 09/22/2023   Genetic testing 09/15/2023   Malignant neoplasm of upper-inner quadrant of left breast in female, estrogen receptor positive (HCC) 09/01/2023   OSA on CPAP 05/01/2023   Positive colorectal cancer screening using Cologuard test 03/20/2023   Benign neoplasm of transverse colon 03/20/2023   Benign neoplasm of descending colon 03/20/2023   Benign neoplasm of sigmoid colon 03/20/2023   Benign neoplasm of rectum 03/20/2023   Constipation 11/12/2022   Coronary artery calcification seen on CAT scan 08/06/2022   Mixed hyperlipidemia 08/06/2022   Primary hypertension 08/06/2022   Class 3 severe obesity with serious  comorbidity and body mass index (BMI) of 50.0 to 59.9 in adult [facility]) 08/06/2022   Prediabetes 08/06/2022   Other fatigue 08/06/2022   SOB (shortness of breath) on exertion 08/06/2022   Incarcerated hernia 09/14/2020    REFERRING DIAG: left breast cancer at risk for lymphedema  THERAPY DIAG: Aftercare following surgery for neoplasm  PERTINENT HISTORY: Patient was diagnosed on 07/08/2023 with left grade 2 invasive ductal carcinoma breast cancer. It measures 8 mm and is located in the upper inner quadrant. It is ER/PR positive and HER2 negative with a Ki67 of 2%. 10/30/23- L breast lumpectomy and SLNB 0/2  PRECAUTIONS: left UE Lymphedema risk, None  SUBJECTIVE: Pt returns for her first 3 month L-Dex screen.   PAIN:  Are you having pain? No  SOZO SCREENING: Patient was assessed today using the SOZO machine to determine the lymphedema index score. This was compared to her baseline score. It was determined that she is within the recommended range when compared to her baseline and no further action is needed at this time. She will continue SOZO screenings. These are done every 3 months for 2 years post operatively followed by every 6 months for 2 years, and then annually.   L-DEX FLOWSHEETS - 02/09/24 0800       L-DEX LYMPHEDEMA SCREENING   Measurement Type Unilateral    L-DEX MEASUREMENT EXTREMITY Upper Extremity    POSITION  Standing    DOMINANT SIDE Right    At Risk Side Left    BASELINE SCORE (UNILATERAL) 6    L-DEX SCORE (UNILATERAL) 0    VALUE CHANGE (UNILAT) -6          P: Cont every 3 month L-Dex screens until 09/2025, and then transition to 6 month screens until 09/2027.   Aden Berwyn Caldron, PTA 02/09/2024, 8:54 AM

## 2024-04-05 ENCOUNTER — Inpatient Hospital Stay: Attending: Hematology and Oncology | Admitting: Adult Health

## 2024-04-05 ENCOUNTER — Encounter: Payer: Self-pay | Admitting: Adult Health

## 2024-04-05 VITALS — BP 158/70 | HR 77 | Temp 97.6°F | Resp 17 | Ht 64.0 in | Wt 341.8 lb

## 2024-04-05 DIAGNOSIS — C50212 Malignant neoplasm of upper-inner quadrant of left female breast: Secondary | ICD-10-CM | POA: Diagnosis not present

## 2024-04-05 DIAGNOSIS — Z170 Estrogen receptor positive status [ER+]: Secondary | ICD-10-CM | POA: Diagnosis not present

## 2024-04-05 NOTE — Progress Notes (Unsigned)
 [facility] Cancer Follow up:    Chelsea Lamarr RAMAN, MD [address] [place] [address]   DIAGNOSIS: Cancer Staging  Malignant neoplasm of upper-inner quadrant of left breast in female, estrogen receptor positive (HCC) Staging form: Breast, AJCC 8th Edition - Clinical: Stage IA (cT1b, cN0, cM0, G2, ER+, PR+, HER2-) - Signed by Odean Potts, MD on 09/03/2023 Stage prefix: Initial diagnosis Histologic grading system: 3 grade system - Pathologic: Stage IA (pT1b, pN0, cM0, G2, ER+, PR+, HER2-) - Signed by Wyatt Leeroy HERO, PA-C on 11/28/2023 Histologic grading system: 3 grade system    SUMMARY OF ONCOLOGIC HISTORY: Oncology History  Malignant neoplasm of upper-inner quadrant of left breast in female, estrogen receptor positive (HCC)  08/26/2023 Initial Diagnosis   Screening mammogram detected left breast calcifications upper central 11 o'clock position 0.8 cm, ultrasound biopsy: Grade 2 IDC ER 100%, PR 100%, Ki67 2%, HER2 -1+ by IHC, axilla negative   09/03/2023 Cancer Staging   Staging form: Breast, AJCC 8th Edition - Clinical: Stage IA (cT1b, cN0, cM0, G2, ER+, PR+, HER2-) - Signed by Odean Potts, MD on 09/03/2023 Stage prefix: Initial diagnosis Histologic grading system: 3 grade system    Genetic Testing   [facility] CancerNext-Expanded Panel+RNA was Positive. 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 Report date is 09/15/2023.   The CancerNext-Expanded gene panel offered by [facility] and includes sequencing, rearrangement, and RNA analysis for the following 77 genes: AIP, ALK, APC, ATM, AXIN2, BAP1, BARD1, BMPR1A, BRCA1, BRCA2, BRIP1, CDC73, CDH1, CDK4, CDKN1B, CDKN2A, CEBPA, CHEK2, CTNNA1, DDX41, DICER1, ETV6, FH, FLCN, GATA2, LZTR1, MAX, MBD4, MEN1, MET, MLH1, MSH2, MSH3, MSH6, MUTYH, NF1, NF2, NTHL1, PALB2, PHOX2B, PMS2, POT1, PRKAR1A, PTCH1, PTEN, RAD51C, RAD51D, RB1, RET, RPS20, RUNX1, SDHA, SDHAF2, SDHB, SDHC, SDHD,  SMAD4, SMARCA4, SMARCB1, SMARCE1, STK11, SUFU, TMEM127, TP53, TSC1, TSC2, VHL, and WT1 (sequencing and deletion/duplication); EGFR, HOXB13, KIT, MITF, PDGFRA, POLD1, and POLE (sequencing only); EPCAM and GREM1 (deletion/duplication only).     10/30/2023 Surgery   Left lumpectomy: Grade 2 IDC 1 cm, margins negative, 0/2 lymph nodes negative, ER 100%, PR 100%, HER2 1+ negative, Ki-67 2%   11/28/2023 Cancer Staging   Staging form: Breast, AJCC 8th Edition - Pathologic: Stage IA (pT1b, pN0, cM0, G2, ER+, PR+, HER2-) - Signed by Wyatt Leeroy HERO, PA-C on 11/28/2023 Histologic grading system: 3 grade system   12/11/2023 - 01/12/2024 Radiation Therapy   Plan Name: Breast_L_BH Site: Breast, Left Technique: 3D Mode: Photon Dose Per Fraction: 2.66 Gy Prescribed Dose (Delivered / Prescribed): 42.56 Gy / 42.56 Gy Prescribed Fxs (Delivered / Prescribed): 16 / 16   Plan Name: Brst_L_Bst_BH Site: Breast, Left Technique: 3D Mode: Photon Dose Per Fraction: 2 Gy Prescribed Dose (Delivered / Prescribed): 8 Gy / 8 Gy Prescribed Fxs (Delivered / Prescribed): 4 / 4   01/2024 -  Anti-estrogen oral therapy   Anastrozole      CURRENT THERAPY: Anastrozole   INTERVAL HISTORY:  Discussed the use of AI scribe software for clinical note transcription with the patient, who gave verbal consent to proceed.  History of Present Illness Chelsea Soto is a 59 year old female with stage I invasive ductal carcinoma of the left breast, status post surgery and radiation, currently receiving adjuvant anastrozole , who presents for oncology follow-up due to severe joint pain and flu-like symptoms associated with aromatase inhibitor therapy.  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 The pain started after 2-3 weeks of therapy, was not present initially, and has had minimal  relief with nightly acetaminophen  and ibuprofen  for the past five nights, and she is nearly out of both. She notes her blood pressure has been trending upward since starting anastrozole .  She has persistent fatigue and generalized weakness since breast cancer surgery and radiation, predating anastrozole , with super fatigue and cognitive slowing that limit walking, activities, and travel. Aquatic exercise has provided minimal benefit.  She started Zepbound  yesterday for weight loss with a goal of losing 80 pounds before planned hernia surgery. She previously lost 40 pounds with Wegovy  but stopped due to digestive issues from her hernia. She now relies on faster meals, is cooking less due to symptoms, and has very low appetite.  She is up to date on cancer surveillance, including recent bone density testing with results pending, and has a 3D diagnostic mammogram scheduled in April. She retains her ovaries with established gynecology follow-up. She denies cough or chest pain. She notes breast swelling and skin discoloration after radiation. Genetic testing for her and her daughters was favorable, and she is considered at low risk for recurrence based on early stage and oncotype/genetic results.     Patient Active Problem List   Diagnosis Date Noted   SBO (small bowel obstruction) (HCC) 11/19/2023   Small bowel obstruction (HCC) 11/19/2023   BRIP1 gene mutation positive 09/22/2023   Genetic testing 09/15/2023   Malignant neoplasm of upper-inner quadrant of left breast in female, estrogen receptor positive (HCC) 09/01/2023   OSA on CPAP 05/01/2023   Positive colorectal cancer screening using Cologuard test 03/20/2023   Benign neoplasm of transverse colon 03/20/2023   Benign neoplasm of descending colon 03/20/2023   Benign neoplasm of sigmoid colon 03/20/2023   Benign neoplasm of rectum 03/20/2023   Constipation 11/12/2022   Coronary artery calcification seen on CAT scan 08/06/2022   Mixed  hyperlipidemia 08/06/2022   Primary hypertension 08/06/2022   Class 3 severe obesity with serious comorbidity and body mass index (BMI) of 50.0 to 59.9 in adult [facility]) 08/06/2022   Prediabetes 08/06/2022   Other fatigue 08/06/2022   SOB (shortness of breath) on exertion 08/06/2022   Incarcerated hernia 09/14/2020    has no known allergies.  MEDICAL HISTORY: Past Medical History:  Diagnosis Date   Anxiety    Aortic atherosclerosis    Back pain    Breast cancer (HCC)    Constipation    Depression    Diverticulosis    Dyspnea    chronic (with exertion)   Edema of both lower extremities    GERD (gastroesophageal reflux disease)    Hiatal hernia    Hip pain    Hypercholesterolemia    Hypertension    Joint pain    Knee pain    Mood swings    Morbid obesity with BMI of 50.0-59.9, adult (HCC)    Post-menopausal bleeding    Prediabetes    Sleep apnea    Severe, on BiPAP   SOB (shortness of breath)    Umbilical hernia    Ventral hernia    Ventral hernia    Vitamin D  deficiency    Wears contact lenses     SURGICAL HISTORY: Past Surgical History:  Procedure Laterality Date   BREAST BIOPSY Left 08/26/2023   US  LT BREAST BX W LOC DEV 1ST LESION IMG BX SPEC US  GUIDE 08/26/2023 [facility] MAMMOGRAP[facility]   BREAST  BIOPSY Left 10/28/2023   US  LT RADIOACTIVE SEED LOC 10/28/2023 [facility]AMMOGRAPHY   BREAST LUMPECTOMY WITH RADIOACTIVE SEED AND SENTINEL LYMPH NODE BIOPSY Left 10/30/2023   Procedure: BREAST LUMPECTOMY WITH RADIOACTIVE SEED;  Surgeon: Vanderbilt Ned, MD;  Location: [facility] OR;  Service: General;  Laterality: Left;  LEFT BREAST SEED LUMPECTOMY LEFT AXILLARY SENTINEL LYMPH NODE MAPPING   COLONOSCOPY     COLONOSCOPY WITH PROPOFOL  N/A 03/20/2023   Procedure: COLONOSCOPY WITH PROPOFOL ;  Surgeon: Albertus Gordy HERO, MD;  Location: [facility] ENDOSCOPY;  Service: Gastroenterology;  Laterality: N/A;   DILATATION & CURETTAGE/HYSTEROSCOPY WITH MYOSURE N/A 01/21/2019   Procedure: DILATATION &  CURETTAGE/HYSTEROSCOPY WITH possible  MYOSURE;  Surgeon: Latisha Medford, MD;  Location: [facility] OR;  Service: Gynecology;  Laterality: N/A;   LAPAROSCOPY N/A 09/14/2020   Procedure: DIAGNOSTIC LAPAROSCOPY;  Surgeon: Teresa Lonni HERO, MD;  Location: [facility] OR;  Service: General;  Laterality: N/A;   LYSIS OF ADHESION N/A 09/14/2020   Procedure: LYSIS OF ADHESION;  Surgeon: Teresa Lonni HERO, MD;  Location: [facility] OR;  Service: General;  Laterality: N/A;   POLYPECTOMY  03/20/2023   Procedure: POLYPECTOMY;  Surgeon: Albertus Gordy HERO, MD;  Location: [facility] ENDOSCOPY;  Service: Gastroenterology;;   SENTINEL NODE BIOPSY Left 10/30/2023   Procedure: BIOPSY, LYMPH NODE, SENTINEL;  Surgeon: Vanderbilt Ned, MD;  Location: [facility] OR;  Service: General;  Laterality: Left;   VENTRAL HERNIA REPAIR N/A 09/14/2020   Procedure: OPEN REPAIR OF INCARCERATED VENTRAL HERNIA  ADULT;  Surgeon: Teresa Lonni HERO, MD;  Location: [facility] OR;  Service: General;  Laterality: N/A;   WISDOM TOOTH EXTRACTION      SOCIAL HISTORY: Social History   Socioeconomic History   Marital status: Married    Spouse name: Dwayne   Number of children: Not on file   Years of education: Not on file   Highest education level: Not on file  Occupational History   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[employer]  Tobacco Use   Smoking status: Never   Smokeless tobacco: Never  Vaping Use   Vaping status: Never Used  Substance and Sexual Activity   Alcohol use: Yes    Comment: rare   Drug use: Never   Sexual activity: Yes  Other Topics Concern   Not on file  Social History Narrative   Not on file   Social Drivers of Health   Tobacco Use: Low Risk (04/05/2024)   Patient History    Smoking Tobacco Use: Never    Smokeless Tobacco Use: Never    Passive Exposure: Not on file  Financial Resource Strain: Low Risk (04/05/2024)   Overall Financial Resource Strain (CARDIA)    Difficulty of Paying Living Expenses: Not hard at all  Food Insecurity: No  Food Insecurity (04/05/2024)   Epic    Worried About Radiation Protection Practitioner of Food in the Last Year: Never true    Ran Out of Food in the Last Year: Never true  Transportation Needs: No Transportation Needs (04/05/2024)   Epic    Lack of Transportation (Medical): No    Lack of Transportation (Non-Medical): No  Physical Activity: Insufficiently Active (04/05/2024)   Exercise Vital Sign    Days of Exercise per Week: 2 days    Minutes of Exercise per Session: 30 min  Stress: No Stress Concern Present (04/05/2024)   [employer] of Occupational Health - Occupational Stress Questionnaire    Feeling of Stress: Only a little  Social Connections: Moderately Integrated (04/05/2024)   Social Connection and Isolation Panel  Frequency of Communication with Friends and Family: More than three times a week    Frequency of Social Gatherings with Friends and Family: More than three times a week    Attends Religious Services: Never    Database Administrator or Organizations: Yes    Attends Banker Meetings: 1 to 4 times per year    Marital Status: Married  Catering Manager Violence: Not At Risk (04/05/2024)   Epic    Fear of Current or Ex-Partner: No    Emotionally Abused: No    Physically Abused: No    Sexually Abused: No  Depression (PHQ2-9): Low Risk (04/05/2024)   Depression (PHQ2-9)    PHQ-2 Score: 0  Alcohol Screen: Low Risk (04/05/2024)   Alcohol Screen    Last Alcohol Screening Score (AUDIT): 0  Housing: Unknown (04/05/2024)   Epic    Unable to Pay for Housing in the Last Year: No    Number of Times Moved in the Last Year: Not on file    Homeless in the Last Year: No  Utilities: Not At Risk (04/05/2024)   Epic    Threatened with loss of utilities: No  Health Literacy: Adequate Health Literacy (04/05/2024)   B1300 Health Literacy    Frequency of need for help with medical instructions: Never    FAMILY HISTORY: Family History  Problem Relation Age of Onset   Breast cancer Mother 56    Colon polyps Mother    Diabetes Mother    Hypertension Mother    Heart disease Mother    Depression Mother    Anxiety disorder Mother    Obesity Mother    CAD Father    Hypertension Father    Hyperlipidemia Father    Heart disease Father    Sleep apnea Father    Other Sister        BRIP1+   Stroke Sister    Colon cancer Maternal Grandmother 51   Breast cancer Maternal Grandmother 59   Cancer Maternal Grandfather        unknown type   Colon cancer Maternal Uncle 45   Esophageal cancer Neg Hx    Rectal cancer Neg Hx    Stomach cancer Neg Hx     Review of Systems  Constitutional:  Negative for appetite change, chills, fatigue, fever and unexpected weight change.  HENT:   Negative for hearing loss, lump/mass and trouble swallowing.   Eyes:  Negative for eye problems and icterus.  Respiratory:  Negative for chest tightness, cough and shortness of breath.   Cardiovascular:  Negative for chest pain, leg swelling and palpitations.  Gastrointestinal:  Negative for abdominal distention, abdominal pain, constipation, diarrhea, nausea and vomiting.  Endocrine: Negative for hot flashes.  Genitourinary:  Negative for difficulty urinating.   Musculoskeletal:  Negative for arthralgias.  Skin:  Negative for itching and rash.  Neurological:  Negative for dizziness, extremity weakness, headaches and numbness.  Hematological:  Negative for adenopathy. Does not bruise/bleed easily.  Psychiatric/Behavioral:  Negative for depression. The patient is not nervous/anxious.       PHYSICAL EXAMINATION   Onc Performance Status - 04/05/24 1337       ECOG Perf Status   ECOG Perf Status Restricted in physically strenuous activity but ambulatory and able to carry out work of a light or sedentary nature, e.g., light house work, office work      KPS SCALE   KPS % SCORE Able to carry on normal activity, minor s/s of disease  Vitals:   04/05/24 1326 04/05/24 1330  BP: (!) 178/80 (!)  158/70  Pulse: 77   Resp: 17   Temp: 97.6 F (36.4 C)   SpO2: 97%     Physical Exam Constitutional:      General: She is not in acute distress.    Appearance: Normal appearance. She is not toxic-appearing.  HENT:     Head: Normocephalic and atraumatic.     Mouth/Throat:     Mouth: Mucous membranes are moist.     Pharynx: Oropharynx is clear. No oropharyngeal exudate or posterior oropharyngeal erythema.  Eyes:     General: No scleral icterus. Cardiovascular:     Rate and Rhythm: Normal rate and regular rhythm.     Pulses: Normal pulses.     Heart sounds: Normal heart sounds.  Pulmonary:     Effort: Pulmonary effort is normal.     Breath sounds: Normal breath sounds.  Chest:     Comments: Left breast s/p lumpectomy and radiation, no sign of local recurrence; right breast benign Abdominal:     General: Abdomen is flat. Bowel sounds are normal. There is no distension.     Palpations: Abdomen is soft.     Tenderness: There is no abdominal tenderness.  Musculoskeletal:        General: No swelling.     Cervical back: Neck supple.  Lymphadenopathy:     Cervical: No cervical adenopathy.     Upper Body:     Right upper body: No supraclavicular or axillary adenopathy.     Left upper body: No supraclavicular or axillary adenopathy.  Skin:    General: Skin is warm and dry.     Findings: No rash.  Neurological:     General: No focal deficit present.     Mental Status: She is alert.  Psychiatric:        Mood and Affect: Mood normal.        Behavior: Behavior normal.       ASSESSMENT and THERAPY PLAN:    Assessment and Plan Assessment & Plan Estrogen receptor positive invasive ductal carcinoma of the upper-inner quadrant of the left breast, status post surgery and radiation Low recurrence risk due to early stage and favorable genetics. Under surveillance with adjuvant endocrine therapy. Late-term radiation side effects reviewed with low complication risk. - Ordered  circulating tumor DNA (Guardant Reveal) testing every six months. - Confirmed next 3D diagnostic mammogram due in April. - Instructed to continue regular primary care and age-appropriate cancer screenings. - Advised to report new symptoms related to radiation side effects. - Scheduled oncology follow-up in six months.  Adverse effects of aromatase inhibitor therapy Severe musculoskeletal pain, flu-like symptoms, and impaired sleep from anastrozole , affecting quality of life. Symptoms only partially responsive to acetaminophen  and ibuprofen . Discussed impact on blood pressure and health. Reviewed alternative aromatase inhibitors. - Recommended 1-2 week anastrozole  holiday to assess symptom resolution. - Advised considering dosing time change if therapy resumed. - Discussed switching to different aromatase inhibitor if symptoms persist. - Instructed to communicate via MyChart regarding symptom changes. - Reviewed common aromatase inhibitor side effects.  Cancer-related fatigue Persistent fatigue and weakness post-treatment, affecting daily activities. Common post-cancer treatment sequelae with expected gradual improvement. - Provided reassurance on expected fatigue course. - Encouraged continued physical activity, including aquatic exercise. - Advised to monitor for improvement and report worsening symptoms.  Obesity Obesity with active weight loss efforts using Zepbound  and dietary changes. Weight loss needed for hernia surgery. Previous weight  loss with Wegovy  discontinued due to side effects. - Encouraged continuation of Zepbound  for weight loss. - Provided dietary counseling resources and encouraged minimally processed diet. - Reinforced importance of weight loss for health and surgical eligibility. - Advised to continue regular exercise as tolerated.  RTC in 6 months for labs and f/u with Dr. Odean Mammogram in 06/2024    All questions were answered. The patient knows to call the  clinic with any problems, questions or concerns. We can certainly see the patient much sooner if necessary.  Total encounter time:40 minutes*in face-to-face visit time, chart review, lab review, care coordination, order entry, and documentation of the encounter time.    Morna Kendall, NP 04/05/2024 1:50 PM Medical Oncology and Hematology [facility] [address] [place], [place] [address] Tel. [phone]    Fax. [phone]  *Total Encounter Time as defined by the Centers for Medicare and Medicaid Services includes, in addition to the face-to-face time of a patient visit (documented in the note above) non-face-to-face time: obtaining and reviewing outside history, ordering and reviewing medications, tests or procedures, care coordination (communications with other health care professionals or caregivers) and documentation in the medical record.

## 2024-04-09 ENCOUNTER — Encounter: Payer: Self-pay | Admitting: Obstetrics and Gynecology

## 2024-04-14 ENCOUNTER — Other Ambulatory Visit (HOSPITAL_BASED_OUTPATIENT_CLINIC_OR_DEPARTMENT_OTHER): Payer: Self-pay

## 2024-04-14 MED ORDER — FLUZONE 0.5 ML IM SUSY
0.5000 mL | PREFILLED_SYRINGE | Freq: Once | INTRAMUSCULAR | 0 refills | Status: AC
  Filled 2024-04-14: qty 0.5, 1d supply, fill #0

## 2024-04-22 ENCOUNTER — Other Ambulatory Visit (HOSPITAL_BASED_OUTPATIENT_CLINIC_OR_DEPARTMENT_OTHER): Payer: Self-pay

## 2024-04-22 MED ORDER — ZEPBOUND 5 MG/0.5ML ~~LOC~~ SOAJ
5.0000 mg | SUBCUTANEOUS | 0 refills | Status: DC
  Filled 2024-04-22: qty 2, 28d supply, fill #0

## 2024-04-22 MED ORDER — ZEPBOUND 5 MG/0.5ML ~~LOC~~ SOAJ
5.0000 mg | SUBCUTANEOUS | 0 refills | Status: AC
  Filled 2024-04-22: qty 2, 28d supply, fill #0

## 2024-05-10 ENCOUNTER — Ambulatory Visit: Attending: Surgery

## 2024-10-21 ENCOUNTER — Inpatient Hospital Stay: Attending: Hematology and Oncology | Admitting: Hematology and Oncology
